# Patient Record
Sex: Male | Born: 1990
Health system: Southern US, Community
[De-identification: ages and names within clinical notes are randomized; demographics above are authoritative.]

## PROBLEM LIST (undated history)

## (undated) DIAGNOSIS — F1410 Cocaine abuse, uncomplicated: Secondary | ICD-10-CM

## (undated) DIAGNOSIS — F32A Depression, unspecified: Secondary | ICD-10-CM

## (undated) DIAGNOSIS — F329 Major depressive disorder, single episode, unspecified: Secondary | ICD-10-CM

## (undated) DIAGNOSIS — F419 Anxiety disorder, unspecified: Secondary | ICD-10-CM

## (undated) DIAGNOSIS — Z973 Presence of spectacles and contact lenses: Secondary | ICD-10-CM

## (undated) DIAGNOSIS — I639 Cerebral infarction, unspecified: Secondary | ICD-10-CM

## (undated) DIAGNOSIS — R519 Headache, unspecified: Secondary | ICD-10-CM

## (undated) DIAGNOSIS — K219 Gastro-esophageal reflux disease without esophagitis: Secondary | ICD-10-CM

## (undated) DIAGNOSIS — I729 Aneurysm of unspecified site: Secondary | ICD-10-CM

## (undated) HISTORY — PX: TOOTH EXTRACTION: SUR596

## (undated) HISTORY — PX: RECTAL SURGERY: SHX760

---
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
## 2012-10-25 ENCOUNTER — Emergency Department (HOSPITAL_COMMUNITY)
Admission: EM | Admit: 2012-10-25 | Discharge: 2012-10-25 | Disposition: A | Discharge status: Home / Self Care | Payer: Managed Care, Other (non HMO) | Attending: Emergency Medicine | Admitting: Emergency Medicine

## 2012-10-25 ENCOUNTER — Encounter (HOSPITAL_COMMUNITY): Payer: Self-pay | Admitting: Emergency Medicine

## 2012-10-25 DIAGNOSIS — F17200 Nicotine dependence, unspecified, uncomplicated: Secondary | ICD-10-CM | POA: Insufficient documentation

## 2012-10-25 DIAGNOSIS — H5789 Other specified disorders of eye and adnexa: Secondary | ICD-10-CM | POA: Insufficient documentation

## 2012-10-25 DIAGNOSIS — R0981 Nasal congestion: Secondary | ICD-10-CM

## 2012-10-25 DIAGNOSIS — J3489 Other specified disorders of nose and nasal sinuses: Secondary | ICD-10-CM | POA: Insufficient documentation

## 2012-10-25 DIAGNOSIS — H579 Unspecified disorder of eye and adnexa: Secondary | ICD-10-CM | POA: Insufficient documentation

## 2012-10-25 MED ORDER — CROMOLYN SODIUM 5.2 MG/ACT NA AERS: 1.0000 | INHALATION_SPRAY | Freq: Four times a day (QID) | NASAL | Status: DC

## 2012-10-25 MED ORDER — CETIRIZINE HCL 10 MG PO TABS: 10.0000 mg | ORAL_TABLET | Freq: Every day | ORAL | Status: DC

## 2012-10-25 NOTE — Progress Notes (Signed)
[facility] ED CM noted patient with insurance coverage but no PCP listed.  Spoke with patient who confirms no PCP. CM spoke with patient on how to obtain an in network PCP with insurance coverage via the customer service number on back of insurance card or web site.  Reinforced importance of following up with PCP as outpatient.

## 2012-10-25 NOTE — ED Provider Notes (Signed)
History     CSN: [id number]  Arrival date & time 10/25/12  1249   First MD Initiated Contact with Patient 10/25/12 1350      Chief Complaint  Patient presents with  . Nasal Congestion    (Consider location/radiation/quality/duration/timing/severity/associated sxs/prior treatment) HPI  Emergency chief complaint of nasal congestion and for the past 2 days.  This is contradictory to nursing to see [place] notes.  Patient states that he has had nasal congestion and blockage with intermittent rhinorrhea.  He denies any sinus pressure or pain.  He denies any fevers, chills, nausea, vomiting arthralgias or myalgias.  Patient denies cough, sore throat, ear pain or discharge.  He has had some injury itchy watery eyes.  Denies any difficulty breathing or swallowing, wheezing or asthma.  She mentions that he would like screening for STDs, however he denies any symptoms at this time.  I discussed with the patient may followup at the health Department for STD check.  History reviewed. No pertinent past medical history.  Past Surgical History  Procedure Laterality Date  . Rectal surgery    . Tooth extraction      No family history on file.  History  Substance Use Topics  . Smoking status: Current Every Day Smoker -- 1.00 packs/day    Types: Cigarettes  . Smokeless tobacco: Not on file  . Alcohol Use: Yes      Review of Systems  Constitutional: Negative for fever and chills.  HENT: Positive for congestion and rhinorrhea. Negative for nosebleeds, trouble swallowing, neck pain, voice change, postnasal drip and sinus pressure.   Eyes: Positive for redness and itching. Negative for photophobia, pain, discharge and visual disturbance.  Respiratory: Negative for cough, shortness of breath and wheezing.   Cardiovascular: Negative for chest pain.  Gastrointestinal: Negative for nausea, vomiting, abdominal pain and diarrhea.  Musculoskeletal: Negative for arthralgias and gait problem.     Allergies  Review of patient's allergies indicates no known allergies.  Home Medications  No current outpatient prescriptions on file.  BP 127/73  Pulse 92  Temp(Src) 98.8 F (37.1 C) (Oral)  Resp 24  SpO2 99%  Physical Exam  Nursing note and vitals reviewed. Constitutional: He appears well-developed and well-nourished. No distress.  HENT:  Head: Normocephalic and atraumatic. No trismus in the jaw.  Nose: Rhinorrhea present. No mucosal edema or nasal deformity. No epistaxis.  No foreign bodies.  Mouth/Throat: Uvula is midline. Normal dentition. No edematous or dental caries.  Eyes: Conjunctivae and EOM are normal. Pupils are equal, round, and reactive to light. No scleral icterus.  Neck: Normal range of motion. Neck supple.  Cardiovascular: Normal rate, regular rhythm and normal heart sounds.   Pulmonary/Chest: Effort normal and breath sounds normal. No respiratory distress.  Abdominal: Soft. There is no tenderness.  Musculoskeletal: He exhibits no edema.  Neurological: He is alert.  Skin: Skin is warm and dry. He is not diaphoretic.  Psychiatric: His behavior is normal.    ED Course  Procedures (including critical care time)  Labs Reviewed - No data to display No results found.   1. Nasal congestion       MDM  2:17 PM BP 127/73  Pulse 92  Temp(Src) 98.8 F (37.1 C) (Oral)  Resp 24  SpO2 99% Patient with nasal congestion.  No other complaints at this time.  He does when testing for STD however has no penile discharge, testicular pain,, dysuria.  I've advised the patient he should followup at the health Department for  STD testing.  Patient understands and agrees with plan.  I will treat him for nasal allergies.  Patient may followup with PCP for further evaluation.  Cautions discussed.  The patient appears reasonably screened and/or stabilized for discharge and I doubt any other medical condition or other [facility] requiring further screening, evaluation, or  treatment in the ED at this time prior to discharge.          Arthor Captain, PA-C 10/25/12 1419

## 2012-10-25 NOTE — ED Notes (Signed)
Pt complains of nasal congestion x 1 week. Pt denies coughing anything up at this time. Pt denies fever, denies nausea or vomiting at this time.

## 2012-10-25 NOTE — ED Provider Notes (Signed)
Medical screening examination/treatment/procedure(s) were performed by non-physician practitioner and as supervising physician I was immediately available for consultation/collaboration.   Kc Sedlak L Stanislaus Kaltenbach, MD 10/25/12 1537 

## 2014-10-15 ENCOUNTER — Emergency Department (HOSPITAL_COMMUNITY)
Admission: EM | Admit: 2014-10-15 | Discharge: 2014-10-15 | Disposition: A | Discharge status: Home / Self Care | Payer: BLUE CROSS/BLUE SHIELD | Attending: Emergency Medicine | Admitting: Emergency Medicine

## 2014-10-15 ENCOUNTER — Encounter (HOSPITAL_COMMUNITY): Payer: Self-pay | Admitting: *Deleted

## 2014-10-15 ENCOUNTER — Emergency Department (HOSPITAL_COMMUNITY): Payer: BLUE CROSS/BLUE SHIELD

## 2014-10-15 DIAGNOSIS — Z720 Tobacco use: Secondary | ICD-10-CM | POA: Diagnosis not present

## 2014-10-15 DIAGNOSIS — J029 Acute pharyngitis, unspecified: Secondary | ICD-10-CM | POA: Insufficient documentation

## 2014-10-15 LAB — I-STAT CHEM 8, ED
BUN: 6 mg/dL (ref 6–23)
CHLORIDE: 101 mmol/L (ref 96–112)
Calcium, Ion: 1.14 mmol/L (ref 1.12–1.23)
Creatinine, Ser: 0.8 mg/dL (ref 0.50–1.35)
GLUCOSE: 95 mg/dL (ref 70–99)
HEMATOCRIT: 48 % (ref 39.0–52.0)
Hemoglobin: 16.3 g/dL (ref 13.0–17.0)
POTASSIUM: 3.8 mmol/L (ref 3.5–5.1)
Sodium: 141 mmol/L (ref 135–145)
TCO2: 26 mmol/L (ref 0–100)

## 2014-10-15 LAB — RAPID STREP SCREEN (MED CTR MEBANE ONLY): STREPTOCOCCUS, GROUP A SCREEN (DIRECT): NEGATIVE

## 2014-10-15 MED ORDER — CETIRIZINE HCL 10 MG PO TABS: 10.0000 mg | ORAL_TABLET | Freq: Every day | ORAL | Status: DC

## 2014-10-15 MED ORDER — IOHEXOL 300 MG/ML  SOLN
100.0000 mL | Freq: Once | INTRAMUSCULAR | Status: AC | PRN
  Administered 2014-10-15: 100 mL via INTRAVENOUS
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
## 2014-10-15 NOTE — Discharge Instructions (Signed)

## 2014-10-17 LAB — CULTURE, GROUP A STREP: Strep A Culture: NEGATIVE

## 2015-05-20 ENCOUNTER — Emergency Department (HOSPITAL_COMMUNITY)
Admission: EM | Admit: 2015-05-20 | Discharge: 2015-05-20 | Disposition: A | Discharge status: Home / Self Care | Payer: Managed Care, Other (non HMO) | Attending: Emergency Medicine | Admitting: Emergency Medicine

## 2015-05-20 ENCOUNTER — Encounter (HOSPITAL_COMMUNITY): Payer: Self-pay | Admitting: Emergency Medicine

## 2015-05-20 DIAGNOSIS — B029 Zoster without complications: Secondary | ICD-10-CM

## 2015-05-20 DIAGNOSIS — Z79899 Other long term (current) drug therapy: Secondary | ICD-10-CM | POA: Insufficient documentation

## 2015-05-20 DIAGNOSIS — F17210 Nicotine dependence, cigarettes, uncomplicated: Secondary | ICD-10-CM | POA: Insufficient documentation

## 2015-05-20 MED ORDER — VALACYCLOVIR HCL 1 G PO TABS: 1000.0000 mg | ORAL_TABLET | Freq: Three times a day (TID) | ORAL | Status: AC

## 2015-05-20 MED ORDER — TETRACAINE HCL 0.5 % OP SOLN
2.0000 [drp] | Freq: Once | OPHTHALMIC | Status: AC
  Administered 2015-05-20: 2 [drp] via OPHTHALMIC
  Filled 2015-05-20: qty 2

## 2015-05-20 MED ORDER — FLUORESCEIN SODIUM 1 MG OP STRP
1.0000 | ORAL_STRIP | Freq: Once | OPHTHALMIC | Status: AC
  Administered 2015-05-20: 1 via OPHTHALMIC
  Filled 2015-05-20: qty 1

## 2015-05-20 NOTE — ED Notes (Signed)
Right eye swelling since last night, no other complaints, A/O X4, ambulatory and in NAD

## 2015-05-20 NOTE — Discharge Instructions (Signed)

## 2015-05-20 NOTE — ED Provider Notes (Signed)
CSN: [id number]     Arrival date & time 05/20/15  1131 History  By signing my name below, I, Jacob Holder, attest that this documentation has been prepared under the direction and in the presence of Eyvonne Mechanic, PA-C Electronically Signed: Charline Bills, ED Scribe 05/20/2015 at 4:27 PM.   Chief Complaint  Patient presents with  . Eye Pain   The history is provided by the patient. No language interpreter was used.   HPI Comments: Jacob Holder is a 24 y.o. male who presents to the Emergency Department complaining of sudden onset of worsening right upper eyelid swelling onset yesterday morning. Pt initially noticed swelling to his right eyelid and an area to his right forehead upon waking 2 mornings ago. No treatments tried PTA. Pt denies associated itching or pain to the areas, fever, chills, nausea, vomiting. He reports h/o eczema and a h/o shingles on his ribs as a child.  History reviewed. No pertinent past medical history.  Past Surgical History  Procedure Laterality Date  . Rectal surgery    . Tooth extraction     No family history on file. Social History  Substance Use Topics  . Smoking status: Current Every Day Smoker -- 0.50 packs/day    Types: Cigarettes  . Smokeless tobacco: None  . Alcohol Use: Yes    Review of Systems  All other systems reviewed and are negative.  Allergies  Review of patient's allergies indicates no known allergies.  Home Medications   Prior to Admission medications   Medication Sig Start Date End Date Taking? Authorizing Provider  cetirizine (ZYRTEC ALLERGY) 10 MG tablet Take 1 tablet (10 mg total) by mouth daily. 10/15/14   Roxy Horseman, PA-C  cromolyn (NASALCROM) 5.2 MG/ACT nasal spray Place 1 spray into the nose 4 (four) times daily. 10/25/12   Arthor Captain, PA-C  valACYclovir (VALTREX) 1000 MG tablet Take 1 tablet (1,000 mg total) by mouth 3 (three) times daily. 05/20/15 06/03/15  Carlean Crowl, PA-C   BP 130/72 mmHg  Pulse 92   Temp(Src) 98.1 F (36.7 C) (Oral)  Resp 18  Ht  (1.803 m)  Wt 183 lb (83.008 kg)  BMI 25.53 kg/m2  SpO2 99% Physical Exam  Constitutional: He is oriented to person, place, and time. He appears well-developed and well-nourished. No distress.  HENT:  Head: Normocephalic and atraumatic.  Right Ear: Tympanic membrane normal.  Left Ear: Tympanic membrane normal.  No vesicle, facial droop, loss of sensation. No rash noted to the nose. Vision equal bilateral, no painful ocular movements, no obvious redness, swelling, lacrimal discharge   Eyes: Conjunctivae and EOM are normal.  Erythematous rash to the R eyelid and forehead. No other lesions noted. Specifically no lesions noted on the nose. Vision normal, bilateral.   Neck: Neck supple. No tracheal deviation present.  Cardiovascular: Normal rate.   Pulmonary/Chest: Effort normal. No respiratory distress.  Musculoskeletal: Normal range of motion.  Neurological: He is alert and oriented to person, place, and time.  Skin: Skin is warm and dry.  Psychiatric: He has a normal mood and affect. His behavior is normal.  Nursing note and vitals reviewed.  ED Course  Procedures (including critical care time)   DIAGNOSTIC STUDIES: Oxygen Saturation is 99% on RA, normal by my interpretation.    COORDINATION OF CARE:   Labs Review Labs Reviewed - No data to display  Imaging Review No results found. I have personally reviewed and evaluated these images and lab results as part of my medical  decision-making.   EKG Interpretation None      MDM   Final diagnoses:  Shingles    Labs:  Imaging:  Consults:  Therapeutics:   Discharge Meds:   Assessment/Plan: Patient presentation most consistent with shingles. Affected areas include eyelid and for head, the eyeball does not seem to be involved, for fluorescein stain with with Slim showed no dendritic pattern, or any abnormalities of the eye. Patient will be placed on Valtrex 3  times daily for 7 days, with primary care follow-up. If symptoms worsen including changes in vision, involvement of the nose, or spreading of rash patient is instructed to follow-up with optometrist immediately or the emergency room. Patient verbalized understanding and agreement for today's plan and had no further questions or concerns at time of discharge  I personally performed the services described in this documentation, which was scribed in my presence. The recorded information has been reviewed and is accurate.     Eyvonne MechanicJeffrey Aitana Burry, PA-C 05/20/15 1627  Gwyneth SproutWhitney Plunkett, MD 05/21/15 [phone]

## 2015-05-20 NOTE — ED Notes (Signed)
Declined W/C at D/C and was escorted to lobby by RN. 

## 2016-03-15 ENCOUNTER — Emergency Department (HOSPITAL_COMMUNITY)
Admission: EM | Admit: 2016-03-15 | Discharge: 2016-03-15 | Disposition: A | Discharge status: Home / Self Care | Payer: Managed Care, Other (non HMO) | Attending: Emergency Medicine | Admitting: Emergency Medicine

## 2016-03-15 ENCOUNTER — Encounter (HOSPITAL_COMMUNITY): Payer: Self-pay | Admitting: Emergency Medicine

## 2016-03-15 DIAGNOSIS — Z79899 Other long term (current) drug therapy: Secondary | ICD-10-CM | POA: Insufficient documentation

## 2016-03-15 DIAGNOSIS — B9789 Other viral agents as the cause of diseases classified elsewhere: Secondary | ICD-10-CM

## 2016-03-15 DIAGNOSIS — F17210 Nicotine dependence, cigarettes, uncomplicated: Secondary | ICD-10-CM | POA: Insufficient documentation

## 2016-03-15 DIAGNOSIS — J069 Acute upper respiratory infection, unspecified: Secondary | ICD-10-CM | POA: Insufficient documentation

## 2016-03-15 DIAGNOSIS — J029 Acute pharyngitis, unspecified: Secondary | ICD-10-CM

## 2016-03-15 NOTE — ED Provider Notes (Signed)
[facility] DEPT Provider Note   CSN: [id number] Arrival date & time: 03/15/16  1211  By signing my name below, I, Placido SouLogan Joldersma, attest that this documentation has been prepared under the direction and in the presence of Nivaan Dicenzo Camprubi-Soms, PA-C. Electronically Signed: Placido SouLogan Joldersma, ED Scribe. 03/15/16. 1:11 PM.   History   Chief Complaint Chief Complaint  Patient presents with  . Sore Throat    HPI HPI Comments: Jacob Holder is a 25 y.o. male who presents to the Emergency Department complaining of gradually worsening, 6/10 constant soreness in the left side of his throat radiating to L ear x 2 days. He states his pain worsens with swallowing. Pt reports an associated mild dry cough which began this morning. He has taken advil and cough drops with some mild relief. He denies drooling, inability of swallowing, trismus, ear drainage, rhinorrhea, fevers, chills, abd pain, CP, SOB, n/v/d/c, urinary symptoms, rashes, myalgias, arthralgias, numbness, tingling, or weakness. Denies sick contacts.   The history is provided by the patient and medical records. No language interpreter was used.  Sore Throat  This is a new problem. The current episode started more than 2 days ago. The problem occurs constantly. The problem has been gradually worsening. Pertinent negatives include no chest pain, no abdominal pain and no shortness of breath. The symptoms are aggravated by swallowing. Nothing relieves the symptoms. Treatments tried: Advil and cough medicine. The treatment provided mild relief.    History reviewed. No pertinent past medical history.  There are no active problems to display for this patient.   Past Surgical History:  Procedure Laterality Date  . RECTAL SURGERY    . TOOTH EXTRACTION       Home Medications    Prior to Admission medications   Medication Sig Start Date End Date Taking? Authorizing Provider  cetirizine (ZYRTEC ALLERGY) 10 MG tablet Take 1 tablet (10  mg total) by mouth daily. 10/15/14   Roxy Horsemanobert Browning, PA-C  cromolyn (NASALCROM) 5.2 MG/ACT nasal spray Place 1 spray into the nose 4 (four) times daily. 10/25/12   Arthor CaptainAbigail Harris, PA-C    Family History History reviewed. No pertinent family history.  Social History Social History  Substance Use Topics  . Smoking status: Current Every Day Smoker    Packs/day: 0.50    Types: Cigarettes  . Smokeless tobacco: Never Used  . Alcohol use Yes     Comment: from time to time     Allergies   Review of patient's allergies indicates no known allergies.   Review of Systems Review of Systems  Constitutional: Negative for chills and fever.  HENT: Positive for ear pain (radiating from throat) and sore throat. Negative for congestion, drooling, ear discharge, rhinorrhea and trouble swallowing.   Respiratory: Positive for cough. Negative for shortness of breath.   Cardiovascular: Negative for chest pain.  Gastrointestinal: Negative for abdominal pain, constipation, diarrhea, nausea and vomiting.  Genitourinary: Negative for dysuria and hematuria.  Musculoskeletal: Negative for arthralgias and myalgias.  Skin: Negative for color change and rash.  Allergic/Immunologic: Negative for immunocompromised state.  Neurological: Negative for weakness and numbness.  Psychiatric/Behavioral: Negative for confusion.   A complete 10 system review of systems was obtained and all systems are negative except as noted in the HPI and PMH.   Physical Exam Updated Vital Signs BP 120/76   Pulse 89   Temp 98.6 F (37 C) (Oral)   Resp 18   SpO2 98%   Physical Exam  Constitutional: He is oriented to  person, place, and time. Vital signs are normal. He appears well-developed and well-nourished.  Non-toxic appearance. No distress.  Afebrile, nontoxic, NAD  HENT:  Head: Normocephalic and atraumatic.  Right Ear: Hearing, tympanic membrane, external ear and ear canal normal.  Left Ear: Hearing, tympanic membrane,  external ear and ear canal normal.  Nose: Nose normal.  Mouth/Throat: Uvula is midline and mucous membranes are normal. No trismus in the jaw. No uvula swelling. Posterior oropharyngeal erythema present. No oropharyngeal exudate, posterior oropharyngeal edema or tonsillar abscesses. Tonsils are 1+ on the right. Tonsils are 1+ on the left. No tonsillar exudate.  Ears are clear bilaterally. Nose clear. Oropharynx without uvular swelling or deviation, no trismus or drooling, with 1+ bilateral tonsillar swelling and erythema, no exudates. No PTA or evidence of ludwig's.   Eyes: Conjunctivae and EOM are normal. Right eye exhibits no discharge. Left eye exhibits no discharge.  Neck: Normal range of motion. Neck supple.  Cardiovascular: Normal rate and intact distal pulses.   Pulmonary/Chest: Effort normal. No respiratory distress.  Abdominal: Normal appearance. He exhibits no distension.  Musculoskeletal: Normal range of motion.  Lymphadenopathy:    He has cervical adenopathy.  Shotty cervical LAD bilaterally with mild TTP.   Neurological: He is alert and oriented to person, place, and time. He has normal strength. No sensory deficit.  Skin: Skin is warm, dry and intact. No rash noted.  Psychiatric: He has a normal mood and affect.  Nursing note and vitals reviewed.    ED Treatments / Results  Labs (all labs ordered are listed, but only abnormal results are displayed) Labs Reviewed - No data to display  EKG  EKG Interpretation None       Radiology No results found.  Procedures Procedures  DIAGNOSTIC STUDIES: Oxygen Saturation is 98% on RA, normal by my interpretation.    COORDINATION OF CARE: 1:02 PM Discussed next steps with pt. Pt verbalized understanding and is agreeable with the plan.    Medications Ordered in ED Medications - No data to display   Initial Impression / Assessment and Plan / ED Course  I have reviewed the triage vital signs and the nursing  notes.  Pertinent labs & imaging results that were available during my care of the patient were reviewed by me and considered in my medical decision making (see chart for details).  Clinical Course    25 y.o. male here with sore throat, dry cough x 3 days. Pt is afebrile with a clear lung exam. Mild rhinorrhea. Throat with mild tonsillar swelling, no exudate, no evidence of PTA or ludwig's, handling secretions well. CENTOR score low, doubt need for strep testing. Likely viral URI. Pt is agreeable to symptomatic treatment with close follow up with campus clinic or health dept as needed but spoke at length about emergent changing or worsening of symptoms that should prompt return to ER. Pt voices understanding and is agreeable to plan. Stable at time of discharge.   I personally performed the services described in this documentation, which was scribed in my presence. The recorded information has been reviewed and is accurate.   Final Clinical Impressions(s) / ED Diagnoses   Final diagnoses:  Sore throat  Viral pharyngitis  Viral URI with cough    New Prescriptions New Prescriptions   No medications on file     Allen Derry, PA-C 03/15/16 1316    Donnetta Hutching, MD 03/16/16 1703

## 2016-03-15 NOTE — Discharge Instructions (Addendum)
Continue to stay well-hydrated. Gargle warm salt water and spit it out. Use chloraseptic spray as needed for sore throat. Continue to alternate between Tylenol and Ibuprofen for pain or fever. Use Mucinex for cough suppression/expectoration of mucus. Use netipot and flonase to help with nasal congestion. May consider over-the-counter Benadryl or other antihistamine to decrease secretions and for watery itchy eyes. Followup with your campus clinic or the health department in 5-7 days for recheck of ongoing symptoms. Return to emergency department for emergent changing or worsening of symptoms.

## 2016-03-15 NOTE — ED Triage Notes (Signed)
Pt from home with c/o worsening sore throat starting this past Sunday.  Pt denies fevers or chills, or any other symptoms.  NAD, A&O.

## 2016-03-15 NOTE — ED Notes (Signed)
C/O SORE THROAT X 3 DAYS. DENIES OTHER SX. NO EXUDATE NOTED IN THROAT.

## 2016-11-30 ENCOUNTER — Emergency Department (HOSPITAL_COMMUNITY)
Admission: EM | Admit: 2016-11-30 | Discharge: 2016-12-01 | Disposition: A | Discharge status: Home / Self Care | Payer: Managed Care, Other (non HMO) | Attending: Emergency Medicine | Admitting: Emergency Medicine

## 2016-11-30 ENCOUNTER — Encounter (HOSPITAL_COMMUNITY): Payer: Self-pay | Admitting: Emergency Medicine

## 2016-11-30 DIAGNOSIS — Z79899 Other long term (current) drug therapy: Secondary | ICD-10-CM | POA: Diagnosis not present

## 2016-11-30 DIAGNOSIS — F17210 Nicotine dependence, cigarettes, uncomplicated: Secondary | ICD-10-CM | POA: Diagnosis not present

## 2016-11-30 DIAGNOSIS — F329 Major depressive disorder, single episode, unspecified: Secondary | ICD-10-CM | POA: Diagnosis not present

## 2016-11-30 DIAGNOSIS — Z62810 Personal history of physical and sexual abuse in childhood: Secondary | ICD-10-CM | POA: Diagnosis not present

## 2016-11-30 DIAGNOSIS — R45851 Suicidal ideations: Secondary | ICD-10-CM | POA: Diagnosis present

## 2016-11-30 DIAGNOSIS — F32A Depression, unspecified: Secondary | ICD-10-CM

## 2016-11-30 LAB — RAPID URINE DRUG SCREEN, HOSP PERFORMED
Amphetamines: NOT DETECTED
BARBITURATES: NOT DETECTED
Benzodiazepines: NOT DETECTED
Cocaine: NOT DETECTED
Opiates: NOT DETECTED
Tetrahydrocannabinol: NOT DETECTED

## 2016-11-30 LAB — COMPREHENSIVE METABOLIC PANEL
ALBUMIN: 4 g/dL (ref 3.5–5.0)
ALK PHOS: 56 U/L (ref 38–126)
ALT: 17 U/L (ref 17–63)
AST: 20 U/L (ref 15–41)
Anion gap: 8 (ref 5–15)
BILIRUBIN TOTAL: 0.6 mg/dL (ref 0.3–1.2)
BUN: 6 mg/dL (ref 6–20)
CALCIUM: 9.2 mg/dL (ref 8.9–10.3)
CO2: 26 mmol/L (ref 22–32)
Chloride: 102 mmol/L (ref 101–111)
Creatinine, Ser: 0.76 mg/dL (ref 0.61–1.24)
GFR calc Af Amer: >60 mL/min (ref >60)
GLUCOSE: 94 mg/dL (ref 65–99)
POTASSIUM: 4 mmol/L (ref 3.5–5.1)
Sodium: 136 mmol/L (ref 135–145)
TOTAL PROTEIN: 9.3 g/dL — AB (ref 6.5–8.1)

## 2016-11-30 LAB — CBC
HCT: 50.7 % (ref 39.0–52.0)
Hemoglobin: 16.6 g/dL (ref 13.0–17.0)
MCH: 28.4 pg (ref 26.0–34.0)
MCHC: 32.7 g/dL (ref 30.0–36.0)
MCV: 86.8 fL (ref 78.0–100.0)
Platelets: 90 10*3/uL — ABNORMAL LOW (ref 150–400)
RBC: 5.84 MIL/uL — ABNORMAL HIGH (ref 4.22–5.81)
RDW: 13.3 % (ref 11.5–15.5)
WBC: 4.2 10*3/uL (ref 4.0–10.5)

## 2016-11-30 LAB — ETHANOL

## 2016-11-30 LAB — ACETAMINOPHEN LEVEL: Acetaminophen (Tylenol), Serum: <10 ug/mL — ABNORMAL LOW (ref 10–30)

## 2016-11-30 LAB — SALICYLATE LEVEL: Salicylate Lvl: <7 mg/dL (ref 2.8–30.0)

## 2016-11-30 MED ORDER — LORATADINE 10 MG PO TABS: 10.0000 mg | ORAL_TABLET | Freq: Every day | ORAL | Status: DC

## 2016-11-30 MED ORDER — LORATADINE 10 MG PO TABS: 10.0000 mg | ORAL_TABLET | Freq: Every day | ORAL | Status: DC | PRN

## 2016-11-30 MED ORDER — CROMOLYN SODIUM 5.2 MG/ACT NA AERS: 1.0000 | INHALATION_SPRAY | Freq: Four times a day (QID) | NASAL | Status: DC

## 2016-11-30 NOTE — ED Notes (Signed)
[facility] Contacted this Clinical research associatewriter to notify that they recommend outpatient follow up and DC. EDP made aware.

## 2016-11-30 NOTE — ED Notes (Signed)
Meal tray ordered 

## 2016-11-30 NOTE — ED Notes (Signed)
Family visiting pt and advised of visiting hours policy. Pt and family agree.

## 2016-11-30 NOTE — ED Notes (Signed)
Pt speaking with TTS 

## 2016-11-30 NOTE — ED Triage Notes (Signed)
Pt sts SI without a plan for years; pt sts never been seen for same

## 2016-11-30 NOTE — ED Notes (Signed)
Pt asking when discharge papers will be ready. Pt updated that r. nanavati is still in a procedure and will complete them as soon as he is available. Pt requesting to use the phone. Rn allowed. Pt asking how long it will take. RN verbalized that it is unsure. Pt asking to put on cllothes, RN stated not yet.
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
## 2016-12-01 NOTE — ED Notes (Signed)
Belongings given back to the pt upon discharge and pt signed inventory sheet.

## 2016-12-01 NOTE — ED Notes (Signed)
Pt verbalized understanding of discharge instructions and outpatient resources. Pt verbalized that he will contract for safety if he is feeling suicidal again. Pt denies SI at this time

## 2016-12-01 NOTE — ED Notes (Signed)
ED Provider at bedside. 

## 2016-12-01 NOTE — Discharge Instructions (Signed)
We saw you in the ER for your mental health concerns and had our behavioral health team evaluate you. The team feels comfortable sending you home, please follow the recommendations given to you by them and the follow-up and medications they have prescribed to you. Please refrain from substance abuse. Return to the ER if your symptoms worsen.  

## 2018-09-26 ENCOUNTER — Other Ambulatory Visit: Payer: Self-pay

## 2018-09-26 ENCOUNTER — Ambulatory Visit (HOSPITAL_COMMUNITY)
Admission: EM | Admit: 2018-09-26 | Discharge: 2018-09-26 | Disposition: A | Discharge status: Home / Self Care | Payer: Managed Care, Other (non HMO) | Attending: Family Medicine | Admitting: Family Medicine

## 2018-09-26 ENCOUNTER — Encounter (HOSPITAL_COMMUNITY): Payer: Self-pay | Admitting: Emergency Medicine

## 2018-09-26 DIAGNOSIS — H60501 Unspecified acute noninfective otitis externa, right ear: Secondary | ICD-10-CM

## 2018-09-26 MED ORDER — NAPROXEN 500 MG PO TABS: 500.0000 mg | ORAL_TABLET | Freq: Two times a day (BID) | ORAL | 0 refills | Status: DC

## 2018-09-26 MED ORDER — NEOMYCIN-POLYMYXIN-HC 3.5-10000-1 OT SUSP: 4.0000 [drp] | Freq: Three times a day (TID) | OTIC | 0 refills | Status: DC

## 2018-09-26 NOTE — Discharge Instructions (Addendum)
Rest and drink plenty of fluids Cortisporin ear drops prescribed.  Use as directed x 10 days.   Naproxen prescribed.  Use as needed for pain.  Avoid taking with other antiinflammatories.   Follow up with PCP or with [facility] if symptoms persists Return here or go to the ER if you have any new or worsening symptoms fever, chills, changes in hearing, muffled hearing, ear discharge, nausea, vomiting, cough, shortness of breath, chest pain, etc..Marland Kitchen

## 2018-09-26 NOTE — ED Triage Notes (Signed)
Pt sts right sided ear pain and pain into throat; pt sts pain when eating

## 2018-09-26 NOTE — ED Provider Notes (Signed)
[facility]   [id number] 09/26/18 Arrival Time: 1407  CC: EAR PAIN  SUBJECTIVE: History from: patient.  Jacob Holder is a 28 y.o. male who presents with right ear pain x 1 week.  Denies a trauma, or precipitating event, such as swimming or wearing ear plugs.  Denies alleviating factors.  Symptoms are made worse with swallowing.  Denies fever, chills, fatigue, sinus pain, rhinorrhea, ear discharge, SOB, wheezing, chest pain, nausea, changes in bowel or bladder habits.    ROS: As per HPI.  History reviewed. No pertinent past medical history. Past Surgical History:  Procedure Laterality Date  . RECTAL SURGERY    . TOOTH EXTRACTION     No Known Allergies No current facility-administered medications on file prior to encounter.    No current outpatient medications on file prior to encounter.   Social History   Socioeconomic History  . Marital status: Single    Spouse name: Not on file  . Number of children: Not on file  . Years of education: Not on file  . Highest education level: Not on file  Occupational History  . Not on file  Social Needs  . Financial resource strain: Not on file  . Food insecurity:    Worry: Not on file    Inability: Not on file  . Transportation needs:    Medical: Not on file    Non-medical: Not on file  Tobacco Use  . Smoking status: Current Every Day Smoker    Packs/day: 0.50    Types: Cigarettes  . Smokeless tobacco: Never Used  Substance and Sexual Activity  . Alcohol use: Yes    Comment: from time to time  . Drug use: No  . Sexual activity: Yes    Birth control/protection: Condom  Lifestyle  . Physical activity:    Days per week: Not on file    Minutes per session: Not on file  . Stress: Not on file  Relationships  . Social connections:    Talks on phone: Not on file    Gets together: Not on file    Attends religious service: Not on file    Active member of club or organization: Not on file    Attends meetings of  clubs or organizations: Not on file    Relationship status: Not on file  . Intimate partner violence:    Fear of current or ex partner: Not on file    Emotionally abused: Not on file    Physically abused: Not on file    Forced sexual activity: Not on file  Other Topics Concern  . Not on file  Social History Narrative  . Not on file   Family History  Problem Relation Age of Onset  . Healthy Mother     OBJECTIVE:  Vitals:   09/26/18 1429  BP: 103/76  Pulse: 93  Resp: 18  Temp: 98.2 F (36.8 C)  TempSrc: Oral  SpO2: 100%     General appearance: alert; appears mildly fatigued HEENT: Ears: RT EAC erythematous, LT EAC clear, TMs pearly gray with visible cone of light, without erythema, RT sided tragal tenderness, no mastoid tenderness or erythema; Eyes: PERRL, EOMI grossly; Nose: nares patent without rhinorrhea; Throat: oropharynx clear, tonsils not enlarged or erythematous, uvula midline Neck: supple without LAD Lungs: unlabored respirations, symmetrical air entry; cough: absent; no respiratory distress Heart: regular rate and rhythm.   Skin: warm and dry Psychological: alert and cooperative; normal mood and affect  ASSESSMENT & PLAN:  1.  Acute otitis externa of right ear, unspecified type     Meds ordered this encounter  Medications  . neomycin-polymyxin-hydrocortisone (CORTISPORIN) 3.5-10000-1 OTIC suspension    Sig: Place 4 drops into the right ear 3 (three) times daily for 10 days.    Dispense:  10 mL    Refill:  0    Order Specific Question:   Supervising Provider    Answer:   Eustace Moore [[id number]]  . naproxen (NAPROSYN) 500 MG tablet    Sig: Take 1 tablet (500 mg total) by mouth 2 (two) times daily.    Dispense:  20 tablet    Refill:  0    Order Specific Question:   Supervising Provider    Answer:   Eustace Moore [[id number]]    Rest and drink plenty of fluids Cortisporin ear drops prescribed.  Use as directed x 10 days.   Naproxen prescribed.   Use as needed for pain.  Avoid taking with other antiinflammatories.   Follow up with PCP or with [facility] if symptoms persists Return here or go to the ER if you have any new or worsening symptoms fever, chills, changes in hearing, muffled hearing, ear discharge, nausea, vomiting, cough, shortness of breath, chest pain, etc...  Reviewed expectations re: course of current medical issues. Questions answered. Outlined signs and symptoms indicating need for more acute intervention. Patient verbalized understanding. After Visit Summary given.         Rennis Harding, PA-C 09/26/18 1502

## 2018-10-04 ENCOUNTER — Encounter (HOSPITAL_COMMUNITY): Payer: Self-pay | Admitting: Emergency Medicine

## 2018-10-04 ENCOUNTER — Other Ambulatory Visit: Payer: Self-pay

## 2018-10-04 ENCOUNTER — Ambulatory Visit (HOSPITAL_COMMUNITY)
Admission: EM | Admit: 2018-10-04 | Discharge: 2018-10-04 | Disposition: A | Discharge status: Home / Self Care | Payer: Managed Care, Other (non HMO) | Attending: Family Medicine | Admitting: Family Medicine

## 2018-10-04 DIAGNOSIS — M542 Cervicalgia: Secondary | ICD-10-CM

## 2018-10-04 DIAGNOSIS — M79601 Pain in right arm: Secondary | ICD-10-CM

## 2018-10-04 MED ORDER — DICLOFENAC SODIUM 75 MG PO TBEC: 75.0000 mg | DELAYED_RELEASE_TABLET | Freq: Two times a day (BID) | ORAL | 0 refills | Status: DC

## 2018-10-04 NOTE — ED Notes (Signed)
Patient verbalizes understanding of discharge instructions. Opportunity for questioning and answers were provided. Patient discharged from [facility] by MD. 

## 2018-10-04 NOTE — ED Triage Notes (Signed)
Left chest around left arm and around the left back is painful.  Left hand gets numb intermittently.  No known injury.  Patient is left handed.   Works at a call center.   Noticed pain this morning.  Pain increased throughout the day  Denies cough Denies runny nose

## 2018-10-17 NOTE — ED Provider Notes (Signed)
[facility]   [id number] 10/04/18 Arrival Time: 1721  ASSESSMENT & PLAN:  1. Right arm pain   2. Neck pain    No suspicion of cardiac etiology. Likely MSK. Discussed.  Meds ordered this encounter  Medications  . diclofenac (VOLTAREN) 75 MG EC tablet    Sig: Take 1 tablet (75 mg total) by mouth 2 (two) times daily.    Dispense:  14 tablet    Refill:  0   Ensure adequate ROM as tolerated. Injuries all appear to be muscular in nature.  Follow-up Information    [facility].   Specialty:  Urgent Care Why:  As needed. Contact information: [address] [place] [address] [phone]         Will f/u with his doctor or here if not seeing significant improvement within one week.  Reviewed expectations re: course of current medical issues. Questions answered. Outlined signs and symptoms indicating need for more acute intervention. Patient verbalized understanding. After Visit Summary given.  SUBJECTIVE: History from: patient. Jacob Holder is a 28 y.o. male who presents with complaint of intermittent left upper back and left pectoral pain. Noticed this morning. Described as "sore, like an ache". Occasional "tingling" of L arm; transient. More persistent now. Worse with certain movements of LUE. No associated SOB/n/v/diaphoresis. No trauma/injury reported. Alleviating factors: have not been identified. No extremity weakness. No head injury reported. No abdominal pain. Normal bowel and bladder habits. Has not tried OTC tx.  ROS: As per HPI. All other systems negative    OBJECTIVE:  Vitals:   10/04/18 1755  BP: 131/66  Pulse: 92  Resp: 18  Temp: 99 F (37.2 C)  TempSrc: Oral  SpO2: 100%     GCS: 15  General appearance: alert; no distress HEENT: normocephalic; atraumatic; conjunctivae normal; no orbital bruising or tenderness to palpation; TMs normal; no bleeding from ears; oral mucosa normal Neck:  supple with FROM but moves slowly; no midline tenderness; does have tenderness of cervical musculature extending over trapezius distribution only on the left Lungs: clear to auscultation bilaterally; unlabored Heart: regular rate and rhythm Chest wall: without tenderness to palpation; without bruising Abdomen: soft, non-tender; no bruising Back: no midline tenderness; without tenderness to palpation of thoracic/lumbar paraspinal musculature Extremities: moves all extremities normally; no edema; symmetrical with no gross deformities CV: brisk extremity capillary refill of LUE; 2+ radial pulse of LUE. Skin: warm and dry Neurologic: normal gait; normal reflexes of RUE and LUE; normal sensation of RUE and LUE; normal strength of RUE and LUE Psychological: alert and cooperative; normal mood and affect   No Known Allergies   PMH: No cardiac history.  Past Surgical History:  Procedure Laterality Date  . RECTAL SURGERY    . TOOTH EXTRACTION     Family History  Problem Relation Age of Onset  . Healthy Mother    Social History   Socioeconomic History  . Marital status: Single    Spouse name: Not on file  . Number of children: Not on file  . Years of education: Not on file  . Highest education level: Not on file  Occupational History  . Not on file  Social Needs  . Financial resource strain: Not on file  . Food insecurity:    Worry: Not on file    Inability: Not on file  . Transportation needs:    Medical: Not on file    Non-medical: Not on file  Tobacco Use  .  Smoking status: Current Every Day Smoker    Packs/day: 0.50    Types: Cigarettes  . Smokeless tobacco: Never Used  Substance and Sexual Activity  . Alcohol use: Yes    Comment: from time to time  . Drug use: No  . Sexual activity: Yes    Birth control/protection: Condom  Lifestyle  . Physical activity:    Days per week: Not on file    Minutes per session: Not on file  . Stress: Not on file  Relationships  .  Social connections:    Talks on phone: Not on file    Gets together: Not on file    Attends religious service: Not on file    Active member of club or organization: Not on file    Attends meetings of clubs or organizations: Not on file    Relationship status: Not on file  Other Topics Concern  . Not on file  Social History Narrative  . Not on file          Mardella LaymanHagler, Esiah Bazinet, MD 10/17/18 [phone]

## 2018-11-30 ENCOUNTER — Encounter (HOSPITAL_COMMUNITY): Payer: Self-pay | Admitting: Emergency Medicine

## 2018-11-30 ENCOUNTER — Other Ambulatory Visit: Payer: Self-pay

## 2018-11-30 ENCOUNTER — Emergency Department (HOSPITAL_COMMUNITY)
Admission: EM | Admit: 2018-11-30 | Discharge: 2018-11-30 | Disposition: A | Discharge status: Home / Self Care | Payer: Managed Care, Other (non HMO) | Attending: Emergency Medicine | Admitting: Emergency Medicine

## 2018-11-30 ENCOUNTER — Emergency Department (HOSPITAL_COMMUNITY): Payer: Managed Care, Other (non HMO)

## 2018-11-30 DIAGNOSIS — F17210 Nicotine dependence, cigarettes, uncomplicated: Secondary | ICD-10-CM | POA: Insufficient documentation

## 2018-11-30 DIAGNOSIS — R51 Headache: Secondary | ICD-10-CM | POA: Insufficient documentation

## 2018-11-30 DIAGNOSIS — Z791 Long term (current) use of non-steroidal anti-inflammatories (NSAID): Secondary | ICD-10-CM | POA: Insufficient documentation

## 2018-11-30 DIAGNOSIS — R519 Headache, unspecified: Secondary | ICD-10-CM

## 2018-11-30 LAB — SEDIMENTATION RATE: Sed Rate: 10 mm/hr (ref 0–16)

## 2018-11-30 LAB — BASIC METABOLIC PANEL
Anion gap: 9 (ref 5–15)
BUN: 8 mg/dL (ref 6–20)
CO2: 26 mmol/L (ref 22–32)
Calcium: 8.9 mg/dL (ref 8.9–10.3)
Chloride: 100 mmol/L (ref 98–111)
Creatinine, Ser: 0.96 mg/dL (ref 0.61–1.24)
GFR calc Af Amer: >60 mL/min (ref >60)
GFR calc non Af Amer: >60 mL/min (ref >60)
Glucose, Bld: 85 mg/dL (ref 70–99)
Potassium: 3.7 mmol/L (ref 3.5–5.1)
Sodium: 135 mmol/L (ref 135–145)

## 2018-11-30 LAB — CBC WITH DIFFERENTIAL/PLATELET
Abs Immature Granulocytes: 0.02 10*3/uL (ref 0.00–0.07)
Basophils Absolute: 0 10*3/uL (ref 0.0–0.1)
Basophils Relative: 0 %
Eosinophils Absolute: 0.2 10*3/uL (ref 0.0–0.5)
Eosinophils Relative: 2 %
HCT: 46.6 % (ref 39.0–52.0)
Hemoglobin: 15.2 g/dL (ref 13.0–17.0)
Immature Granulocytes: 0 %
Lymphocytes Relative: 30 %
Lymphs Abs: 2 10*3/uL (ref 0.7–4.0)
MCH: 28.6 pg (ref 26.0–34.0)
MCHC: 32.6 g/dL (ref 30.0–36.0)
MCV: 87.8 fL (ref 80.0–100.0)
Monocytes Absolute: 0.6 10*3/uL (ref 0.1–1.0)
Monocytes Relative: 9 %
Neutro Abs: 3.9 10*3/uL (ref 1.7–7.7)
Neutrophils Relative %: 59 %
Platelets: 108 10*3/uL — ABNORMAL LOW (ref 150–400)
RBC: 5.31 MIL/uL (ref 4.22–5.81)
RDW: 13.1 % (ref 11.5–15.5)
WBC: 6.7 10*3/uL (ref 4.0–10.5)
nRBC: 0 % (ref 0.0–0.2)

## 2018-11-30 LAB — C-REACTIVE PROTEIN: CRP: <0.8 mg/dL (ref <1.0)

## 2018-11-30 MED ORDER — PROCHLORPERAZINE EDISYLATE 10 MG/2ML IJ SOLN
10.0000 mg | Freq: Once | INTRAMUSCULAR | Status: AC
  Administered 2018-11-30: 10 mg via INTRAVENOUS
  Filled 2018-11-30: qty 2

## 2018-11-30 MED ORDER — ONDANSETRON 4 MG PO TBDP: 4.0000 mg | ORAL_TABLET | Freq: Three times a day (TID) | ORAL | 0 refills | Status: DC | PRN

## 2018-11-30 MED ORDER — DIPHENHYDRAMINE HCL 50 MG/ML IJ SOLN
25.0000 mg | Freq: Once | INTRAMUSCULAR | Status: AC
  Administered 2018-11-30: 18:00:00 25 mg via INTRAVENOUS
  Filled 2018-11-30: qty 1

## 2018-11-30 MED ORDER — SODIUM CHLORIDE 0.9 % IV BOLUS
1000.0000 mL | Freq: Once | INTRAVENOUS | Status: AC
  Administered 2018-11-30: 1000 mL via INTRAVENOUS

## 2018-11-30 MED ORDER — DEXAMETHASONE SODIUM PHOSPHATE 10 MG/ML IJ SOLN
10.0000 mg | Freq: Once | INTRAMUSCULAR | Status: AC
  Administered 2018-11-30: 10 mg via INTRAVENOUS
  Filled 2018-11-30: qty 1

## 2018-11-30 NOTE — ED Triage Notes (Signed)
Given Toradol and Zofran helped and then came back
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
## 2018-11-30 NOTE — Discharge Instructions (Addendum)
You can alternate 600 mg of ibuprofen and [phone] mg of Tylenol every 3 hours as needed for pain. Do not exceed 4000 mg of Tylenol daily.  Take ibuprofen with food to avoid upset stomach issues.  Take Zofran every 8 hours as needed for nausea and vomiting.  Let this medicine dissolve under your tongue and wait around 10 to 15 minutes before having anything to eat or drink.  Drink plenty of fluids and get plenty of rest.  Follow-up with your primary care physician or neurologist for reevaluation of your headaches.  We discussed the possibility of a brain bleed called a subarachnoid hemorrhage.  We discussed the risks and benefits and you decided that you would like to hold off on a spinal tap at this time.  Please return to the emergency department immediately if any concerning signs or symptoms develop such as worsening headache, altered mental status, persistent vomiting, fevers

## 2018-11-30 NOTE — ED Notes (Addendum)
Offered water, juice, and cheese/crackers... able to tolerate.. Ambulated through hall still feeling headache only minor compared to earlier.

## 2018-11-30 NOTE — ED Triage Notes (Signed)
Pt. Stated, Jacob Holder had the worse headache , I went to UC and it went away and then when I woke up it came back again even worse.

## 2019-01-21 ENCOUNTER — Emergency Department (HOSPITAL_COMMUNITY)
Admission: EM | Admit: 2019-01-21 | Discharge: 2019-01-21 | Disposition: A | Discharge status: Home / Self Care | Payer: Managed Care, Other (non HMO) | Attending: Emergency Medicine | Admitting: Emergency Medicine

## 2019-01-21 ENCOUNTER — Encounter (HOSPITAL_COMMUNITY): Payer: Self-pay | Admitting: *Deleted

## 2019-01-21 ENCOUNTER — Other Ambulatory Visit: Payer: Self-pay

## 2019-01-21 DIAGNOSIS — Z79899 Other long term (current) drug therapy: Secondary | ICD-10-CM | POA: Diagnosis not present

## 2019-01-21 DIAGNOSIS — G43909 Migraine, unspecified, not intractable, without status migrainosus: Secondary | ICD-10-CM | POA: Diagnosis not present

## 2019-01-21 DIAGNOSIS — H53142 Visual discomfort, left eye: Secondary | ICD-10-CM | POA: Insufficient documentation

## 2019-01-21 DIAGNOSIS — F419 Anxiety disorder, unspecified: Secondary | ICD-10-CM | POA: Insufficient documentation

## 2019-01-21 DIAGNOSIS — F17210 Nicotine dependence, cigarettes, uncomplicated: Secondary | ICD-10-CM | POA: Insufficient documentation

## 2019-01-21 DIAGNOSIS — R51 Headache: Secondary | ICD-10-CM | POA: Diagnosis present

## 2019-01-21 MED ORDER — SODIUM CHLORIDE 0.9 % IV BOLUS
1000.0000 mL | Freq: Once | INTRAVENOUS | Status: AC
  Administered 2019-01-21: 1000 mL via INTRAVENOUS

## 2019-01-21 MED ORDER — METOCLOPRAMIDE HCL 5 MG/ML IJ SOLN
10.0000 mg | Freq: Once | INTRAMUSCULAR | Status: AC
  Administered 2019-01-21: 08:00:00 10 mg via INTRAVENOUS
  Filled 2019-01-21: qty 2

## 2019-01-21 MED ORDER — DIPHENHYDRAMINE HCL 50 MG/ML IJ SOLN
25.0000 mg | Freq: Once | INTRAMUSCULAR | Status: AC
  Administered 2019-01-21: 08:00:00 25 mg via INTRAVENOUS
  Filled 2019-01-21: qty 1

## 2019-01-21 MED ORDER — METHYLPREDNISOLONE SODIUM SUCC 125 MG IJ SOLR
125.0000 mg | Freq: Once | INTRAMUSCULAR | Status: AC
  Administered 2019-01-21: 125 mg via INTRAVENOUS
  Filled 2019-01-21: qty 2

## 2019-01-21 MED ORDER — KETOROLAC TROMETHAMINE 30 MG/ML IJ SOLN
30.0000 mg | Freq: Once | INTRAMUSCULAR | Status: AC
  Administered 2019-01-21: 08:00:00 30 mg via INTRAVENOUS
  Filled 2019-01-21: qty 1

## 2019-01-21 NOTE — ED Notes (Signed)
Pt reports that he needs to leave at this time. Reports his head feels better at present time. MD made aware by this RN.

## 2019-01-21 NOTE — ED Triage Notes (Signed)
Pt reports waking up with severe pain to entire left side of face and unable to see from left eye. Had similar episode one month ago but effected right side of face. Was seen here and no diagnosis made. No acute distress noted at triage, no neuro deficits noted.

## 2019-01-21 NOTE — ED Provider Notes (Signed)
[facility] EMERGENCY DEPARTMENT Provider Note   CSN: [id number] Arrival date & time: 01/21/19  7425    History   Chief Complaint Chief Complaint  Patient presents with  . Eye Problem  . Headache    HPI Jacob Holder is a 28 y.o. male.     HPI Patient states he woke up at 2 AM with left-sided headache.  States he has severe photophobia especially in the left eye.  Has associated nausea and one episode of vomiting.  Admits to drinking alcohol last night.  Denies any other drugs.  States had similar headache several weeks ago was evaluated in the emergency department.  Had a negative CT head at that time.  States he has been under increased stress due to the pandemic.  Denies any fever or chills.  No recent head trauma.  No focal weakness or numbness.  Denies loss of vision in the left eye as reported in the nursing note.  States he normally wears contacts and took his contacts out and has blurred vision due to his normal poor vision.  History reviewed. No pertinent past medical history.  There are no active problems to display for this patient.   Past Surgical History:  Procedure Laterality Date  . RECTAL SURGERY    . TOOTH EXTRACTION          Home Medications    Prior to Admission medications   Medication Sig Start Date End Date Taking? Authorizing Provider  diclofenac (VOLTAREN) 75 MG EC tablet Take 1 tablet (75 mg total) by mouth 2 (two) times daily. 10/04/18   Vanessa Kick, MD  ondansetron (ZOFRAN ODT) 4 MG disintegrating tablet Take 1 tablet (4 mg total) by mouth every 8 (eight) hours as needed for nausea or vomiting. 11/30/18   Renita Papa, PA-C    Family History Family History  Problem Relation Age of Onset  . Healthy Mother     Social History Social History   Tobacco Use  . Smoking status: Current Every Day Smoker    Packs/day: 0.50    Types: Cigarettes  . Smokeless tobacco: Never Used  Substance Use Topics  . Alcohol use: Yes   Comment: from time to time  . Drug use: No     Allergies   Patient has no known allergies.   Review of Systems Review of Systems  Constitutional: Negative for chills and fever.  HENT: Negative for sore throat and trouble swallowing.   Eyes: Positive for photophobia. Negative for pain and visual disturbance.  Respiratory: Negative for cough and shortness of breath.   Cardiovascular: Negative for chest pain, palpitations and leg swelling.  Gastrointestinal: Positive for nausea and vomiting. Negative for abdominal pain, constipation and diarrhea.  Musculoskeletal: Negative for back pain, myalgias and neck pain.  Skin: Negative for rash and wound.  Neurological: Positive for headaches. Negative for dizziness, weakness, light-headedness and numbness.  Psychiatric/Behavioral: Positive for sleep disturbance. The patient is nervous/anxious.   All other systems reviewed and are negative.    Physical Exam Updated Vital Signs BP 122/87   Pulse (!) 119   Temp 98.7 F (37.1 C) (Oral)   Resp 20   SpO2 97%   Physical Exam Vitals signs and nursing note reviewed.  Constitutional:      Appearance: Normal appearance. He is well-developed.  HENT:     Head: Normocephalic and atraumatic.     Nose: Nose normal.     Mouth/Throat:     Mouth: Mucous membranes are  moist.  Eyes:     Extraocular Movements: Extraocular movements intact.     Pupils: Pupils are equal, round, and reactive to light.     Comments: Mild conjunctival irritation bilaterally.  Patient with direct photophobia.  Neck:     Musculoskeletal: Normal range of motion and neck supple.     Comments: No meningismus Cardiovascular:     Rate and Rhythm: Normal rate and regular rhythm.  Pulmonary:     Effort: Pulmonary effort is normal.     Breath sounds: Normal breath sounds.  Abdominal:     General: Bowel sounds are normal.     Palpations: Abdomen is soft.     Tenderness: There is no abdominal tenderness. There is no guarding  or rebound.  Musculoskeletal: Normal range of motion.        General: No tenderness.  Skin:    General: Skin is warm and dry.     Findings: No erythema or rash.  Neurological:     General: No focal deficit present.     Mental Status: He is alert and oriented to person, place, and time.     Comments: Patient is alert and oriented x3 with clear, goal oriented speech. Patient has 5/5 motor in all extremities. Sensation is intact to light touch. Bilateral finger-to-nose is normal with no signs of dysmetria. Patient has a normal gait and walks without assistance.  Psychiatric:     Comments: Very anxious appearing      ED Treatments / Results  Labs (all labs ordered are listed, but only abnormal results are displayed) Labs Reviewed - No data to display  EKG None  Radiology No results found.  Procedures Procedures (including critical care time)  Medications Ordered in ED Medications  ketorolac (TORADOL) 30 MG/ML injection 30 mg (30 mg Intravenous Given 01/21/19 0814)  sodium chloride 0.9 % bolus 1,000 mL (1,000 mLs Intravenous New Bag/Given 01/21/19 0815)  metoCLOPramide (REGLAN) injection 10 mg (10 mg Intravenous Given 01/21/19 0815)  diphenhydrAMINE (BENADRYL) injection 25 mg (25 mg Intravenous Given 01/21/19 0815)  methylPREDNISolone sodium succinate (SOLU-MEDROL) 125 mg/2 mL injection 125 mg (125 mg Intravenous Given 01/21/19 0815)     Initial Impression / Assessment and Plan / ED Course  I have reviewed the triage vital signs and the nursing notes.  Pertinent labs & imaging results that were available during my care of the patient were reviewed by me and considered in my medical decision making (see chart for details).       Patient states he is feeling much better at this time.  Asking to be discharged home.  Continues to have a normal neurologic exam.  Headaches are likely due to migraines.  Patient does have history of depression and is very anxious.  This is likely  contributing to his symptoms.  Will give outpatient resources.  Return precautions have been given.   Final Clinical Impressions(s) / ED Diagnoses   Final diagnoses:  Migraine without status migrainosus, not intractable, unspecified migraine type  Anxiety    ED Discharge Orders    None       Loren RacerYelverton, Pax Reasoner, MD 01/21/19 [phone]

## 2019-01-21 NOTE — ED Notes (Signed)
Patient walked outside to wait.

## 2019-01-21 NOTE — ED Notes (Signed)
Patient verbalizes understanding of discharge instructions. Opportunity for questioning and answers were provided. Armband removed by staff, pt discharged from ED.  

## 2019-02-11 ENCOUNTER — Encounter: Payer: Self-pay | Admitting: Neurology

## 2019-02-18 NOTE — Progress Notes (Signed)
Virtual Visit via Video Note The purpose of this virtual visit is to provide medical care while limiting exposure to the novel coronavirus.    Consent was obtained for video visit:  Yes Answered questions that patient had about telehealth interaction:  Yes I discussed the limitations, risks, security and privacy concerns of performing an evaluation and management service by telemedicine. I also discussed with the patient that there may be a patient responsible charge related to this service. The patient expressed understanding and agreed to proceed.  Pt location: Home Physician Location: Home Name of referring provider:  Julianne Rice, MD I connected with Jacob Holder at patients initiation/request on 02/20/2019 at  7:50 AM EDT by video enabled telemedicine application and verified that I am speaking with the correct person using two identifiers. Pt MRN:  [id number] Pt DOB:  09-Sep-1990 Video Participants:  Jacob Holder   History of Present Illness:  Jacob Holder is a 28 year old male who presents for headaches.  History supplemented by ED notes.  He went to the ED on 11/30/18 after waking up at 2:30 AM with severe right-sided pounding headache.  He was unable to rest.  He paced around and took a shower.  On side of headache, eye is blood shot and face looks a little drooping.  He felt nauseous and vomited.  Associated with nausea and vomiting.  He went to [facility] to get pain medication.  He got in his care and passed out.  He woke up 2 hours later in the [place] parking lot.  Headache persisted.  He went to the ED.  CT of head was personally reviewed and negative for acute intracranial abnormality.  Vitals and labs revealed no evidence of infection. ESR and CRP were 10 and <0.8 respectively.  LP was discussed but patient deferred.  He was treated with migraine cocktail which helped and discharged.  It lasted until 8 or 9 PM  A week later, he had another episode except involving the  left side of his head.  He didn't go to the ED.  It resolved after several hours.  He returned to the ED on 01/21/19 after waking up at 2 AM with severe left sided headache again.  He was again treated with a headache cocktail and discharged.  Since then, he has had a constant moderate and throbbing headache, mostly left-sided but does switch to right side.  These headaches are more severe every 2 days or so.  He has missed total of 2 weeks of work.  No history of headaches.  No known triggers.  The first episode, he had some alcohol but the subsequent events not associated with alcohol.  Nothing relieves it.  Current NSAIDS:  Advil Migraine.  Takes daily with minimal efficacy.    Caffeine:  Once in awhile Alcohol:  occasional Smoker:  cigarettes Diet:  Hydrates. Exercise:  walks Depression:  yes; Anxiety:  yes Other pain:  no Sleep hygiene:  Usually okay Family history of headache: no He works for [employer]   Past Medical History: No past medical history on file.  Medications: Outpatient Encounter Medications as of 02/20/2019  Medication Sig  . diclofenac (VOLTAREN) 75 MG EC tablet Take 1 tablet (75 mg total) by mouth 2 (two) times daily.  . ondansetron (ZOFRAN ODT) 4 MG disintegrating tablet Take 1 tablet (4 mg total) by mouth every 8 (eight) hours as needed for nausea or vomiting.   No facility-administered encounter medications on file as of 02/20/2019.  Allergies: No Known Allergies  Family History: Family History  Problem Relation Age of Onset  . Healthy Mother     Social History: Social History   Socioeconomic History  . Marital status: Single    Spouse name: Not on file  . Number of children: Not on file  . Years of education: Not on file  . Highest education level: Not on file  Occupational History  . Not on file  Social Needs  . Financial resource strain: Not on file  . Food insecurity    Worry: Not on file    Inability: Not on file   . Transportation needs    Medical: Not on file    Non-medical: Not on file  Tobacco Use  . Smoking status: Current Every Day Smoker    Packs/day: 0.50    Types: Cigarettes  . Smokeless tobacco: Never Used  Substance and Sexual Activity  . Alcohol use: Yes    Comment: from time to time  . Drug use: No  . Sexual activity: Yes    Birth control/protection: Condom  Lifestyle  . Physical activity    Days per week: Not on file    Minutes per session: Not on file  . Stress: Not on file  Relationships  . Social Herbalist on phone: Not on file    Gets together: Not on file    Attends religious service: Not on file    Active member of club or organization: Not on file    Attends meetings of clubs or organizations: Not on file    Relationship status: Not on file  . Intimate partner violence    Fear of current or ex partner: Not on file    Emotionally abused: Not on file    Physically abused: Not on file    Forced sexual activity: Not on file  Other Topics Concern  . Not on file  Social History Narrative  . Not on file    Observations/Objective:   Temperature 98 F (36.7 C), height 5' 9" (1.753 m), weight 170 lb (77.1 kg). No acute distress.  Alert and oriented.  Speech fluent and not dysarthric.  Language intact.  Eyes orthophoric on primary gaze.  Face symmetric.  Assessment and Plan:   Intractable cluster headaches, new onset.  Now with daily headaches.  1.  MRI and MRA of head to rule out secondary etiology 2.  Start verapamil 31m three times daily 3.  I want to prescribe a triptan for abortive therapy.  However, I want to make sure there are no intracranial vascular abnormalities such as aneurysm, that may be contraindicated.  For that reason, we will expedite MRI/MRA. 4.  Limit use of pain relievers to no more than 2 days out of week to prevent risk of rebound or medication-overuse headache. 5.  Keep headache diary 6.  [place]  work 7.  Follow up in 4 months.  Follow Up Instructions:    -I discussed the assessment and treatment plan with the patient. The patient was provided an opportunity to ask questions and all were answered. The patient agreed with the plan and demonstrated an understanding of the instructions.   The patient was advised to call back or seek an in-person evaluation if the symptoms worsen or if the condition fails to improve as anticipated.   ADudley Major DO

## 2019-02-20 ENCOUNTER — Telehealth: Payer: Self-pay

## 2019-02-20 ENCOUNTER — Encounter: Payer: Self-pay | Admitting: Neurology

## 2019-02-20 ENCOUNTER — Telehealth (INDEPENDENT_AMBULATORY_CARE_PROVIDER_SITE_OTHER): Payer: Managed Care, Other (non HMO) | Admitting: Neurology

## 2019-02-20 ENCOUNTER — Other Ambulatory Visit: Payer: Self-pay

## 2019-02-20 VITALS — Temp 98.0°F | Ht 69.0 in | Wt 170.0 lb

## 2019-02-20 DIAGNOSIS — R519 Headache, unspecified: Secondary | ICD-10-CM

## 2019-02-20 DIAGNOSIS — G44001 Cluster headache syndrome, unspecified, intractable: Secondary | ICD-10-CM | POA: Diagnosis not present

## 2019-02-20 MED ORDER — VERAPAMIL HCL 80 MG PO TABS: 80.0000 mg | ORAL_TABLET | Freq: Three times a day (TID) | ORAL | 3 refills | Status: DC

## 2019-02-20 NOTE — Addendum Note (Signed)
Addended by: Clois Comber on: 02/20/2019 09:10 AM   Modules accepted: Orders

## 2019-02-20 NOTE — Patient Instructions (Signed)
1.  Will get MRI and MRA of brain 2.  Start verapamil 80mg  three times daily 3.  Once I see the MRI results, we will start a medication to break the headaches. 4.  Limit use of pain relievers to no more than 2 days out of week to prevent risk of rebound or medication-overuse headache. 5.  Follow up in 4 months.   Cluster Headache Cluster headaches are deeply painful. They normally occur on one side of your head, but they may switch sides. Often, cluster headaches:  Are severe.  Happen often for a few weeks or months and then go away for a while.  Last from 15 minutes to 3 hours.  Happen at the same time each day.  Happen at night.  Happen many times a day. Follow these instructions at home:        Follow instructions from your doctor to care for yourself at home:  Go to bed at the same time each night. Get the same amount of sleep every night.  Avoid alcohol.  Stop smoking if you smoke. This includes cigarettes and e-cigarettes.  Take over-the-counter and prescription medicines only as told by your doctor.  Do not drive or use heavy machinery while taking prescription pain medicine.  Use oxygen as told by your doctor.  Exercise regularly.  Eat a healthy diet.  Write down when each headache happened, what kind of pain you had, how bad your pain was, and what you tried to help your pain. This is called a headache diary. Use it as told by your doctor. Contact a doctor if:  Your headaches get worse or they happen more often.  Your medicines are not helping. Get help right away if:  You pass out (faint).  You get weak or lose feeling (have numbness) on one side of your body or face.  You see two of everything (double vision).  You feel sick to your stomach (nauseous) or you throw up (vomit), and you do not stop after many hours.  You have trouble with your balance or with walking.  You have trouble talking.  You have neck pain or stiffness.  You have a  fever. This information is not intended to replace advice given to you by your health care provider. Make sure you discuss any questions you have with your health care provider. Document Released: 07/28/2004 Document Revised: 09/15/2017 Document Reviewed: 02/26/2016 Elsevier Patient Education  2020 [employer].

## 2019-02-20 NOTE — Telephone Encounter (Signed)
Called [facility] to advise of STAT orders for Pt, spoke with Pam. She does not have any availability until 8/25, Tuesday at 9:20am

## 2019-02-22 ENCOUNTER — Ambulatory Visit
Admission: RE | Admit: 2019-02-22 | Discharge: 2019-02-22 | Disposition: A | Discharge status: Home / Self Care | Payer: Managed Care, Other (non HMO) | Source: Ambulatory Visit | Attending: Neurology | Admitting: Neurology

## 2019-02-22 DIAGNOSIS — R519 Headache, unspecified: Secondary | ICD-10-CM

## 2019-02-22 DIAGNOSIS — G44001 Cluster headache syndrome, unspecified, intractable: Secondary | ICD-10-CM

## 2019-02-25 ENCOUNTER — Other Ambulatory Visit: Payer: Self-pay | Admitting: *Deleted

## 2019-02-25 ENCOUNTER — Telehealth: Payer: Self-pay | Admitting: Neurology

## 2019-02-25 DIAGNOSIS — R519 Headache, unspecified: Secondary | ICD-10-CM

## 2019-02-25 NOTE — Telephone Encounter (Signed)
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 No evidence of generalized vascular pathy   Marzetta Board called from [facility] at [phone]  Left message with after hour service on 02-22-19 @ 4:47 pm

## 2019-02-25 NOTE — Telephone Encounter (Signed)
-----   Message from Pieter Partridge, DO sent at 02/22/2019  7:44 PM EDT ----- I called and spoke with Mr. Aderman regarding the MRI results.  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  Although it doesn't look like an obvious aneurysm, I would like to refer him to Dr. Luanne Bras of endovascular radiology.

## 2019-02-25 NOTE — Telephone Encounter (Signed)
Pt needs to talk to someone about a referral and wanted to let us know paper work would be coming over to fill out for him

## 2019-02-25 NOTE — Telephone Encounter (Signed)
Placed order for IR (Dr. Estanislado Pandy) referral due to MRI showing bulbous basilar artery tip. Also left message with Anderson Malta in [place] office [phone]) to make sure referral was received.

## 2019-02-26 ENCOUNTER — Other Ambulatory Visit: Payer: Managed Care, Other (non HMO)

## 2019-02-26 ENCOUNTER — Telehealth (HOSPITAL_COMMUNITY): Payer: Self-pay

## 2019-02-26 ENCOUNTER — Other Ambulatory Visit (HOSPITAL_COMMUNITY): Payer: Self-pay | Admitting: Interventional Radiology

## 2019-02-26 DIAGNOSIS — G44001 Cluster headache syndrome, unspecified, intractable: Secondary | ICD-10-CM

## 2019-02-26 DIAGNOSIS — R519 Headache, unspecified: Secondary | ICD-10-CM

## 2019-02-26 NOTE — Telephone Encounter (Signed)
-----   Message from Ronney Lion, [place] sent at 02/26/2019  2:40 PM EDT ----- Lia Foyer,  I had Dr. Estanislado Pandy review. He would like to schedule patient for an image-guided diagnostic cerebral arteriogram first available. No need for consult prior unless requested by patient. Please schedule and please let me know if you have any questions.  Thanks! Ally ----- Message ----- From: Danielle Dess Sent: 02/26/2019  10:25 AM EDT To: Ronney Lion, PA-C  New referral from Dr. Tomi Likens. Please review. I will call pt to schedule.   Thanks,  Lia Foyer

## 2019-03-01 ENCOUNTER — Telehealth: Payer: Self-pay | Admitting: Neurology

## 2019-03-01 NOTE — Telephone Encounter (Signed)
Calling to see if we got the fax yesterday from them please call them back

## 2019-03-04 ENCOUNTER — Other Ambulatory Visit: Payer: Self-pay | Admitting: Physician Assistant

## 2019-03-05 ENCOUNTER — Other Ambulatory Visit: Payer: Self-pay

## 2019-03-05 ENCOUNTER — Other Ambulatory Visit: Payer: Self-pay | Admitting: Radiology

## 2019-03-05 ENCOUNTER — Ambulatory Visit (HOSPITAL_COMMUNITY)
Admission: RE | Admit: 2019-03-05 | Discharge: 2019-03-05 | Disposition: A | Discharge status: Home / Self Care | Payer: Managed Care, Other (non HMO) | Source: Ambulatory Visit | Attending: Interventional Radiology | Admitting: Interventional Radiology

## 2019-03-05 ENCOUNTER — Other Ambulatory Visit (HOSPITAL_COMMUNITY): Payer: Self-pay | Admitting: Interventional Radiology

## 2019-03-05 ENCOUNTER — Encounter (HOSPITAL_COMMUNITY): Payer: Self-pay

## 2019-03-05 DIAGNOSIS — I671 Cerebral aneurysm, nonruptured: Secondary | ICD-10-CM | POA: Diagnosis not present

## 2019-03-05 DIAGNOSIS — F17210 Nicotine dependence, cigarettes, uncomplicated: Secondary | ICD-10-CM | POA: Insufficient documentation

## 2019-03-05 DIAGNOSIS — G44001 Cluster headache syndrome, unspecified, intractable: Secondary | ICD-10-CM | POA: Insufficient documentation

## 2019-03-05 DIAGNOSIS — R519 Headache, unspecified: Secondary | ICD-10-CM

## 2019-03-05 HISTORY — PX: IR ANGIO VERTEBRAL SEL VERTEBRAL BILAT MOD SED: IMG5369

## 2019-03-05 HISTORY — PX: IR ANGIO INTRA EXTRACRAN SEL COM CAROTID INNOMINATE BILAT MOD SED: IMG5360

## 2019-03-05 LAB — BASIC METABOLIC PANEL
Anion gap: 8 (ref 5–15)
BUN: 6 mg/dL (ref 6–20)
CO2: 26 mmol/L (ref 22–32)
Calcium: 8.4 mg/dL — ABNORMAL LOW (ref 8.9–10.3)
Chloride: 105 mmol/L (ref 98–111)
Creatinine, Ser: 1.05 mg/dL (ref 0.61–1.24)
GFR calc Af Amer: >60 mL/min (ref >60)
GFR calc non Af Amer: >60 mL/min (ref >60)
Glucose, Bld: 78 mg/dL (ref 70–99)
Potassium: 3.9 mmol/L (ref 3.5–5.1)
Sodium: 139 mmol/L (ref 135–145)

## 2019-03-05 LAB — CBC
HCT: 45.9 % (ref 39.0–52.0)
Hemoglobin: 14.8 g/dL (ref 13.0–17.0)
MCH: 27.9 pg (ref 26.0–34.0)
MCHC: 32.2 g/dL (ref 30.0–36.0)
MCV: 86.6 fL (ref 80.0–100.0)
Platelets: 99 10*3/uL — ABNORMAL LOW (ref 150–400)
RBC: 5.3 MIL/uL (ref 4.22–5.81)
RDW: 12.9 % (ref 11.5–15.5)
WBC: 3.6 10*3/uL — ABNORMAL LOW (ref 4.0–10.5)
nRBC: 0 % (ref 0.0–0.2)

## 2019-03-05 LAB — PROTIME-INR
INR: 1.2 (ref 0.8–1.2)
Prothrombin Time: 15.5 seconds — ABNORMAL HIGH (ref 11.4–15.2)

## 2019-03-05 MED ORDER — FENTANYL CITRATE (PF) 100 MCG/2ML IJ SOLN
INTRAMUSCULAR | Status: AC | PRN
  Administered 2019-03-05: 25 ug via INTRAVENOUS

## 2019-03-05 MED ORDER — HEPARIN SODIUM (PORCINE) 1000 UNIT/ML IJ SOLN
INTRAMUSCULAR | Status: AC
  Filled 2019-03-05: qty 1

## 2019-03-05 MED ORDER — VERAPAMIL HCL 2.5 MG/ML IV SOLN
INTRAVENOUS | Status: AC
  Filled 2019-03-05: qty 2

## 2019-03-05 MED ORDER — SODIUM CHLORIDE 0.9 % IV SOLN
INTRAVENOUS | Status: AC | PRN
  Administered 2019-03-05: 250 mL via INTRAVENOUS

## 2019-03-05 MED ORDER — IOHEXOL 300 MG/ML  SOLN: 150.0000 mL | Freq: Once | INTRAMUSCULAR | Status: DC | PRN

## 2019-03-05 MED ORDER — IOHEXOL 300 MG/ML  SOLN: 75.0000 mL | Freq: Once | INTRAMUSCULAR | Status: DC | PRN

## 2019-03-05 MED ORDER — FENTANYL CITRATE (PF) 100 MCG/2ML IJ SOLN
INTRAMUSCULAR | Status: AC
  Filled 2019-03-05: qty 2

## 2019-03-05 MED ORDER — LIDOCAINE HCL 1 % IJ SOLN
INTRAMUSCULAR | Status: AC
  Filled 2019-03-05: qty 20

## 2019-03-05 MED ORDER — LIDOCAINE HCL 1 % IJ SOLN
INTRAMUSCULAR | Status: AC | PRN
  Administered 2019-03-05: 10 mL

## 2019-03-05 MED ORDER — MIDAZOLAM HCL 2 MG/2ML IJ SOLN
INTRAMUSCULAR | Status: AC
  Filled 2019-03-05: qty 2

## 2019-03-05 MED ORDER — SODIUM CHLORIDE 0.9 % IV SOLN
INTRAVENOUS | Status: DC
  Administered 2019-03-05: 12:00:00 via INTRAVENOUS

## 2019-03-05 MED ORDER — MIDAZOLAM HCL 2 MG/2ML IJ SOLN
INTRAMUSCULAR | Status: AC | PRN
  Administered 2019-03-05: 1 mg via INTRAVENOUS

## 2019-03-05 MED ORDER — SODIUM CHLORIDE 0.9 % IV SOLN: INTRAVENOUS | Status: AC

## 2019-03-05 MED ORDER — FENTANYL CITRATE (PF) 100 MCG/2ML IJ SOLN
25.0000 ug | Freq: Once | INTRAMUSCULAR | Status: AC
  Administered 2019-03-05: 25 ug via INTRAVENOUS
  Filled 2019-03-05: qty 2

## 2019-03-05 MED ORDER — NITROGLYCERIN 1 MG/10 ML FOR IR/CATH LAB
INTRA_ARTERIAL | Status: AC
  Filled 2019-03-05: qty 10

## 2019-03-05 NOTE — Progress Notes (Signed)
Patient states he feels much relief.  Still feels tender when palpated but does not hurt otherwise.

## 2019-03-05 NOTE — Progress Notes (Signed)
Right groin checked with Amy, RN. Dressing clean, dry, intact. Level 0.

## 2019-03-05 NOTE — Discharge Instructions (Signed)
Femoral Site Care °This sheet gives you information about how to care for yourself after your procedure. Your health care provider may also give you more specific instructions. If you have problems or questions, contact your health care provider. °What can I expect after the procedure? °After the procedure, it is common to have: °· Bruising that usually fades within 1-2 weeks. °· Tenderness at the site. °Follow these instructions at home: °Wound care °· Follow instructions from your health care provider about how to take care of your insertion site. Make sure you: °? Wash your hands with soap and water before you change your bandage (dressing). If soap and water are not available, use hand sanitizer. °? Change your dressing as told by your health care provider. °? Leave stitches (sutures), skin glue, or adhesive strips in place. These skin closures may need to stay in place for 2 weeks or longer. If adhesive strip edges start to loosen and curl up, you may trim the loose edges. Do not remove adhesive strips completely unless your health care provider tells you to do that. °· Do not take baths, swim, or use a hot tub until your health care provider approves. °· You may shower 24-48 hours after the procedure or as told by your health care provider. °? Gently wash the site with plain soap and water. °? Pat the area dry with a clean towel. °? Do not rub the site. This may cause bleeding. °· Do not apply powder or lotion to the site. Keep the site clean and dry. °· Check your femoral site every day for signs of infection. Check for: °? Redness, swelling, or pain. °? Fluid or blood. °? Warmth. °? Pus or a bad smell. °Activity °· For the first 2-3 days after your procedure, or as long as directed: °? Avoid climbing stairs as much as possible. °? Do not squat. °· Do not lift anything that is heavier than 10 lb (4.5 kg), or the limit that you are told, until your health care provider says that it is safe. °· Rest as  directed. °? Avoid sitting for a long time without moving. Get up to take short walks every 1-2 hours. °· Do not drive for 24 hours if you were given a medicine to help you relax (sedative). °General instructions °· Take over-the-counter and prescription medicines only as told by your health care provider. °· Keep all follow-up visits as told by your health care provider. This is important. °Contact a health care provider if you have: °· A fever or chills. °· You have redness, swelling, or pain around your insertion site. °Get help right away if: °· The catheter insertion area swells very fast. °· You pass out. °· You suddenly start to sweat or your skin gets clammy. °· The catheter insertion area is bleeding, and the bleeding does not stop when you hold steady pressure on the area. °· The area near or just beyond the catheter insertion site becomes pale, cool, tingly, or numb. °These symptoms may represent a serious problem that is an emergency. Do not wait to see if the symptoms will go away. Get medical help right away. Call your local emergency services (911 in the [place] Do not drive yourself to the hospital. °Summary °· After the procedure, it is common to have bruising that usually fades within 1-2 weeks. °· Check your femoral site every day for signs of infection. °· Do not lift anything that is heavier than 10 lb (4.5 kg), or the   limit that you are told, until your health care provider says that it is safe. °This information is not intended to replace advice given to you by your health care provider. Make sure you discuss any questions you have with your health care provider. °Document Released: 02/21/2014 Document Revised: 07/03/2017 Document Reviewed: 07/03/2017 °Elsevier Patient Education © 2020 Elsevier Inc. ° °

## 2019-03-05 NOTE — Progress Notes (Signed)
No bleeding or swelling noted after ambulation 

## 2019-03-05 NOTE — Progress Notes (Signed)
Patient c/o 8/10 pain. "feels like someone is sitting on my right groin and punching it."  Site feels soft and comparable to left groin (non procedure side).  Patient winces when RN palpate right groin area.  Pulses present in both lower extremities 2+ pedal and 2+ tibial.  RN spoke with Dr. Estanislado Pandy.  Verbal order for 25 mcg of fentanyl. Continue to Reassess patient and if needed can have 15 of IV toradol prior to discharge.

## 2019-03-05 NOTE — Telephone Encounter (Signed)
Calling back back to see if we received the fax for the paper work for the patient FMLA

## 2019-03-05 NOTE — H&P (Signed)
Chief Complaint: Patient was seen in consultation today for Cerebral arteriogram   Referring Physician(s): Dr Everlena CooperJaffe  Supervising Physician: Julieanne Cottoneveshwar, Sanjeev  [facility]  History of Present Illness: Jacob Holder is a 28 y.o. male   Has had worsening headaches for weeks Noted as early as 1 yr ago-- but no headache again until approx 2 months  ago Now happening more often and lasting longer Starts on left side of head Pounding headache; +N/V Photophobia Weakens pt  Denies numbness tingling; denies speech or vision changes Pt feels weak during and after headache Must lay down in dark room for hrs Has "passed out" before at least once Using all 4s  MR: 8/21: IMPRESSION: 1. 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 2. No evidence of generalized vasculopathy. 3. Normal appearance of the brain.  CT in 5/20: wnl  Referred to Dr Corliss Skainseveshwar for evaluation and possible treatment of aneurysm Scheduled for cerebral arteriogram today   History reviewed. No pertinent past medical history.  Past Surgical History:  Procedure Laterality Date   RECTAL SURGERY     TOOTH EXTRACTION      Allergies: Patient has no known allergies.  Medications: Prior to Admission medications   Medication Sig Start Date End Date Taking? Authorizing Provider  ibuprofen (ADVIL) 200 MG tablet Take 600 mg by mouth every 6 (six) hours as needed for headache.   Yes [provider]  verapamil (CALAN) 80 MG tablet Take 1 tablet (80 mg total) by mouth 3 (three) times daily. 02/20/19  Yes Drema DallasJaffe, Adam R, DO     Family History  Problem Relation Age of Onset   Healthy Mother    Diabetes Father     Social History   Socioeconomic History   Marital status: Single    Spouse name: Not on file   Number of children: 0   Years of education: Not on file   Highest education level: Some college, no degree  Occupational History    Occupation: unemployed  [employer] Needs   Financial resource strain: Not on file   Food insecurity    Worry: Not on file    Inability: Not on file   Transportation needs    Medical: Not on file    Non-medical: Not on file  Tobacco Use   Smoking status: Current Every Day Smoker    Packs/day: 0.50    Types: Cigarettes   Smokeless tobacco: Never Used  Substance and Sexual Activity   Alcohol use: Yes    Comment: from time to time   Drug use: No   Sexual activity: Yes    Birth control/protection: Condom  Lifestyle   Physical activity    Days per week: Not on file    Minutes per session: Not on file   Stress: Not on file  Relationships   Social connections    Talks on phone: Not on file    Gets together: Not on file    Attends religious service: Not on file    Active member of club or organization: Not on file    Attends meetings of clubs or organizations: Not on file    Relationship status: Not on file  Other Topics Concern   Not on file  Social History Narrative   Patient is left-handed. He lives in a 2 level home. He drinks tea 2-3 glasses a day, and coffee occassionally.     Review of Systems: A 12 point ROS discussed and pertinent positives are  indicated in the HPI above.  All other systems are negative.  Review of Systems  Constitutional: Positive for fatigue. Negative for activity change and fever.  HENT: Negative for facial swelling, sinus pressure, tinnitus and trouble swallowing.   Eyes: Negative for visual disturbance.  Respiratory: Negative for choking and chest tightness.   Cardiovascular: Negative for chest pain.  Gastrointestinal: Negative for abdominal pain.  Musculoskeletal: Negative for back pain, gait problem and neck stiffness.  Neurological: Positive for headaches. Negative for dizziness, tremors, seizures, syncope, facial asymmetry, speech difficulty, weakness, light-headedness and numbness.  Psychiatric/Behavioral: Negative for behavioral  problems and confusion.    Vital Signs: BP 115/71    Pulse 88    Temp 98.3 F (36.8 C) (Skin)    Resp 18    Ht 5\' 11"  (1.803 m)    Wt 163 lb (73.9 kg)    SpO2 100%    BMI 22.73 kg/m   Physical Exam Vitals signs reviewed.  HENT:     Head: Atraumatic.     Mouth/Throat:     Mouth: Mucous membranes are moist.  Eyes:     Extraocular Movements: Extraocular movements intact.  Neck:     Musculoskeletal: Normal range of motion.  Cardiovascular:     Rate and Rhythm: Normal rate and regular rhythm.     Heart sounds: Normal heart sounds.  Pulmonary:     Effort: Pulmonary effort is normal.     Breath sounds: Normal breath sounds.  Abdominal:     Tenderness: There is no abdominal tenderness.  Musculoskeletal: Normal range of motion.  Skin:    General: Skin is warm and dry.  Neurological:     Mental Status: He is alert and oriented to person, place, and time.  Psychiatric:        Mood and Affect: Mood normal.        Behavior: Behavior normal.        Thought Content: Thought content normal.        Judgment: Judgment normal.     Imaging: Mr Angio Head Wo Contrast  Result Date: 02/22/2019 CLINICAL DATA:  Worst headache of life EXAM: MRI HEAD WITHOUT CONTRAST MRA HEAD WITHOUT CONTRAST TECHNIQUE: Multiplanar, multiecho pulse sequences of the brain and surrounding structures were obtained without intravenous contrast. Angiographic images of the head were obtained using MRA technique without contrast. COMPARISON:  Head CT 11/30/2018 FINDINGS: MRI HEAD FINDINGS Brain: No acute infarction, hemorrhage, hydrocephalus, extra-axial collection or mass lesion. Vascular: Bulbous appearance of the distal basilar. Skull and upper cervical spine: Normal marrow signal. Sinuses/Orbits: Negative. MRA HEAD FINDINGS Fall this distal basilar which measures 6 mm anterior to posterior. There is more fullness in the left superior cerebellar region but no discrete saccular aneurysm. No generalized vessel beading. No  branch occlusion or flow limiting stenosis. These results will be called to the ordering clinician or representative by the Radiologist Assistant, and communication documented in the PACS or zVision Dashboard. IMPRESSION: 1. 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 2. No evidence of generalized vasculopathy. 3. Normal appearance of the brain. Electronically Signed   By: Marnee SpringJonathon  Watts M.D.   On: 02/22/2019 16:30   Mr Brain Wo Contrast  Result Date: 02/22/2019 CLINICAL DATA:  Worst headache of life EXAM: MRI HEAD WITHOUT CONTRAST MRA HEAD WITHOUT CONTRAST TECHNIQUE: Multiplanar, multiecho pulse sequences of the brain and surrounding structures were obtained without intravenous contrast. Angiographic images of the head were obtained using MRA technique without contrast. COMPARISON:  Head CT  11/30/2018 FINDINGS: MRI HEAD FINDINGS Brain: No acute infarction, hemorrhage, hydrocephalus, extra-axial collection or mass lesion. Vascular: Bulbous appearance of the distal basilar. Skull and upper cervical spine: Normal marrow signal. Sinuses/Orbits: Negative. MRA HEAD FINDINGS Fall this distal basilar which measures 6 mm anterior to posterior. There is more fullness in the left superior cerebellar region but no discrete saccular aneurysm. No generalized vessel beading. No branch occlusion or flow limiting stenosis. These results will be called to the ordering clinician or representative by the Radiologist Assistant, and communication documented in the PACS or zVision Dashboard. IMPRESSION: 1. 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 2. No evidence of generalized vasculopathy. 3. Normal appearance of the brain. Electronically Signed   By: Monte Fantasia M.D.   On: 02/22/2019 16:30    Labs:  CBC: Recent Labs    11/30/18 1648  WBC 6.7  HGB 15.2  HCT 46.6  PLT 108*    COAGS: No results for input(s): INR, APTT in the last 8760 hours.  BMP: Recent Labs      11/30/18 1648  NA 135  K 3.7  CL 100  CO2 26  GLUCOSE 85  BUN 8  CALCIUM 8.9  CREATININE 0.96  GFRNONAA >60  GFRAA >60    LIVER FUNCTION TESTS: No results for input(s): BILITOT, AST, ALT, ALKPHOS, PROT, ALBUMIN in the last 8760 hours.  TUMOR MARKERS: No results for input(s): AFPTM, CEA, CA199, CHROMGRNA in the last 8760 hours.  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  This interventional procedure involves the use of X-rays and because of the nature of the planned procedure, it is possible that we will have prolonged use of X-ray fluoroscopy.  Potential radiation risks to you include (but are not limited to) the following: - A slightly elevated risk for cancer  several years later in life. This risk is typically less than 0.5% percent. This risk is low in comparison to the normal incidence of human cancer, which is 33% for women and 50% for men according to the [employer]. - Radiation induced injury can include skin redness, resembling a rash, tissue breakdown / ulcers and hair loss (which can be temporary or permanent).   The likelihood of either of these occurring depends on the difficulty of the procedure and whether you are sensitive to radiation due to previous procedures, disease, or genetic conditions.   IF your procedure requires a prolonged use of radiation, you will be notified and given written instructions for further action.  It is your responsibility to monitor the irradiated area for the 2 weeks following the procedure and to notify your physician if you are concerned that you have suffered a radiation induced injury.     All of the patient's questions were answered, patient is agreeable to proceed.  Consent signed and in chart.  Thank you for this interesting consult.  I greatly enjoyed meeting Jacob Holder and look forward to participating in their care.  A copy of this report was sent to the requesting provider on this date.  Electronically Signed: Lavonia Drafts, PA-C 03/05/2019, 11:45 AM   I spent a total of  30 Minutes   in face to face in clinical consultation, greater  than 50% of which was counseling/coordinating care for cerebral arteriogram

## 2019-03-05 NOTE — Procedures (Signed)
S/P 4 vessel cerebral arteriogram . RT CFA approach  Findings. 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 S.Manie Bealer MD

## 2019-03-05 NOTE — Sedation Documentation (Signed)
5 fr exoseal placed into right groin. Bedrest to start at 1545

## 2019-03-06 ENCOUNTER — Other Ambulatory Visit (HOSPITAL_COMMUNITY): Payer: Self-pay | Admitting: Interventional Radiology

## 2019-03-06 ENCOUNTER — Telehealth: Payer: Self-pay | Admitting: Student

## 2019-03-06 DIAGNOSIS — R519 Headache, unspecified: Secondary | ICD-10-CM

## 2019-03-06 DIAGNOSIS — G44001 Cluster headache syndrome, unspecified, intractable: Secondary | ICD-10-CM

## 2019-03-06 NOTE — Progress Notes (Signed)
NIR.  Patient with history of severe headaches and basilar apex aneurysm seen on MR/MRA s/p image-guided diagnostic cerebral arteriogram via right femoral approach 03/05/2019 by Dr. Estanislado Pandy which confirmed a basilar apex aneurysm.  Received call from patient today stating that he was having pain at right groin incision. Attempted to have patient come in for evaluation today, however states that he could not find a ride. Patient has a follow-up appointment with [place] tomorrow 03/07/2019 at 1400.  Called patient alongside Dr. Estanislado Pandy at [phone]. Patient states that he has had pain at his right groin incision since procedure. States that pain was so severe that he couldn't sleep last night (states he woke up all night with pain at incision site). States that there is swelling and bruising around site but denies active bleeding from site. States that he has pain when putting weight on right leg/taking a step with right leg. Dr. Estanislado Pandy explained to patient that this is probable scar tissue/normal inflammation from yesterday's procedure. He recommends that patient take Tylenol or Motrin and use warm compresses/heating pad to site PRN pain. Instructed patient to avoid stooping/bending/lifting or walking up stairs until follow-up visit tomorrow- if patient does these things, recommend that patient hold pressure at groin site during these activities. Instructed patient to head to ED if incision site pain/swelling worsens. Kindly reminded patient to follow-up with [place] in clinic (IR at [facility]) tomorrow 03/07/2019 at 1400. All questions answered and concerns addressed. Patient conveys understanding and agrees with plan.   Bea Graff Louk, PA-C 03/06/2019, 2:23 PM

## 2019-03-06 NOTE — Telephone Encounter (Signed)
~  Jacob Holder  Was this patient FMLA one that was sent over for completion yesterday by carolyn?

## 2019-03-07 ENCOUNTER — Other Ambulatory Visit: Payer: Self-pay

## 2019-03-07 ENCOUNTER — Ambulatory Visit (HOSPITAL_COMMUNITY)
Admission: RE | Admit: 2019-03-07 | Discharge: 2019-03-07 | Disposition: A | Discharge status: Home / Self Care | Payer: Managed Care, Other (non HMO) | Source: Ambulatory Visit | Attending: Interventional Radiology | Admitting: Interventional Radiology

## 2019-03-07 DIAGNOSIS — R519 Headache, unspecified: Secondary | ICD-10-CM

## 2019-03-07 DIAGNOSIS — G44001 Cluster headache syndrome, unspecified, intractable: Secondary | ICD-10-CM

## 2019-03-07 NOTE — Progress Notes (Signed)
Chief Complaint: Patient was seen in follow up today for right groin site check.  Supervising Physician: Julieanne Cottoneveshwar, Sanjeev  Patient Status: [facility]  History of Present Illness: Jacob Holder is a 28 y.o. 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 Corliss Skainseveshwar who presents today for right groin site check. Patient called [facility] yesterday due to pain at his right groin incision site - he was unable to present for evaluation and as such was evaluated over the phone by Dr. Corliss Skainseveshwar and Velva HarmanAlly Louk, PA-C. It was recommended that he take PRN Tylenol or Motrin and use warm compress/heating pad to site PRN for pain. It was also recommended that he present today for a groin check as he could not find transportation yesterday. He reports that his groin feels much better and is only really tender with palpation in one area, the bruising appears to have improved and he is able to easily bear weight/ambulate. He lives on a third story walk up apartment and has had no difficulty navigating the stairs today. He denies any bleeding, erythema, edema or leakage from the incision site today. He reports his headaches have been the same since he was seen on 9/1 and he has not noted any additional symptoms. He is hopeful that Dr. Corliss Skainseveshwar can treat his aneurysm in the future.  No past medical history on file.  Past Surgical History:  Procedure Laterality Date  . RECTAL SURGERY    . TOOTH EXTRACTION      Allergies: Patient has no known allergies.  Medications: Prior to Admission medications   Medication Sig Start Date End Date Taking? Authorizing Provider  ibuprofen (ADVIL) 200 MG tablet Take 600 mg by mouth every 6 (six) hours as needed for headache.    [provider]  verapamil (CALAN) 80 MG tablet Take 1 tablet (80 mg total) by mouth 3  (three) times daily. 02/20/19   Drema DallasJaffe, Adam R, DO     Family History  Problem Relation Age of Onset  . Healthy Mother   . Diabetes Father     Social History   Socioeconomic History  . Marital status: Single    Spouse name: Not on file  . Number of children: 0  . Years of education: Not on file  . Highest education level: Some college, no degree  Occupational History  . Occupation: unemployed  Social Needs  . Financial resource strain: Not on file  . Food insecurity    Worry: Not on file    Inability: Not on file  . Transportation needs    Medical: Not on file    Non-medical: Not on file  Tobacco Use  . Smoking status: Current Every Day Smoker    Packs/day: 0.50    Types: Cigarettes  . Smokeless tobacco: Never Used  Substance and Sexual Activity  . Alcohol use: Yes    Comment: from time to time  . Drug use: No  . Sexual activity: Yes    Birth control/protection: Condom  Lifestyle  . Physical activity    Days per week: Not on file    Minutes per session: Not on file  . Stress: Not on file  Relationships  . Social Musicianconnections    Talks on phone: Not on file    Gets together: Not on file    Attends religious service: Not  on file    Active member of club or organization: Not on file    Attends meetings of clubs or organizations: Not on file    Relationship status: Not on file  Other Topics Concern  . Not on file  Social History Narrative   Patient is left-handed. He lives in a 2 level home. He drinks tea 2-3 glasses a day, and coffee occassionally.     Review of Systems: A 12 point ROS discussed and pertinent positives are indicated in the HPI above.  All other systems are negative.  Review of Systems  Constitutional: Negative for chills and fever.  HENT: Negative for nosebleeds, tinnitus and trouble swallowing.   Eyes: Negative for photophobia, pain and redness.  Respiratory: Negative for cough and shortness of breath.   Cardiovascular: Negative for chest  pain.  Gastrointestinal: Negative for abdominal pain, diarrhea, nausea and vomiting.  Skin: Positive for wound (Right groin site with some bruising).  Neurological: Positive for headaches.    Vital Signs: There were no vitals taken for this visit.  Physical Exam Constitutional:      General: He is not in acute distress.    Comments: Very pleasant, good historian.  HENT:     Head: Normocephalic.  Cardiovascular:     Pulses: Normal pulses.     Comments: Right femoral and right DP palpable Pulmonary:     Effort: Pulmonary effort is normal.  Musculoskeletal:     Comments: Ambulates throughout hallways and transfers from chair to bed without difficulty or reports of pain.   Skin:    General: Skin is warm and dry.     Findings: Bruising (right groin) present.     Comments: (+) right groin incision site clean, dry, dressed appropriately on presentation. Dressing removed and approximately 1-2 cm area of mild ecchymosis (mostly yellow with some light purple areas) located only on the lateral portion of the incision noted. No bleeding, erythema, edema or leakage on dressing noted. On palpation of the incision site at approximately the 1 o'clock position there is an approximately 2-3 mm area of firmness which is painful to deep palpation, the area is mobile and there is no palpable thrill. No other areas of pain to palpation noted.   Neurological:     Mental Status: He is alert. Mental status is at baseline.  Psychiatric:        Mood and Affect: Mood normal.        Behavior: Behavior normal.        Thought Content: Thought content normal.        Judgment: Judgment normal.      Imaging: Mr Angio Head Wo Contrast  Result Date: 02/22/2019 CLINICAL DATA:  Worst headache of life EXAM: MRI HEAD WITHOUT CONTRAST MRA HEAD WITHOUT CONTRAST TECHNIQUE: Multiplanar, multiecho pulse sequences of the brain and surrounding structures were obtained without intravenous contrast. Angiographic images of  the head were obtained using MRA technique without contrast. COMPARISON:  Head CT 11/30/2018 FINDINGS: MRI HEAD FINDINGS Brain: No acute infarction, hemorrhage, hydrocephalus, extra-axial collection or mass lesion. Vascular: Bulbous appearance of the distal basilar. Skull and upper cervical spine: Normal marrow signal. Sinuses/Orbits: Negative. MRA HEAD FINDINGS Fall this distal basilar which measures 6 mm anterior to posterior. There is more fullness in the left superior cerebellar region but no discrete saccular aneurysm. No generalized vessel beading. No branch occlusion or flow limiting stenosis. These results will be called to the ordering clinician or representative by the Radiologist Assistant, and communication  documented in the PACS or zVision Dashboard. IMPRESSION: 1. 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 2. No evidence of generalized vasculopathy. 3. Normal appearance of the brain. Electronically Signed   By: Marnee Spring M.D.   On: 02/22/2019 16:30   Mr Brain Wo Contrast  Result Date: 02/22/2019 CLINICAL DATA:  Worst headache of life EXAM: MRI HEAD WITHOUT CONTRAST MRA HEAD WITHOUT CONTRAST TECHNIQUE: Multiplanar, multiecho pulse sequences of the brain and surrounding structures were obtained without intravenous contrast. Angiographic images of the head were obtained using MRA technique without contrast. COMPARISON:  Head CT 11/30/2018 FINDINGS: MRI HEAD FINDINGS Brain: No acute infarction, hemorrhage, hydrocephalus, extra-axial collection or mass lesion. Vascular: Bulbous appearance of the distal basilar. Skull and upper cervical spine: Normal marrow signal. Sinuses/Orbits: Negative. MRA HEAD FINDINGS Fall this distal basilar which measures 6 mm anterior to posterior. There is more fullness in the left superior cerebellar region but no discrete saccular aneurysm. No generalized vessel beading. No branch occlusion or flow limiting stenosis. These results will be  called to the ordering clinician or representative by the Radiologist Assistant, and communication documented in the PACS or zVision Dashboard. IMPRESSION: 1. 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 2. No evidence of generalized vasculopathy. 3. Normal appearance of the brain. Electronically Signed   By: Marnee Spring M.D.   On: 02/22/2019 16:30    Labs:  CBC: Recent Labs    11/30/18 1648 03/05/19 1110  WBC 6.7 3.6*  HGB 15.2 14.8  HCT 46.6 45.9  PLT 108* 99*    COAGS: Recent Labs    03/05/19 1110  INR 1.2    BMP: Recent Labs    11/30/18 1648 03/05/19 1110  NA 135 139  K 3.7 3.9  CL 100 105  CO2 26 26  GLUCOSE 85 78  BUN 8 6  CALCIUM 8.9 8.4*  CREATININE 0.96 1.05  GFRNONAA >60 >60  GFRAA >60 >60    LIVER FUNCTION TESTS: No results for input(s): BILITOT, AST, ALT, ALKPHOS, PROT, ALBUMIN in the last 8760 hours.  TUMOR MARKERS: No results for input(s): AFPTM, CEA, CA199, CHROMGRNA in the last 8760 hours.  Assessment and Plan:  28 y/o M with history of severe headaches for approximately 1 year with acute worsening over the last 2 months noted to have a bulbous/dysplastic basilar tip measuring 6 mm anterior to posterior on MR dated 02/22/19. He underwent diagnostic cerebral angiogram on 03/05/19 with Dr. 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   Patient reports improvement in right groin pain since recommendations yesterday to try OTC pain relievers + PRN warm compress/heating pad. He also reports that the groin incision site is no longer swollen appearing as it was yesterday. He was able to wash the area in the shower today without issue. There has never been any bleeding from the site according to the patient. He reports only pain to palpation of one area of the groin. His gait was observed today and appears to be normal.   Right groin assessed by this writer today  - there is mild ecchymosis on the lateral portion of the incision site which is to be expected 2 days post procedure, there is no appreciable erythema or edema, no bleeding noted on exam. He is moderately tender to deep palpation at approximately the 1 o'clock position where a 2-3 mm area of firmness is located, this area  is mobile without appreciable thrill.   Low concern for pseudoaneurysm at groin incision site today - will not order US at this time. Patient encouraged to continue PRN OTC pain relievers as well as warm compresses/heating pad PRN. He was encouraged to call our office or present to the ED if he has acute worsening of his pain, bleeding, significant edema, numbness, tingling or difficulty walking. We discussed emergency symptoms related to both his aneurysm and right groin incision site which would require an immediate visit to the ED for evaluation.   Unfortunately Dr. Corliss Skainseveshwar was unable to discuss the diagnostic angiogram results and further treatment options with Jacob Holder today as he was in an emergent procedure. He states that he will call Jacob Holder to discuss further plans.   Patient acknowledges and agrees to all the above.    Plan for follow-up: 1. Continue PRN OTC pain relievers + warm compress/heating pad PRN for right groin incision pain 2. Continue to monitor incision site for any worsening of symptoms and call our office or present to the ED if he has further concerns. 3. Dr. Corliss Skainseveshwar to call patient to discuss diagnostic angiogram results and future treatment options.  All questions answered and concerns addressed. Patient conveys understanding and agrees with plan.  Thank you for this interesting consult.  I greatly enjoyed meeting Jacob Holder.  A copy of this report was sent to the requesting provider on this date.  Electronically Signed: Villa HerbShannon A Carizma Dunsworth, PA-C 03/07/2019, 2:42 PM   I spent a total of  25  Minutes in face to face in clinical consultation, greater than 50% of which was counseling/coordinating Holder for right groin incision follow up.

## 2019-03-08 ENCOUNTER — Other Ambulatory Visit (HOSPITAL_COMMUNITY): Payer: Self-pay | Admitting: Interventional Radiology

## 2019-03-08 DIAGNOSIS — I671 Cerebral aneurysm, nonruptured: Secondary | ICD-10-CM

## 2019-03-11 ENCOUNTER — Encounter (HOSPITAL_COMMUNITY): Payer: Self-pay | Admitting: Interventional Radiology

## 2019-03-12 ENCOUNTER — Other Ambulatory Visit (HOSPITAL_COMMUNITY): Payer: Self-pay | Admitting: Interventional Radiology

## 2019-03-12 ENCOUNTER — Ambulatory Visit (HOSPITAL_COMMUNITY)
Admission: RE | Admit: 2019-03-12 | Discharge: 2019-03-12 | Disposition: A | Discharge status: Home / Self Care | Payer: Managed Care, Other (non HMO) | Source: Ambulatory Visit | Attending: Interventional Radiology | Admitting: Interventional Radiology

## 2019-03-12 ENCOUNTER — Other Ambulatory Visit: Payer: Self-pay

## 2019-03-12 DIAGNOSIS — I671 Cerebral aneurysm, nonruptured: Secondary | ICD-10-CM

## 2019-03-12 NOTE — Progress Notes (Signed)
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  Referring Physician(s): Pieter Partridge  Supervising Physician: Luanne Bras  Patient Status: [facility] - Out-pt  History of Present Illness: Jacob Holder is a 28 y.o. male with a past medical history as below, with pertinent past medical history including headaches and current tobacco use. He is known to [facility] and has been followed by Dr. Estanislado Pandy since 02/2019. He first presented to our department at the request of Dr. Tomi Likens for management of a basilar artery aneurysm. He underwent an image-guided diagnostic cerebral arteriogram 03/05/2019 by Dr. Estanislado Pandy which confirmed patient's basilar apex aneurysm.  Patient presents today for follow-up to discuss findings of his recent diagnostic cerebral arteriogram 03/05/2019. Patient awake and alert sitting in chair. Complains of constant mild headaches. States that he takes Advil with some relief of headaches. Denies weakness, numbness/tingling, dizziness, vision changes, hearing changes, tinnitus, or speech difficulty.   No past medical history on file.  Past Surgical History:  Procedure Laterality Date   IR ANGIO INTRA EXTRACRAN SEL COM CAROTID INNOMINATE BILAT MOD SED  03/05/2019   IR ANGIO VERTEBRAL SEL VERTEBRAL BILAT MOD SED  03/05/2019   RECTAL SURGERY     TOOTH EXTRACTION      Allergies: Patient has no known allergies.  Medications: Prior to Admission medications   Medication Sig Start Date End Date Taking? Authorizing Provider  ibuprofen (ADVIL) 200 MG tablet Take 600 mg by mouth every 6 (six) hours as needed for headache.    [provider]  verapamil (CALAN) 80 MG tablet Take 1 tablet (80 mg total) by mouth 3 (three) times daily. 02/20/19   Pieter Partridge, DO     Family History  Problem Relation Age of Onset   Healthy Mother    Diabetes Father     Social History   Socioeconomic History   Marital status: Single    Spouse  name: Not on file   Number of children: 0   Years of education: Not on file   Highest education level: Some college, no degree  Occupational History   Occupation: unemployed  Scientist, product/process development strain: Not on file   Food insecurity    Worry: Not on file    Inability: Not on file   Transportation needs    Medical: Not on file    Non-medical: Not on file  Tobacco Use   Smoking status: Current Every Day Smoker    Packs/day: 0.50    Types: Cigarettes   Smokeless tobacco: Never Used  Substance and Sexual Activity   Alcohol use: Yes    Comment: from time to time   Drug use: No   Sexual activity: Yes    Birth control/protection: Condom  Lifestyle   Physical activity    Days per week: Not on file    Minutes per session: Not on file   Stress: Not on file  Relationships   Social connections    Talks on phone: Not on file    Gets together: Not on file    Attends religious service: Not on file    Active member of club or organization: Not on file    Attends meetings of clubs or organizations: Not on file    Relationship status: Not on file  Other Topics Concern   Not on file  Social History Narrative   Patient is left-handed. He lives in a 2 level home. He drinks tea 2-3 glasses a day,  and coffee occassionally.     Review of Systems: A 12 point ROS discussed and pertinent positives are indicated in the HPI above.  All other systems are negative.  Review of Systems  Constitutional: Negative for chills and fever.  HENT: Negative for hearing loss and tinnitus.   Eyes: Negative for visual disturbance.  Respiratory: Negative for shortness of breath and wheezing.   Cardiovascular: Negative for chest pain and palpitations.  Neurological: Positive for headaches. Negative for dizziness, speech difficulty, weakness and numbness.  Psychiatric/Behavioral: Negative for behavioral problems and confusion.     Physical Exam Constitutional:      General:  He is not in acute distress.    Appearance: Normal appearance.  Pulmonary:     Effort: Pulmonary effort is normal. No respiratory distress.  Skin:    General: Skin is warm and dry.  Neurological:     Mental Status: He is alert and oriented to person, place, and time.  Psychiatric:        Mood and Affect: Mood normal.        Behavior: Behavior normal.        Thought Content: Thought content normal.        Judgment: Judgment normal.      Imaging: Mr Angio Head Wo Contrast  Result Date: 02/22/2019 CLINICAL DATA:  Worst headache of life EXAM: MRI HEAD WITHOUT CONTRAST MRA HEAD WITHOUT CONTRAST TECHNIQUE: Multiplanar, multiecho pulse sequences of the brain and surrounding structures were obtained without intravenous contrast. Angiographic images of the head were obtained using MRA technique without contrast. COMPARISON:  Head CT 11/30/2018 FINDINGS: MRI HEAD FINDINGS Brain: No acute infarction, hemorrhage, hydrocephalus, extra-axial collection or mass lesion. Vascular: Bulbous appearance of the distal basilar. Skull and upper cervical spine: Normal marrow signal. Sinuses/Orbits: Negative. MRA HEAD FINDINGS Fall this distal basilar which measures 6 mm anterior to posterior. There is more fullness in the left superior cerebellar region but no discrete saccular aneurysm. No generalized vessel beading. No branch occlusion or flow limiting stenosis. These results will be called to the ordering clinician or representative by the Radiologist Assistant, and communication documented in the PACS or zVision Dashboard. IMPRESSION: 1. 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 2. No evidence of generalized vasculopathy. 3. Normal appearance of the brain. Electronically Signed   By: Marnee SpringJonathon  Watts M.D.   On: 02/22/2019 16:30   Mr Brain Wo Contrast  Result Date: 02/22/2019 CLINICAL DATA:  Worst headache of life EXAM: MRI HEAD WITHOUT CONTRAST MRA HEAD WITHOUT CONTRAST TECHNIQUE:  Multiplanar, multiecho pulse sequences of the brain and surrounding structures were obtained without intravenous contrast. Angiographic images of the head were obtained using MRA technique without contrast. COMPARISON:  Head CT 11/30/2018 FINDINGS: MRI HEAD FINDINGS Brain: No acute infarction, hemorrhage, hydrocephalus, extra-axial collection or mass lesion. Vascular: Bulbous appearance of the distal basilar. Skull and upper cervical spine: Normal marrow signal. Sinuses/Orbits: Negative. MRA HEAD FINDINGS Fall this distal basilar which measures 6 mm anterior to posterior. There is more fullness in the left superior cerebellar region but no discrete saccular aneurysm. No generalized vessel beading. No branch occlusion or flow limiting stenosis. These results will be called to the ordering clinician or representative by the Radiologist Assistant, and communication documented in the PACS or zVision Dashboard. IMPRESSION: 1. 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 2. No evidence of generalized vasculopathy. 3. Normal appearance of the brain. Electronically Signed   By: Marnee SpringJonathon  Watts M.D.   On: 02/22/2019 16:30  Ir Angio Intra Extracran Sel Com Carotid Innominate Bilat Mod Sed  Result Date: 03/11/2019 CLINICAL DATA:  Recent history of worsening headaches. Worsening early morning headaches. One episode of syncope. Abnormal MRI MRA of the brain. EXAM: BILATERAL COMMON CAROTID AND INNOMINATE ANGIOGRAPHY; IR ANGIO VERTEBRAL SEL VERTEBRAL BILAT MOD SED COMPARISON:  MRI MRA of the brain of 02/22/2019. MEDICATIONS: Heparin 1000 units IV; no antibiotic was administered within 1 hour of the procedure. ANESTHESIA/SEDATION: Versed 1 mg IV; Fentanyl 25 mcg IV Moderate Sedation Time:  28 minutes The patient was continuously monitored during the procedure by the interventional radiology nurse under my direct supervision. CONTRAST:  Isovue 300 approximately 60 mL. FLUOROSCOPY TIME:  Fluoroscopy Time:  8 minutes 42 seconds (948 mGy). COMPLICATIONS: None immediate. TECHNIQUE: Informed written consent was obtained from the patient after a thorough discussion of the procedural risks, benefits and alternatives. All questions were addressed. Maximal Sterile Barrier Technique was utilized including caps, mask, sterile gowns, sterile gloves, sterile drape, hand hygiene and skin antiseptic. A timeout was performed prior to the initiation of the procedure. The right groin was prepped and draped in the usual sterile fashion. Thereafter using modified Seldinger technique, transfemoral access into the right common femoral artery was obtained without difficulty. Over a 0.035 inch guidewire, a 5 French Pinnacle sheath was inserted. Through this, and also over 0.035 inch guidewire, a 5 [place] JB 1 catheter was advanced to the aortic arch region and selectively positioned in the right common carotid artery, the right vertebral artery, the left common carotid artery and the left vertebral artery. FINDINGS: The right common carotid arteriogram demonstrates the right external carotid artery and its major branches to be widely patent. The right internal carotid artery at the bulb to the cranial skull base demonstrates wide patency. The petrous, the cavernous and the supraclinoid segments are widely patent. A 2 mm infundibulum is seen in the right posterior communicating/anterior choroidal artery region. The right middle cerebral artery and the right anterior cerebral artery opacify into the capillary and venous phases. There is mild eccentric prominence of the mid right M1 segment. Prompt flow via the anterior communicating artery of the left anterior cerebral artery A2 segment and distally is seen. The right vertebral artery origin is widely patent. The vessel is seen to opacify to the cranial skull base. Wide patency is seen of the right vertebrobasilar junction and the right posterior-inferior cerebellar artery. The basilar  artery, the posterior cerebral arteries, the superior cerebellar arteries and the anterior-inferior cerebellar arteries opacify into the capillary and venous phases. A lobulated aneurysm is seen of the distal basilar apex associated with patency of the posterior cerebral arteries which appear to originate from the fundus of this dysplastic aneurysm which measures approximately 11 mm x 6.1 mm x 6.4 mm. Inflow of unopacified blood is seen in the basilar artery from the contralateral vertebral artery. The left common carotid arteriogram demonstrates the left external carotid artery and its major branches to be widely patent. The left internal carotid artery at the bulb to the cranial skull base demonstrates wide patency. The petrous, cavernous and the supraclinoid segments are widely patent. The left middle cerebral artery opacifies into the capillary and venous phases. A hypoplastic left anterior cerebral A1 segment is seen contributing to the left anterior cerebral A2 segment and distally. The origin of the left vertebral artery is widely patent. The vessel is seen to opacify to the cranial skull base. Wide patency is seen of the left vertebrobasilar junction and the  left posterior-inferior cerebellar artery. The distal vertebrobasilar junction joins the contralateral vertebrobasilar junction to form the basilar artery. The opacified portion of the basilar artery, the posterior cerebral arteries, the superior cerebellar arteries and the anterior-inferior cerebellar arteries is noted into the capillary and venous phases. Again demonstrated is a dysplastic prominent aneurysm of the basilar apex measuring approximately 11 mm x 6.1 mm x 6.4 mm. 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 Both posterior cerebral arteries arising from fundus of the lobulated aneurysm. PLAN: Consult to discuss management and treatment options of the lobulated basilar apex  aneurysm in the next few days. Electronically Signed   By: Julieanne CottonSanjeev  Deveshwar M.D.   On: 03/06/2019 12:05   Ir Angio Vertebral Sel Vertebral Bilat Mod Sed  Result Date: 03/11/2019 CLINICAL DATA:  Recent history of worsening headaches. Worsening early morning headaches. One episode of syncope. Abnormal MRI MRA of the brain. EXAM: BILATERAL COMMON CAROTID AND INNOMINATE ANGIOGRAPHY; IR ANGIO VERTEBRAL SEL VERTEBRAL BILAT MOD SED COMPARISON:  MRI MRA of the brain of 02/22/2019. MEDICATIONS: Heparin 1000 units IV; no antibiotic was administered within 1 hour of the procedure. ANESTHESIA/SEDATION: Versed 1 mg IV; Fentanyl 25 mcg IV Moderate Sedation Time:  28 minutes The patient was continuously monitored during the procedure by the interventional radiology nurse under my direct supervision. CONTRAST:  Isovue 300 approximately 60 mL. FLUOROSCOPY TIME:  Fluoroscopy Time: 8 minutes 42 seconds (948 mGy). COMPLICATIONS: None immediate. TECHNIQUE: Informed written consent was obtained from the patient after a thorough discussion of the procedural risks, benefits and alternatives. All questions were addressed. Maximal Sterile Barrier Technique was utilized including caps, mask, sterile gowns, sterile gloves, sterile drape, hand hygiene and skin antiseptic. A timeout was performed prior to the initiation of the procedure. The right groin was prepped and draped in the usual sterile fashion. Thereafter using modified Seldinger technique, transfemoral access into the right common femoral artery was obtained without difficulty. Over a 0.035 inch guidewire, a 5 French Pinnacle sheath was inserted. Through this, and also over 0.035 inch guidewire, a 5 [place] JB 1 catheter was advanced to the aortic arch region and selectively positioned in the right common carotid artery, the right vertebral artery, the left common carotid artery and the left vertebral artery. FINDINGS: The right common carotid arteriogram demonstrates the right  external carotid artery and its major branches to be widely patent. The right internal carotid artery at the bulb to the cranial skull base demonstrates wide patency. The petrous, the cavernous and the supraclinoid segments are widely patent. A 2 mm infundibulum is seen in the right posterior communicating/anterior choroidal artery region. The right middle cerebral artery and the right anterior cerebral artery opacify into the capillary and venous phases. There is mild eccentric prominence of the mid right M1 segment. Prompt flow via the anterior communicating artery of the left anterior cerebral artery A2 segment and distally is seen. The right vertebral artery origin is widely patent. The vessel is seen to opacify to the cranial skull base. Wide patency is seen of the right vertebrobasilar junction and the right posterior-inferior cerebellar artery. The basilar artery, the posterior cerebral arteries, the superior cerebellar arteries and the anterior-inferior cerebellar arteries opacify into the capillary and venous phases. A lobulated aneurysm is seen of the distal basilar apex associated with patency of the posterior cerebral arteries which appear to originate from the fundus of this dysplastic aneurysm which measures approximately 11 mm x  6.1 mm x 6.4 mm. Inflow of unopacified blood is seen in the basilar artery from the contralateral vertebral artery. The left common carotid arteriogram demonstrates the left external carotid artery and its major branches to be widely patent. The left internal carotid artery at the bulb to the cranial skull base demonstrates wide patency. The petrous, cavernous and the supraclinoid segments are widely patent. The left middle cerebral artery opacifies into the capillary and venous phases. A hypoplastic left anterior cerebral A1 segment is seen contributing to the left anterior cerebral A2 segment and distally. The origin of the left vertebral artery is widely patent. The vessel  is seen to opacify to the cranial skull base. Wide patency is seen of the left vertebrobasilar junction and the left posterior-inferior cerebellar artery. The distal vertebrobasilar junction joins the contralateral vertebrobasilar junction to form the basilar artery. The opacified portion of the basilar artery, the posterior cerebral arteries, the superior cerebellar arteries and the anterior-inferior cerebellar arteries is noted into the capillary and venous phases. Again demonstrated is a dysplastic prominent aneurysm of the basilar apex measuring approximately 11 mm x 6.1 mm x 6.4 mm. 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 Both posterior cerebral arteries arising from fundus of the lobulated aneurysm. PLAN: Consult to discuss management and treatment options of the lobulated basilar apex aneurysm in the next few days. Electronically Signed   By: Julieanne Cotton M.D.   On: 03/06/2019 12:05    Labs:  CBC: Recent Labs    11/30/18 1648 03/05/19 1110  WBC 6.7 3.6*  HGB 15.2 14.8  HCT 46.6 45.9  PLT 108* 99*    COAGS: Recent Labs    03/05/19 1110  INR 1.2    BMP: Recent Labs    11/30/18 1648 03/05/19 1110  NA 135 139  K 3.7 3.9  CL 100 105  CO2 26 26  GLUCOSE 85 78  BUN 8 6  CALCIUM 8.9 8.4*  CREATININE 0.96 1.05  GFRNONAA >60 >60  GFRAA >60 >60     Assessment and Plan:  Basilar apex aneurysm. Dr. Corliss Skains was present for consultation. Discussed findings of recent diagnostic cerebral arteriogram. Discussed nature of aneurysms, including risk of rupture. Explained that there are two management options moving forward- either routine imaging scans to monitor for changes or with an endovascular embolization procedure. Discussed procedure, including risks and benefits. Explained that if patient decides to move forward with procedure, than he will need to be started on DAPT (Plavix 75 mg once daily and  Aspirin 325 mg once daily) at least 7 days prior to procedure. Patient expresses desire to move forward with procedure.  Discussed patient's tobacco use. Patient states that he is still smoking cigarettes. Strongly encouraged patient to discontinue smoking at this time.  Plan for follow-up with an image-guided cerebral arteriogram with intent to treat (embolize) basilar apex aneurysm ASAP. Informed patient that our schedulers will call him to set up this procedure. Informed patient that once the procedure is scheduled, our schedulers will also let him know when to begin DAPT (Plavix 75 mg once daily and Aspirin 325 mg once daily 7 days prior to procedure).  [facility] in [place], [place] [phone]) at 1239 and left VM to fill prescription- Plavix 75 mg tablets, take one tablet by mouth once daily, dispense 30 tablets with 3 refills.  All questions answered and concerns addressed. Patient conveys understanding and agrees with plan.  Thank  you for this interesting consult.  I greatly enjoyed meeting Jacob Holder and look forward to participating in their care.  A copy of this report was sent to the requesting provider on this date.  Electronically Signed: Elwin Mocha, PA-C 03/12/2019, 10:39 AM   I spent a total of 40 Minutes in face to face in clinical consultation, greater than 50% of which was counseling/coordinating care for basilar apex aneurysm.

## 2019-03-13 ENCOUNTER — Ambulatory Visit: Payer: Managed Care, Other (non HMO) | Admitting: Neurology

## 2019-03-13 NOTE — Telephone Encounter (Addendum)
Call placed to patient in regards to FMLA forms. They have not been received by this office. In voice mail asked patient to refax them to [place] at [phone]

## 2019-03-14 NOTE — Telephone Encounter (Signed)
Following up on phone call yesterday made to patient to request he send/resend FMLA forms to our office as they have not been received. Left message on patient's voice mail yesterday stating above.  Called patient again today and voice mail is full so unable to leave repeat message.

## 2019-04-06 ENCOUNTER — Other Ambulatory Visit (HOSPITAL_COMMUNITY)
Admission: RE | Admit: 2019-04-06 | Discharge: 2019-04-06 | Disposition: A | Discharge status: Home / Self Care | Payer: Managed Care, Other (non HMO) | Source: Ambulatory Visit | Attending: Interventional Radiology | Admitting: Interventional Radiology

## 2019-04-06 DIAGNOSIS — Z01812 Encounter for preprocedural laboratory examination: Secondary | ICD-10-CM | POA: Insufficient documentation

## 2019-04-06 DIAGNOSIS — Z20828 Contact with and (suspected) exposure to other viral communicable diseases: Secondary | ICD-10-CM | POA: Diagnosis not present

## 2019-04-07 LAB — NOVEL CORONAVIRUS, NAA (HOSP ORDER, SEND-OUT TO REF LAB; TAT 18-24 HRS): SARS-CoV-2, NAA: NOT DETECTED

## 2019-04-09 ENCOUNTER — Encounter (HOSPITAL_COMMUNITY): Payer: Self-pay | Admitting: *Deleted

## 2019-04-09 ENCOUNTER — Other Ambulatory Visit: Payer: Self-pay | Admitting: Student

## 2019-04-09 ENCOUNTER — Other Ambulatory Visit: Payer: Self-pay | Admitting: Radiology

## 2019-04-09 ENCOUNTER — Encounter (HOSPITAL_COMMUNITY): Payer: Self-pay | Admitting: Physician Assistant

## 2019-04-09 ENCOUNTER — Other Ambulatory Visit: Payer: Self-pay

## 2019-04-09 NOTE — Progress Notes (Signed)
PA, Anesthesiology, made aware of order for consult.

## 2019-04-09 NOTE — Progress Notes (Signed)
Pt denies SOB, chest pain, and being under the care of a cardiologist. Pt denies having a stress test, echo and cardiac cath. Pt denies having an EKG and chest x ray. Pt denies recent labs. Pt denies having HTN. Pt made aware to stop taking vitamins, fish oil and herbal medications. Do not take any NSAIDs ie: Ibuprofen, Advil, Naproxen (Aleve), Motrin, BC and Goody Powder. Pt stated " I am not feeling well, it feels like I am coming down with a cold." Nurse made pt aware to contact surgeon to make aware of symptoms; pt agreed. Pt verbalized understanding of all pre-op instructions.

## 2019-04-10 ENCOUNTER — Telehealth: Payer: Self-pay | Admitting: Student

## 2019-04-10 ENCOUNTER — Ambulatory Visit (HOSPITAL_COMMUNITY): Admission: RE | Admit: 2019-04-10 | Payer: Managed Care, Other (non HMO) | Source: Ambulatory Visit

## 2019-04-10 ENCOUNTER — Encounter (HOSPITAL_COMMUNITY): Payer: Self-pay

## 2019-04-10 ENCOUNTER — Telehealth (HOSPITAL_COMMUNITY): Payer: Self-pay | Admitting: Radiology

## 2019-04-10 ENCOUNTER — Ambulatory Visit (HOSPITAL_COMMUNITY)
Admission: RE | Admit: 2019-04-10 | Payer: Managed Care, Other (non HMO) | Source: Home / Self Care | Admitting: Interventional Radiology

## 2019-04-10 HISTORY — DX: Aneurysm of unspecified site: I72.9

## 2019-04-10 HISTORY — DX: Headache, unspecified: R51.9

## 2019-04-10 HISTORY — DX: Presence of spectacles and contact lenses: Z97.3

## 2019-04-10 HISTORY — DX: Anxiety disorder, unspecified: F41.9

## 2019-04-10 HISTORY — DX: Gastro-esophageal reflux disease without esophagitis: K21.9

## 2019-04-10 HISTORY — DX: Depression, unspecified: F32.A

## 2019-04-10 SURGERY — RADIOLOGY WITH ANESTHESIA
Anesthesia: General

## 2019-04-10 NOTE — Anesthesia Preprocedure Evaluation (Deleted)
Anesthesia Evaluation    Reviewed: Allergy & Precautions, Patient's Chart, lab work & pertinent test results  Airway        Dental   Pulmonary neg pulmonary ROS, Current Smoker,           Cardiovascular negative cardio ROS       Neuro/Psych  Headaches, PSYCHIATRIC DISORDERS Anxiety Depression    GI/Hepatic Neg liver ROS, GERD  ,  Endo/Other  negative endocrine ROS  Renal/GU negative Renal ROS  negative genitourinary   Musculoskeletal negative musculoskeletal ROS (+)   Abdominal   Peds  Hematology negative hematology ROS (+)   Anesthesia Other Findings cerebral aneurysm  Reproductive/Obstetrics                             Anesthesia Physical Anesthesia Plan  ASA: II  Anesthesia Plan: General   Post-op Pain Management:    Induction: Intravenous  PONV Risk Score and Plan: 3 and Midazolam, Dexamethasone and Ondansetron  Airway Management Planned: Oral ETT  Additional Equipment: Arterial line  Intra-op Plan:   Post-operative Plan: Extubation in OR  Informed Consent: I have reviewed the patients History and Physical, chart, labs and discussed the procedure including the risks, benefits and alternatives for the proposed anesthesia with the patient or authorized representative who has indicated his/her understanding and acceptance.     Dental advisory given  Plan Discussed with: CRNA  Anesthesia Plan Comments: (Procedure cancelled 2/2 pt rescheduled)       Anesthesia Quick Evaluation

## 2019-04-10 NOTE — Telephone Encounter (Signed)
Called pt to reschedule procedure as he was a NO SHOW this morning. No answer and VM was full. Unable to leave message. Will try again later today. JM

## 2019-04-10 NOTE — Progress Notes (Signed)
Patient was scheduled to arrive at 0630 but had not shown.  Reached out to patient and patient's mother.  Patient mother stated that patient had received a phone call that procedure had been moved to 10/14.  Nurse spoke with Precious Bard and Clarisa Fling in radiology and informed them that patient will not be coming for the procedure today.

## 2019-04-10 NOTE — Telephone Encounter (Signed)
NIR.  Patient was scheduled for an image-guided cerebral arteriogram with possible embolization of basilar apex aneurysm this AM with Dr. Estanislado Pandy.   Patient did not show up for his appointment today. Per chart, patient's mother states she received a phone call that procedure has been moved to 04/17/2019.  Spoke with our schedulers who deny moving procedure date.   Dr. Estanislado Pandy attempted to call patient [phone] at [phone], however he did not answer and VM box was full so no message was left.   Dr. Estanislado Pandy called patient's grandmother, Melvyn Neth [phone]) at [phone] and spoke with her. She states that she was aware that her grandson was scheduled for procedure today, however her daughter (patient's mother) told her that procedure was rescheduled. Harmon Pier offered to reach out to patient to get him to contact us regarding missed appointment/rescheduling procedure. Ashley's (scheduler) number [phone]) given to patient's grandmother for contact.   Bea Graff Darryon Bastin, PA-C 04/10/2019, 9:54 AM

## 2019-04-20 ENCOUNTER — Other Ambulatory Visit (HOSPITAL_COMMUNITY)
Admission: RE | Admit: 2019-04-20 | Discharge: 2019-04-20 | Disposition: A | Discharge status: Home / Self Care | Payer: Managed Care, Other (non HMO) | Source: Ambulatory Visit | Attending: Interventional Radiology | Admitting: Interventional Radiology

## 2019-04-20 NOTE — Progress Notes (Signed)
Contacted patient about missed COVID test appointment

## 2019-04-22 ENCOUNTER — Other Ambulatory Visit (HOSPITAL_COMMUNITY): Admission: RE | Admit: 2019-04-22 | Payer: Managed Care, Other (non HMO) | Source: Ambulatory Visit

## 2019-04-23 ENCOUNTER — Other Ambulatory Visit: Payer: Self-pay

## 2019-04-23 ENCOUNTER — Encounter (HOSPITAL_COMMUNITY): Payer: Self-pay | Admitting: Certified Registered Nurse Anesthetist

## 2019-04-23 ENCOUNTER — Other Ambulatory Visit (HOSPITAL_COMMUNITY)
Admission: RE | Admit: 2019-04-23 | Discharge: 2019-04-23 | Disposition: A | Discharge status: Home / Self Care | Payer: Managed Care, Other (non HMO) | Source: Ambulatory Visit | Attending: Interventional Radiology | Admitting: Interventional Radiology

## 2019-04-23 ENCOUNTER — Telehealth (HOSPITAL_COMMUNITY): Payer: Self-pay | Admitting: Radiology

## 2019-04-23 ENCOUNTER — Telehealth (HOSPITAL_COMMUNITY): Payer: Self-pay

## 2019-04-23 ENCOUNTER — Other Ambulatory Visit: Payer: Self-pay | Admitting: Physician Assistant

## 2019-04-23 DIAGNOSIS — Z01812 Encounter for preprocedural laboratory examination: Secondary | ICD-10-CM | POA: Diagnosis not present

## 2019-04-23 DIAGNOSIS — Z20828 Contact with and (suspected) exposure to other viral communicable diseases: Secondary | ICD-10-CM | POA: Diagnosis not present

## 2019-04-23 LAB — SARS CORONAVIRUS 2 (TAT 6-24 HRS): SARS Coronavirus 2: NEGATIVE

## 2019-04-23 NOTE — Progress Notes (Signed)
Pt denies SOB, chest pain, and being under the care of a cardiologist. Pt denies having a stress test, echo and cardiac cath. Pt denies having an EKG and chest x ray. Pt denies recent labs. Pt denies having HTN. Pt stated that he was taking Verapamil for headaches. Pt stated that he was instructed to take Verapamil starting the week before surgery but he is out of the medication; pt stated that he was going to contact surgeon to make aware. Pt made aware to stop taking vitamins, fish oil and herbal medications. Do not take any NSAIDs ie: Ibuprofen, Advil, Naproxen (Aleve), Motrin, BC and Goody Powder. Pt verbalized understanding of all pre-op instructions.

## 2019-04-23 NOTE — Telephone Encounter (Signed)
Called pt because he did not show up for COVID screen or pre-op testing. Called pt to have him go to [facility] for [facility] testing by 330pm today. No answer, left vm for pt to return call or at least let us know he would like to cancel. AW

## 2019-04-23 NOTE — Telephone Encounter (Signed)
Called pt, left VM to find out why he did not show up for his scheduled COVID test on 10/17 and pre-op visit. He is scheduled for brain aneurysm treatment tomorrow with Dr. Estanislado Pandy. Told him on message that if he did not show up to get test for COVID by 3:30 pm today his surgery would be cancelled. JM

## 2019-04-24 ENCOUNTER — Ambulatory Visit (HOSPITAL_COMMUNITY): Admission: RE | Admit: 2019-04-24 | Payer: Managed Care, Other (non HMO) | Source: Ambulatory Visit

## 2019-04-27 ENCOUNTER — Other Ambulatory Visit (HOSPITAL_COMMUNITY): Payer: Managed Care, Other (non HMO) | Attending: Interventional Radiology

## 2019-04-30 ENCOUNTER — Other Ambulatory Visit: Payer: Self-pay | Admitting: Radiology

## 2019-04-30 NOTE — Progress Notes (Signed)
Have attempted to call pt and no answer for pre-op call. Left a message for him to call, but it also looks like he can't be reached to have his Covid test done today. I called and spoke with Caryl Pina at Dr. Arlean Hopping office to let her know that there is difficulty getting in touch with pt. Which has occurred in the past. Not sure if pt has been on the Plavix and Aspirin as instructed by Robyne Peers, PA on 03/12/19.

## 2019-04-30 NOTE — Anesthesia Preprocedure Evaluation (Deleted)
Anesthesia Evaluation    Reviewed: Allergy & Precautions, Patient's Chart, lab work & pertinent test results  History of Anesthesia Complications Negative for: history of anesthetic complications  Airway        Dental   Pulmonary Current Smoker,           Cardiovascular negative cardio ROS       Neuro/Psych  Headaches, Anxiety Depression Intracranial aneurysm negative psych ROS   GI/Hepatic Neg liver ROS, GERD  ,  Endo/Other  negative endocrine ROS  Renal/GU negative Renal ROS  negative genitourinary   Musculoskeletal negative musculoskeletal ROS (+)   Abdominal   Peds  Hematology negative hematology ROS (+)   Anesthesia Other Findings Day of surgery medications reviewed with patient.  Reproductive/Obstetrics negative OB ROS                             Anesthesia Physical Anesthesia Plan  ASA: II  Anesthesia Plan: General   Post-op Pain Management:    Induction: Intravenous  PONV Risk Score and Plan: 1 and Treatment may vary due to age or medical condition, Ondansetron and Dexamethasone  Airway Management Planned: Oral ETT  Additional Equipment: Arterial line  Intra-op Plan:   Post-operative Plan: Extubation in OR  Informed Consent:   Plan Discussed with:   Anesthesia Plan Comments:         Anesthesia Quick Evaluation

## 2019-04-30 NOTE — Progress Notes (Signed)
Was able to reach pt for pre-op call, but he states he needs to reschedule because he couldn't get his Covid test done because his car died and he didn't have any other way to get there. I did ask if he was taking his Aspirin and Plavix and he states he is and has been.  I notified Anderson Malta in IR of this and she states she will notify Dr. Bronson Curb.

## 2019-05-01 ENCOUNTER — Ambulatory Visit (HOSPITAL_COMMUNITY)
Admission: RE | Admit: 2019-05-01 | Payer: Managed Care, Other (non HMO) | Source: Home / Self Care | Admitting: Interventional Radiology

## 2019-05-01 ENCOUNTER — Ambulatory Visit (HOSPITAL_COMMUNITY): Admission: RE | Admit: 2019-05-01 | Payer: Managed Care, Other (non HMO) | Source: Ambulatory Visit

## 2019-05-01 SURGERY — RADIOLOGY WITH ANESTHESIA
Anesthesia: General

## 2019-05-02 ENCOUNTER — Telehealth: Payer: Self-pay | Admitting: Neurology

## 2019-05-02 NOTE — Telephone Encounter (Signed)
Mary patients Nurse Case Manager called needing to know if patient had surgery yesterday? Can you please call her at [phone] and ask for [facility]. Thank you

## 2019-05-02 NOTE — Telephone Encounter (Signed)
Called spoke with Olin Hauser at [facility] she was given information to Dentist office. We do not have information regarding patient surgery.

## 2019-05-06 ENCOUNTER — Other Ambulatory Visit (HOSPITAL_COMMUNITY): Admission: RE | Admit: 2019-05-06 | Payer: Managed Care, Other (non HMO) | Source: Ambulatory Visit

## 2019-05-07 ENCOUNTER — Telehealth (HOSPITAL_COMMUNITY): Payer: Self-pay | Admitting: Radiology

## 2019-05-07 NOTE — Telephone Encounter (Signed)
Returned call to Karalee Height from the [employer] concerning Mr. Jaquay disability/FMLA request. Stanton Kidney needed surgery dates for Mr. Glaus. Informed her that Mr. Eakle has not yet had his surgery. He has been scheduled on three separate occasions with an attempt to schedule a fourth time. The patient has not shown up for his procedures are scheduled. JM

## 2019-06-04 ENCOUNTER — Other Ambulatory Visit (HOSPITAL_COMMUNITY): Payer: Self-pay | Admitting: Interventional Radiology

## 2019-06-04 DIAGNOSIS — R519 Headache, unspecified: Secondary | ICD-10-CM

## 2019-06-04 DIAGNOSIS — G44001 Cluster headache syndrome, unspecified, intractable: Secondary | ICD-10-CM

## 2019-06-06 ENCOUNTER — Telehealth (HOSPITAL_COMMUNITY): Payer: Self-pay

## 2019-06-06 NOTE — Telephone Encounter (Signed)
-----   Message from Ronney Lion, [place] sent at 06/04/2019 11:50 AM EST ----- Regarding: RE: Reschedule Ash,  I spoke with Dr. Estanislado Pandy about this. He would like to see patient in consult prior to scheduling intervention to re-visit management options. Please schedule and please let me know if you have any questions.  Thanks! Ally ----- Message ----- From: Danielle Dess Sent: 06/04/2019  11:10 AM EST To: Ronney Lion, PA-C Subject: Reschedule                                     Radonna Ricker,  Mr. Huesman called and wants to reschedule. He says he has a car now and he will make sure he comes to his surgery. Do yall still want me to schedule?  Thanks, Lia Foyer

## 2019-06-13 ENCOUNTER — Other Ambulatory Visit (HOSPITAL_COMMUNITY): Payer: Self-pay | Admitting: Radiology

## 2019-06-13 ENCOUNTER — Other Ambulatory Visit: Payer: Self-pay

## 2019-06-13 ENCOUNTER — Ambulatory Visit (HOSPITAL_COMMUNITY)
Admission: RE | Admit: 2019-06-13 | Discharge: 2019-06-13 | Disposition: A | Discharge status: Home / Self Care | Payer: Managed Care, Other (non HMO) | Source: Ambulatory Visit | Attending: Interventional Radiology | Admitting: Interventional Radiology

## 2019-06-13 DIAGNOSIS — I671 Cerebral aneurysm, nonruptured: Secondary | ICD-10-CM | POA: Insufficient documentation

## 2019-06-13 DIAGNOSIS — Z7982 Long term (current) use of aspirin: Secondary | ICD-10-CM | POA: Diagnosis not present

## 2019-06-13 DIAGNOSIS — G44001 Cluster headache syndrome, unspecified, intractable: Secondary | ICD-10-CM

## 2019-06-13 DIAGNOSIS — F329 Major depressive disorder, single episode, unspecified: Secondary | ICD-10-CM | POA: Insufficient documentation

## 2019-06-13 DIAGNOSIS — F17210 Nicotine dependence, cigarettes, uncomplicated: Secondary | ICD-10-CM | POA: Diagnosis not present

## 2019-06-13 DIAGNOSIS — F419 Anxiety disorder, unspecified: Secondary | ICD-10-CM | POA: Insufficient documentation

## 2019-06-13 DIAGNOSIS — R519 Headache, unspecified: Secondary | ICD-10-CM

## 2019-06-13 DIAGNOSIS — K219 Gastro-esophageal reflux disease without esophagitis: Secondary | ICD-10-CM | POA: Insufficient documentation

## 2019-06-13 DIAGNOSIS — Z7902 Long term (current) use of antithrombotics/antiplatelets: Secondary | ICD-10-CM | POA: Diagnosis not present

## 2019-06-13 LAB — PLATELET INHIBITION P2Y12: Platelet Function  P2Y12: 228 [PRU] (ref 182–335)

## 2019-06-13 NOTE — Progress Notes (Signed)
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 We have been trying to schedule him for intervention but there has been multiple issues with either ride availability or medication compliance. He is back today to discuss getting rescheduled for procedure. He is still having headaches, sometimes associated with nausea and vomiting. At their worst, he rates them an 8/10. No visual changes or other neurologic symptoms. States he has been taking ASA and Plavix daily for the past few weeks since his original procedure date.  He still smokes cigarettes, but no other illicit substances. He states he now has reliable ride/transportation and is living with his significant other and has help. He would like to have the procedure rescheduled.  Past Medical History:  Diagnosis Date  . Aneurysm ([place])   . Anxiety   . Depression   . GERD (gastroesophageal reflux disease)   . Headache   . Wears contact lenses     Past Surgical History:  Procedure Laterality Date  . IR ANGIO INTRA EXTRACRAN SEL COM CAROTID INNOMINATE BILAT MOD SED  03/05/2019  . IR ANGIO VERTEBRAL SEL VERTEBRAL BILAT MOD SED  03/05/2019  . RECTAL SURGERY    . TOOTH EXTRACTION      Allergies: Patient has no known allergies.  Medications: Prior to Admission medications   Medication Sig Start Date End Date Taking? Authorizing Provider  ibuprofen (ADVIL) 200 MG tablet Take 600 mg by mouth every 6 (six) hours as needed for headache.    [provider]  verapamil (CALAN) 80 MG tablet Take 1 tablet (80 mg total) by mouth 3 (three) times daily. 02/20/19   Pieter Partridge, DO     Family History  Problem Relation Age of Onset  . Healthy Mother   . Thyroid disease Mother   .  Diabetes Father     Social History   Socioeconomic History  . Marital status: Single    Spouse name: Not on file  . Number of children: 0  . Years of education: Not on file  . Highest education level: Some college, no degree  Occupational History  . Occupation: unemployed  Tobacco Use  . Smoking status: Current Every Day Smoker    Packs/day: 0.50    Types: Cigarettes  . Smokeless tobacco: Never Used  Substance and Sexual Activity  . Alcohol use: Yes    Comment: from time to time  . Drug use: No  . Sexual activity: Yes    Birth control/protection: Condom  Other Topics Concern  . Not on file  Social History Narrative   Patient is left-handed. He lives in a 2 level home. He drinks tea 2-3 glasses a day, and coffee occassionally.   Social Determinants of Health   Financial Resource Strain:   . Difficulty of Paying Living Expenses: Not on file  Food Insecurity:   . Worried About Charity fundraiser in the Last Year: Not on file  . Ran Out of Food in the Last Year: Not on file  Transportation Needs:   . Lack of Transportation (Medical): Not on file  . Lack of Transportation (Non-Medical): Not on file  Physical Activity:   . Days of Exercise per Week: Not on file  . Minutes of Exercise per Session: Not on file  Stress:   .  Feeling of Stress : Not on file  Social Connections:   . Frequency of Communication with Friends and Family: Not on file  . Frequency of Social Gatherings with Friends and Family: Not on file  . Attends Religious Services: Not on file  . Active Member of Clubs or Organizations: Not on file  . Attends Banker Meetings: Not on file  . Marital Status: Not on file     Review of Systems: A 12 point ROS discussed and pertinent positives are indicated in the HPI above.  All other systems are negative.  Review of Systems  Vital Signs: There were no vitals taken for this visit.  Physical Exam Constitutional:      Appearance: Normal  appearance.  Cardiovascular:     Rate and Rhythm: Normal rate and regular rhythm.     Heart sounds: Normal heart sounds.  Pulmonary:     Effort: Pulmonary effort is normal. No respiratory distress.     Breath sounds: Normal breath sounds.  Skin:    General: Skin is warm and dry.  Neurological:     General: No focal deficit present.     Mental Status: He is alert and oriented to person, place, and time.  Psychiatric:        Mood and Affect: Mood normal.        Thought Content: Thought content normal.        Judgment: Judgment normal.     Imaging: No results found.  Labs:  CBC: Recent Labs    11/30/18 1648 03/05/19 1110  WBC 6.7 3.6*  HGB 15.2 14.8  HCT 46.6 45.9  PLT 108* 99*    COAGS: Recent Labs    03/05/19 1110  INR 1.2    BMP: Recent Labs    11/30/18 1648 03/05/19 1110  NA 135 139  K 3.7 3.9  CL 100 105  CO2 26 26  GLUCOSE 85 78  BUN 8 6  CALCIUM 8.9 8.4*  CREATININE 0.96 1.05  GFRNONAA >60 >60  GFRAA >60 >60    Assessment and Plan: Basilar aneurysm Cont ASA and Plavix daily. Will check P2Y12, based on this may need to adjust Plavix dosing. Will then plan to schedule intervention/treatment of basilar aneurysm. Pt understands and states will comply with all instructions.   Thank you for this interesting consult.  I greatly enjoyed meeting Jacob Holder and look forward to participating in their care.  A copy of this report was sent to the requesting provider on this date.  Electronically Signed: Brayton El, PA-C 06/13/2019, 2:09 PM   I spent a total of 20 minutes in face to face in clinical consultation, greater than 50% of which was counseling/coordinating care for basilar aneurysm

## 2019-06-13 NOTE — Progress Notes (Signed)
P2Y12 resulted: 228  Per Dr. Estanislado Pandy, have instructed pt to take Plavix 150 mg total daily for the next 2 days, then resume 75 mg daily.  He will get P2Y12 rechecked Mon 12/14  Ascencion Dike PA-C Interventional Radiology 06/13/2019 2:56 PM

## 2019-06-17 ENCOUNTER — Other Ambulatory Visit (HOSPITAL_COMMUNITY): Payer: Self-pay

## 2019-06-17 ENCOUNTER — Other Ambulatory Visit: Payer: Self-pay | Admitting: Student

## 2019-06-17 DIAGNOSIS — I671 Cerebral aneurysm, nonruptured: Secondary | ICD-10-CM

## 2019-06-17 LAB — PLATELET INHIBITION P2Y12: Platelet Function  P2Y12: 166 [PRU] — ABNORMAL LOW (ref 182–335)

## 2019-06-18 ENCOUNTER — Telehealth: Payer: Self-pay | Admitting: Student

## 2019-06-18 NOTE — Telephone Encounter (Signed)
NIR.  P2Y12 166 PRU 06/17/2019. Discussed with Dr. Estanislado Pandy who recommends patient to take Plavix 150 mg once daily until procedure. Called patient at 1041, no answer. Called patient again at 1116 and spoke with patient. Informed patient of above. Patient asks when his procedure will be scheduled- instructed patient to call our schedulers to schedule his procedure. All questions answered and concerns addressed. Patient conveys understanding and agrees with plan.   Bea Graff Dixie Jafri, PA-C 06/18/2019, 11:20 AM

## 2019-06-19 ENCOUNTER — Telehealth (HOSPITAL_COMMUNITY): Payer: Self-pay | Admitting: Radiology

## 2019-06-19 ENCOUNTER — Telehealth (HOSPITAL_COMMUNITY): Payer: Self-pay

## 2019-06-19 NOTE — Telephone Encounter (Signed)
Pt called to schedule his treatment. Sent Anderson Malta a message to return call to schedule. Ok to schedule per note in Epic. AW

## 2019-06-21 ENCOUNTER — Inpatient Hospital Stay (HOSPITAL_COMMUNITY)
Admission: RE | Admit: 2019-06-21 | Discharge: 2019-06-21 | Disposition: A | Discharge status: Home / Self Care | Payer: Managed Care, Other (non HMO) | Source: Ambulatory Visit

## 2019-06-21 NOTE — Progress Notes (Signed)
LVM as pt is too early for covid testing.

## 2019-06-22 ENCOUNTER — Other Ambulatory Visit (HOSPITAL_COMMUNITY)
Admission: RE | Admit: 2019-06-22 | Discharge: 2019-06-22 | Disposition: A | Discharge status: Home / Self Care | Payer: Managed Care, Other (non HMO) | Source: Ambulatory Visit | Attending: Interventional Radiology | Admitting: Interventional Radiology

## 2019-06-22 DIAGNOSIS — Z01812 Encounter for preprocedural laboratory examination: Secondary | ICD-10-CM | POA: Diagnosis present

## 2019-06-22 DIAGNOSIS — Z20828 Contact with and (suspected) exposure to other viral communicable diseases: Secondary | ICD-10-CM | POA: Insufficient documentation

## 2019-06-23 LAB — NOVEL CORONAVIRUS, NAA (HOSP ORDER, SEND-OUT TO REF LAB; TAT 18-24 HRS): SARS-CoV-2, NAA: NOT DETECTED

## 2019-06-24 ENCOUNTER — Ambulatory Visit: Payer: Managed Care, Other (non HMO) | Admitting: Neurology

## 2019-06-24 ENCOUNTER — Other Ambulatory Visit: Payer: Self-pay

## 2019-06-24 ENCOUNTER — Encounter (HOSPITAL_COMMUNITY): Payer: Self-pay | Admitting: Interventional Radiology

## 2019-06-24 NOTE — Progress Notes (Signed)
Spoke with pt for pre-op call. Pt denies cardiac history, HTN or Diabetes. Pt states he has been taking his 81 mg Aspirin and 150 mg Plavix as instructed.   Covid test done on 06/22/19 and it's negative. Pt states he has been in quarantine since the test was done and understands that he needs to stay in quarantine on he comes to hospital on Wednesday.   Visitation policy reviewed with pt and he voiced understanding.

## 2019-06-25 ENCOUNTER — Telehealth (HOSPITAL_COMMUNITY): Payer: Self-pay

## 2019-06-25 ENCOUNTER — Other Ambulatory Visit: Payer: Self-pay | Admitting: Radiology

## 2019-06-25 NOTE — Telephone Encounter (Signed)
Called to cancel procedure for tomorrow, no answer, vm full. AW

## 2019-06-25 NOTE — Anesthesia Preprocedure Evaluation (Addendum)
Anesthesia Evaluation  Patient identified by MRN, date of birth, ID band Patient awake    Reviewed: Allergy & Precautions, H&P , NPO status , Patient's Chart, lab work & pertinent test results  Airway Mallampati: II  TM Distance: >3 FB Neck ROM: Full    Dental no notable dental hx. (+) Teeth Intact, Dental Advisory Given   Pulmonary Current Smoker and Patient abstained from smoking.,    Pulmonary exam normal breath sounds clear to auscultation       Cardiovascular Exercise Tolerance: Good negative cardio ROS   Rhythm:Regular Rate:Normal     Neuro/Psych  Headaches, Anxiety Depression    GI/Hepatic Neg liver ROS, GERD  ,  Endo/Other  negative endocrine ROS  Renal/GU negative Renal ROS  negative genitourinary   Musculoskeletal   Abdominal   Peds  Hematology negative hematology ROS (+)   Anesthesia Other Findings   Reproductive/Obstetrics negative OB ROS                           Anesthesia Physical Anesthesia Plan  ASA: II  Anesthesia Plan: General   Post-op Pain Management:    Induction: Intravenous  PONV Risk Score and Plan: 2 and Ondansetron, Dexamethasone and Midazolam  Airway Management Planned: Oral ETT  Additional Equipment: Arterial line  Intra-op Plan:   Post-operative Plan: Extubation in OR  Informed Consent: I have reviewed the patients History and Physical, chart, labs and discussed the procedure including the risks, benefits and alternatives for the proposed anesthesia with the patient or authorized representative who has indicated his/her understanding and acceptance.     Dental advisory given  Plan Discussed with: CRNA  Anesthesia Plan Comments:         Anesthesia Quick Evaluation

## 2019-06-25 NOTE — Telephone Encounter (Signed)
Called pt, no answer, vm full. AW  

## 2019-06-26 ENCOUNTER — Encounter (HOSPITAL_COMMUNITY)
Admission: RE | Discharge status: Against Medical Advice | Payer: Self-pay | Source: Home / Self Care | Attending: Interventional Radiology

## 2019-06-26 ENCOUNTER — Encounter (HOSPITAL_COMMUNITY): Payer: Self-pay | Admitting: Interventional Radiology

## 2019-06-26 ENCOUNTER — Other Ambulatory Visit: Payer: Self-pay

## 2019-06-26 ENCOUNTER — Ambulatory Visit (HOSPITAL_COMMUNITY): Payer: Managed Care, Other (non HMO) | Admitting: Certified Registered"

## 2019-06-26 ENCOUNTER — Inpatient Hospital Stay (HOSPITAL_COMMUNITY): Payer: Managed Care, Other (non HMO)

## 2019-06-26 ENCOUNTER — Encounter (HOSPITAL_COMMUNITY): Payer: Self-pay

## 2019-06-26 ENCOUNTER — Inpatient Hospital Stay (HOSPITAL_COMMUNITY)
Admission: RE | Admit: 2019-06-26 | Discharge: 2019-06-30 | DRG: 025 | Discharge status: Against Medical Advice | Payer: Managed Care, Other (non HMO) | Attending: Interventional Radiology | Admitting: Interventional Radiology

## 2019-06-26 ENCOUNTER — Ambulatory Visit (HOSPITAL_COMMUNITY)
Admission: RE | Admit: 2019-06-26 | Discharge: 2019-06-26 | Disposition: A | Discharge status: Home / Self Care | Payer: Managed Care, Other (non HMO) | Source: Ambulatory Visit | Attending: Interventional Radiology | Admitting: Interventional Radiology

## 2019-06-26 DIAGNOSIS — R1310 Dysphagia, unspecified: Secondary | ICD-10-CM | POA: Diagnosis not present

## 2019-06-26 DIAGNOSIS — F17200 Nicotine dependence, unspecified, uncomplicated: Secondary | ICD-10-CM | POA: Diagnosis not present

## 2019-06-26 DIAGNOSIS — Z781 Physical restraint status: Secondary | ICD-10-CM

## 2019-06-26 DIAGNOSIS — Z9289 Personal history of other medical treatment: Secondary | ICD-10-CM | POA: Diagnosis not present

## 2019-06-26 DIAGNOSIS — H47619 Cortical blindness, unspecified side of brain: Secondary | ICD-10-CM | POA: Diagnosis not present

## 2019-06-26 DIAGNOSIS — R569 Unspecified convulsions: Secondary | ICD-10-CM | POA: Diagnosis not present

## 2019-06-26 DIAGNOSIS — Z8349 Family history of other endocrine, nutritional and metabolic diseases: Secondary | ICD-10-CM | POA: Diagnosis not present

## 2019-06-26 DIAGNOSIS — R4701 Aphasia: Secondary | ICD-10-CM | POA: Diagnosis not present

## 2019-06-26 DIAGNOSIS — I63433 Cerebral infarction due to embolism of bilateral posterior cerebral arteries: Secondary | ICD-10-CM | POA: Diagnosis not present

## 2019-06-26 DIAGNOSIS — G4700 Insomnia, unspecified: Secondary | ICD-10-CM | POA: Diagnosis not present

## 2019-06-26 DIAGNOSIS — F1410 Cocaine abuse, uncomplicated: Secondary | ICD-10-CM | POA: Diagnosis present

## 2019-06-26 DIAGNOSIS — H53123 Transient visual loss, bilateral: Secondary | ICD-10-CM | POA: Diagnosis not present

## 2019-06-26 DIAGNOSIS — I63439 Cerebral infarction due to embolism of unspecified posterior cerebral artery: Secondary | ICD-10-CM | POA: Diagnosis not present

## 2019-06-26 DIAGNOSIS — I609 Nontraumatic subarachnoid hemorrhage, unspecified: Secondary | ICD-10-CM | POA: Diagnosis not present

## 2019-06-26 DIAGNOSIS — F17210 Nicotine dependence, cigarettes, uncomplicated: Secondary | ICD-10-CM | POA: Diagnosis present

## 2019-06-26 DIAGNOSIS — F99 Mental disorder, not otherwise specified: Secondary | ICD-10-CM | POA: Diagnosis not present

## 2019-06-26 DIAGNOSIS — D696 Thrombocytopenia, unspecified: Secondary | ICD-10-CM | POA: Diagnosis not present

## 2019-06-26 DIAGNOSIS — R339 Retention of urine, unspecified: Secondary | ICD-10-CM | POA: Diagnosis not present

## 2019-06-26 DIAGNOSIS — I10 Essential (primary) hypertension: Secondary | ICD-10-CM

## 2019-06-26 DIAGNOSIS — R4182 Altered mental status, unspecified: Secondary | ICD-10-CM | POA: Diagnosis not present

## 2019-06-26 DIAGNOSIS — I671 Cerebral aneurysm, nonruptured: Secondary | ICD-10-CM | POA: Diagnosis present

## 2019-06-26 DIAGNOSIS — G9341 Metabolic encephalopathy: Secondary | ICD-10-CM | POA: Diagnosis not present

## 2019-06-26 DIAGNOSIS — Z833 Family history of diabetes mellitus: Secondary | ICD-10-CM

## 2019-06-26 DIAGNOSIS — R451 Restlessness and agitation: Secondary | ICD-10-CM | POA: Diagnosis not present

## 2019-06-26 DIAGNOSIS — J9601 Acute respiratory failure with hypoxia: Secondary | ICD-10-CM

## 2019-06-26 DIAGNOSIS — Z7902 Long term (current) use of antithrombotics/antiplatelets: Secondary | ICD-10-CM | POA: Diagnosis not present

## 2019-06-26 DIAGNOSIS — I340 Nonrheumatic mitral (valve) insufficiency: Secondary | ICD-10-CM | POA: Diagnosis not present

## 2019-06-26 DIAGNOSIS — K219 Gastro-esophageal reflux disease without esophagitis: Secondary | ICD-10-CM | POA: Diagnosis present

## 2019-06-26 DIAGNOSIS — E876 Hypokalemia: Secondary | ICD-10-CM | POA: Diagnosis not present

## 2019-06-26 DIAGNOSIS — F1010 Alcohol abuse, uncomplicated: Secondary | ICD-10-CM | POA: Diagnosis present

## 2019-06-26 DIAGNOSIS — F5105 Insomnia due to other mental disorder: Secondary | ICD-10-CM | POA: Diagnosis not present

## 2019-06-26 DIAGNOSIS — I615 Nontraumatic intracerebral hemorrhage, intraventricular: Secondary | ICD-10-CM | POA: Diagnosis present

## 2019-06-26 DIAGNOSIS — Z7982 Long term (current) use of aspirin: Secondary | ICD-10-CM | POA: Diagnosis not present

## 2019-06-26 DIAGNOSIS — R29705 NIHSS score 5: Secondary | ICD-10-CM | POA: Diagnosis not present

## 2019-06-26 HISTORY — PX: IR NEURO EACH ADD'L AFTER BASIC UNI RIGHT (MS): IMG5374

## 2019-06-26 HISTORY — PX: IR TRANSCATH/EMBOLIZ: IMG695

## 2019-06-26 HISTORY — PX: IR ANGIOGRAM FOLLOW UP STUDY: IMG697

## 2019-06-26 HISTORY — PX: RADIOLOGY WITH ANESTHESIA: SHX6223

## 2019-06-26 HISTORY — PX: IR ANGIO VERTEBRAL SEL VERTEBRAL UNI R MOD SED: IMG5368

## 2019-06-26 LAB — POCT I-STAT 7, (LYTES, BLD GAS, ICA,H+H)
Acid-Base Excess: 5 mmol/L — ABNORMAL HIGH (ref 0.0–2.0)
Bicarbonate: 28.7 mmol/L — ABNORMAL HIGH (ref 20.0–28.0)
Calcium, Ion: 1.11 mmol/L — ABNORMAL LOW (ref 1.15–1.40)
HCT: 41 % (ref 39.0–52.0)
Hemoglobin: 13.9 g/dL (ref 13.0–17.0)
O2 Saturation: 100 %
Patient temperature: 98.2
Potassium: 3.8 mmol/L (ref 3.5–5.1)
Sodium: 140 mmol/L (ref 135–145)
TCO2: 30 mmol/L (ref 22–32)
pCO2 arterial: 37.8 mmHg (ref 32.0–48.0)
pH, Arterial: 7.488 — ABNORMAL HIGH (ref 7.350–7.450)
pO2, Arterial: 641 mmHg — ABNORMAL HIGH (ref 83.0–108.0)

## 2019-06-26 LAB — BASIC METABOLIC PANEL
Anion gap: 8 (ref 5–15)
BUN: 7 mg/dL (ref 6–20)
CO2: 26 mmol/L (ref 22–32)
Calcium: 8.8 mg/dL — ABNORMAL LOW (ref 8.9–10.3)
Chloride: 103 mmol/L (ref 98–111)
Creatinine, Ser: 0.99 mg/dL (ref 0.61–1.24)
GFR calc Af Amer: >60 mL/min (ref >60)
GFR calc non Af Amer: >60 mL/min (ref >60)
Glucose, Bld: 88 mg/dL (ref 70–99)
Potassium: 3.7 mmol/L (ref 3.5–5.1)
Sodium: 137 mmol/L (ref 135–145)

## 2019-06-26 LAB — CBC WITH DIFFERENTIAL/PLATELET
Abs Immature Granulocytes: 0.02 10*3/uL (ref 0.00–0.07)
Basophils Absolute: 0 10*3/uL (ref 0.0–0.1)
Basophils Relative: 1 %
Eosinophils Absolute: 0.3 10*3/uL (ref 0.0–0.5)
Eosinophils Relative: 7 %
HCT: 44.7 % (ref 39.0–52.0)
Hemoglobin: 14.8 g/dL (ref 13.0–17.0)
Immature Granulocytes: 1 %
Lymphocytes Relative: 43 %
Lymphs Abs: 1.7 10*3/uL (ref 0.7–4.0)
MCH: 28.6 pg (ref 26.0–34.0)
MCHC: 33.1 g/dL (ref 30.0–36.0)
MCV: 86.5 fL (ref 80.0–100.0)
Monocytes Absolute: 0.3 10*3/uL (ref 0.1–1.0)
Monocytes Relative: 7 %
Neutro Abs: 1.7 10*3/uL (ref 1.7–7.7)
Neutrophils Relative %: 41 %
Platelets: 96 10*3/uL — ABNORMAL LOW (ref 150–400)
RBC: 5.17 MIL/uL (ref 4.22–5.81)
RDW: 12.2 % (ref 11.5–15.5)
WBC: 4.1 10*3/uL (ref 4.0–10.5)
nRBC: 0 % (ref 0.0–0.2)

## 2019-06-26 LAB — URINALYSIS, COMPLETE (UACMP) WITH MICROSCOPIC
Bacteria, UA: NONE SEEN
Bilirubin Urine: NEGATIVE
Glucose, UA: NEGATIVE mg/dL
Hgb urine dipstick: NEGATIVE
Ketones, ur: NEGATIVE mg/dL
Leukocytes,Ua: NEGATIVE
Nitrite: NEGATIVE
Protein, ur: NEGATIVE mg/dL
Specific Gravity, Urine: 1.019 (ref 1.005–1.030)
pH: 6 (ref 5.0–8.0)

## 2019-06-26 LAB — RAPID URINE DRUG SCREEN, HOSP PERFORMED
Amphetamines: NOT DETECTED
Barbiturates: NOT DETECTED
Benzodiazepines: POSITIVE — AB
Cocaine: POSITIVE — AB
Opiates: NOT DETECTED
Tetrahydrocannabinol: NOT DETECTED

## 2019-06-26 LAB — HEMOGLOBIN A1C
Hgb A1c MFr Bld: 5.5 % (ref 4.8–5.6)
Mean Plasma Glucose: 111.15 mg/dL

## 2019-06-26 LAB — GLUCOSE, CAPILLARY: Glucose-Capillary: 108 mg/dL — ABNORMAL HIGH (ref 70–99)

## 2019-06-26 LAB — AMMONIA: Ammonia: 34 umol/L (ref 9–35)

## 2019-06-26 LAB — PLATELET INHIBITION P2Y12: Platelet Function  P2Y12: 90 [PRU] — ABNORMAL LOW (ref 182–335)

## 2019-06-26 LAB — POCT ACTIVATED CLOTTING TIME
Activated Clotting Time: 175 seconds
Activated Clotting Time: 213 seconds

## 2019-06-26 LAB — MRSA PCR SCREENING: MRSA by PCR: NEGATIVE

## 2019-06-26 LAB — PROTIME-INR
INR: 1.1 (ref 0.8–1.2)
Prothrombin Time: 13.8 seconds (ref 11.4–15.2)

## 2019-06-26 LAB — HEPARIN LEVEL (UNFRACTIONATED): Heparin Unfractionated: <0.1 IU/mL — ABNORMAL LOW (ref 0.30–0.70)

## 2019-06-26 LAB — PROCALCITONIN: Procalcitonin: <0.1 ng/mL

## 2019-06-26 SURGERY — IR WITH ANESTHESIA
Anesthesia: General

## 2019-06-26 MED ORDER — FENTANYL CITRATE (PF) 100 MCG/2ML IJ SOLN: 100.0000 ug | Freq: Once | INTRAMUSCULAR | Status: DC

## 2019-06-26 MED ORDER — CISATRACURIUM BOLUS VIA INFUSION: 0.1000 mg/kg | Freq: Once | INTRAVENOUS | Status: DC

## 2019-06-26 MED ORDER — ACETAMINOPHEN 500 MG PO TABS
1000.0000 mg | ORAL_TABLET | Freq: Once | ORAL | Status: AC
  Administered 2019-06-26: 07:00:00 1000 mg via ORAL
  Filled 2019-06-26: qty 2

## 2019-06-26 MED ORDER — CLOPIDOGREL BISULFATE 75 MG PO TABS
75.0000 mg | ORAL_TABLET | ORAL | Status: AC
  Administered 2019-06-26: 08:00:00 75 mg via ORAL
  Filled 2019-06-26: qty 1

## 2019-06-26 MED ORDER — SUGAMMADEX SODIUM 200 MG/2ML IV SOLN
INTRAVENOUS | Status: DC | PRN
  Administered 2019-06-26: 150 mg via INTRAVENOUS

## 2019-06-26 MED ORDER — CISATRACURIUM BESYLATE (PF) 10 MG/5ML IV SOLN
0.1000 mg/kg | Freq: Once | INTRAVENOUS | Status: AC
  Administered 2019-06-27: 04:00:00 7.4 mg via INTRAVENOUS
  Filled 2019-06-26: qty 3.7

## 2019-06-26 MED ORDER — FENTANYL CITRATE (PF) 100 MCG/2ML IJ SOLN: 50.0000 ug | INTRAMUSCULAR | Status: DC | PRN

## 2019-06-26 MED ORDER — ONDANSETRON HCL 4 MG/2ML IJ SOLN
INTRAMUSCULAR | Status: DC | PRN
  Administered 2019-06-26: 4 mg via INTRAVENOUS

## 2019-06-26 MED ORDER — DEXAMETHASONE SODIUM PHOSPHATE 10 MG/ML IJ SOLN
INTRAMUSCULAR | Status: DC | PRN
  Administered 2019-06-26: 4 mg via INTRAVENOUS

## 2019-06-26 MED ORDER — PROPOFOL 10 MG/ML IV BOLUS
INTRAVENOUS | Status: DC | PRN
  Administered 2019-06-26: 50 mg via INTRAVENOUS
  Administered 2019-06-26: 150 mg via INTRAVENOUS

## 2019-06-26 MED ORDER — CEFAZOLIN SODIUM-DEXTROSE 2-4 GM/100ML-% IV SOLN
INTRAVENOUS | Status: AC
  Filled 2019-06-26: qty 100

## 2019-06-26 MED ORDER — ROCURONIUM BROMIDE 50 MG/5ML IV SOLN
75.0000 mg | Freq: Once | INTRAVENOUS | Status: AC
  Administered 2019-06-26: 17:00:00 75 mg via INTRAVENOUS

## 2019-06-26 MED ORDER — CEFAZOLIN SODIUM-DEXTROSE 2-4 GM/100ML-% IV SOLN
2.0000 g | INTRAVENOUS | Status: AC
  Administered 2019-06-26: 2 g via INTRAVENOUS

## 2019-06-26 MED ORDER — CHLORHEXIDINE GLUCONATE CLOTH 2 % EX PADS
6.0000 | MEDICATED_PAD | Freq: Every day | CUTANEOUS | Status: DC
  Administered 2019-06-26 – 2019-06-29 (×3): 6 via TOPICAL

## 2019-06-26 MED ORDER — NITROGLYCERIN 1 MG/10 ML FOR IR/CATH LAB
INTRA_ARTERIAL | Status: AC
  Filled 2019-06-26: qty 10

## 2019-06-26 MED ORDER — IOHEXOL 300 MG/ML  SOLN
150.0000 mL | Freq: Once | INTRAMUSCULAR | Status: AC | PRN
  Administered 2019-06-26: 75 mL via INTRA_ARTERIAL

## 2019-06-26 MED ORDER — ASPIRIN EC 325 MG PO TBEC
325.0000 mg | DELAYED_RELEASE_TABLET | ORAL | Status: AC
  Administered 2019-06-26: 325 mg via ORAL
  Filled 2019-06-26: qty 1

## 2019-06-26 MED ORDER — CLEVIDIPINE BUTYRATE 0.5 MG/ML IV EMUL
INTRAVENOUS | Status: AC
  Filled 2019-06-26: qty 50

## 2019-06-26 MED ORDER — LACTATED RINGERS IV SOLN: INTRAVENOUS | Status: DC | PRN

## 2019-06-26 MED ORDER — ORAL CARE MOUTH RINSE
15.0000 mL | OROMUCOSAL | Status: DC
  Administered 2019-06-26 – 2019-06-27 (×11): 15 mL via OROMUCOSAL

## 2019-06-26 MED ORDER — FENTANYL 2500MCG IN NS 250ML (10MCG/ML) PREMIX INFUSION
50.0000 ug/h | INTRAVENOUS | Status: DC
  Administered 2019-06-26: 18:00:00 50 ug/h via INTRAVENOUS
  Administered 2019-06-26: 20:00:00 200 ug/h via INTRAVENOUS
  Filled 2019-06-26 (×2): qty 250

## 2019-06-26 MED ORDER — PROPOFOL 1000 MG/100ML IV EMUL
0.0000 ug/kg/min | INTRAVENOUS | Status: DC
  Administered 2019-06-26: 18:00:00 50 ug/kg/min via INTRAVENOUS

## 2019-06-26 MED ORDER — FENTANYL CITRATE (PF) 100 MCG/2ML IJ SOLN
25.0000 ug | INTRAMUSCULAR | Status: DC | PRN
  Administered 2019-06-26: 17:00:00 100 ug via INTRAVENOUS

## 2019-06-26 MED ORDER — PHENYLEPHRINE HCL-NACL 10-0.9 MG/250ML-% IV SOLN
INTRAVENOUS | Status: DC | PRN
  Administered 2019-06-26: 40 ug/min via INTRAVENOUS

## 2019-06-26 MED ORDER — SENNOSIDES 8.8 MG/5ML PO SYRP: 5.0000 mL | ORAL_SOLUTION | Freq: Two times a day (BID) | ORAL | Status: DC | PRN

## 2019-06-26 MED ORDER — ROCURONIUM BROMIDE 10 MG/ML (PF) SYRINGE
PREFILLED_SYRINGE | INTRAVENOUS | Status: DC | PRN
  Administered 2019-06-26: 50 mg via INTRAVENOUS
  Administered 2019-06-26: 10 mg via INTRAVENOUS

## 2019-06-26 MED ORDER — LORAZEPAM 2 MG/ML IJ SOLN
INTRAMUSCULAR | Status: AC
  Filled 2019-06-26: qty 1

## 2019-06-26 MED ORDER — PHENYLEPHRINE 40 MCG/ML (10ML) SYRINGE FOR IV PUSH (FOR BLOOD PRESSURE SUPPORT)
PREFILLED_SYRINGE | INTRAVENOUS | Status: DC | PRN
  Administered 2019-06-26 (×2): 80 ug via INTRAVENOUS

## 2019-06-26 MED ORDER — FENTANYL CITRATE (PF) 100 MCG/2ML IJ SOLN
INTRAMUSCULAR | Status: AC
  Filled 2019-06-26: qty 4

## 2019-06-26 MED ORDER — MIDAZOLAM HCL 2 MG/2ML IJ SOLN
2.0000 mg | INTRAMUSCULAR | Status: DC | PRN
  Administered 2019-06-27: 07:00:00 2 mg via INTRAVENOUS
  Filled 2019-06-26 (×2): qty 2

## 2019-06-26 MED ORDER — SUCCINYLCHOLINE CHLORIDE 20 MG/ML IJ SOLN
INTRAMUSCULAR | Status: DC | PRN
  Administered 2019-06-26: 120 mg via INTRAVENOUS

## 2019-06-26 MED ORDER — HEPARIN SODIUM (PORCINE) 1000 UNIT/ML IJ SOLN
INTRAMUSCULAR | Status: DC | PRN
  Administered 2019-06-26: 1000 [IU] via INTRAVENOUS
  Administered 2019-06-26: 3000 [IU] via INTRAVENOUS
  Administered 2019-06-26: 1000 [IU] via INTRAVENOUS

## 2019-06-26 MED ORDER — MIDAZOLAM HCL 2 MG/2ML IJ SOLN
2.0000 mg | Freq: Once | INTRAMUSCULAR | Status: AC
  Administered 2019-06-26: 17:00:00 2 mg via INTRAVENOUS

## 2019-06-26 MED ORDER — PANTOPRAZOLE SODIUM 40 MG IV SOLR
40.0000 mg | INTRAVENOUS | Status: DC
  Administered 2019-06-27 – 2019-06-28 (×2): 40 mg via INTRAVENOUS
  Filled 2019-06-26 (×2): qty 40

## 2019-06-26 MED ORDER — LIDOCAINE 2% (20 MG/ML) 5 ML SYRINGE
INTRAMUSCULAR | Status: DC | PRN
  Administered 2019-06-26: 100 mg via INTRAVENOUS

## 2019-06-26 MED ORDER — MIDAZOLAM HCL 2 MG/2ML IJ SOLN
2.0000 mg | INTRAMUSCULAR | Status: AC | PRN
  Administered 2019-06-27 (×3): 2 mg via INTRAVENOUS
  Filled 2019-06-26 (×3): qty 2

## 2019-06-26 MED ORDER — FENTANYL CITRATE (PF) 100 MCG/2ML IJ SOLN: 50.0000 ug | Freq: Once | INTRAMUSCULAR | Status: DC

## 2019-06-26 MED ORDER — EPTIFIBATIDE 20 MG/10ML IV SOLN
INTRAVENOUS | Status: AC
  Filled 2019-06-26: qty 10

## 2019-06-26 MED ORDER — MIDAZOLAM HCL 2 MG/2ML IJ SOLN
INTRAMUSCULAR | Status: DC | PRN
  Administered 2019-06-26: 2 mg via INTRAVENOUS

## 2019-06-26 MED ORDER — CLEVIDIPINE BUTYRATE 0.5 MG/ML IV EMUL
0.0000 mg/h | INTRAVENOUS | Status: DC
  Administered 2019-06-26: 20:00:00 7 mg/h via INTRAVENOUS
  Administered 2019-06-26: 23:00:00 8 mg/h via INTRAVENOUS
  Administered 2019-06-26 (×2): 4 mg/h via INTRAVENOUS
  Administered 2019-06-27: 20 mg/h via INTRAVENOUS
  Administered 2019-06-27: 21 mg/h via INTRAVENOUS
  Filled 2019-06-26 (×2): qty 50
  Filled 2019-06-26: qty 100
  Filled 2019-06-26 (×3): qty 50

## 2019-06-26 MED ORDER — IOHEXOL 350 MG/ML SOLN
75.0000 mL | Freq: Once | INTRAVENOUS | Status: AC | PRN
  Administered 2019-06-26: 75 mL via INTRAVENOUS

## 2019-06-26 MED ORDER — PROPOFOL 1000 MG/100ML IV EMUL
0.0000 ug/kg/min | INTRAVENOUS | Status: DC
  Administered 2019-06-26 (×2): 45 ug/kg/min via INTRAVENOUS
  Administered 2019-06-27: 06:00:00 35 ug/kg/min via INTRAVENOUS
  Filled 2019-06-26 (×3): qty 100

## 2019-06-26 MED ORDER — LIDOCAINE HCL 1 % IJ SOLN
INTRAMUSCULAR | Status: AC
  Filled 2019-06-26: qty 20

## 2019-06-26 MED ORDER — FENTANYL CITRATE (PF) 250 MCG/5ML IJ SOLN
INTRAMUSCULAR | Status: DC | PRN
  Administered 2019-06-26 (×2): 100 ug via INTRAVENOUS

## 2019-06-26 MED ORDER — HEPARIN (PORCINE) 25000 UT/250ML-% IV SOLN
INTRAVENOUS | Status: AC
  Filled 2019-06-26: qty 250

## 2019-06-26 MED ORDER — CHLORHEXIDINE GLUCONATE 0.12% ORAL RINSE (MEDLINE KIT)
15.0000 mL | Freq: Two times a day (BID) | OROMUCOSAL | Status: DC
  Administered 2019-06-26 – 2019-06-28 (×4): 15 mL via OROMUCOSAL

## 2019-06-26 MED ORDER — ETOMIDATE 2 MG/ML IV SOLN
20.0000 mg | Freq: Once | INTRAVENOUS | Status: AC
  Administered 2019-06-26: 20 mg via INTRAVENOUS

## 2019-06-26 MED ORDER — MIDAZOLAM HCL 2 MG/2ML IJ SOLN
INTRAMUSCULAR | Status: AC
  Filled 2019-06-26: qty 2

## 2019-06-26 MED ORDER — MIDAZOLAM HCL 2 MG/2ML IJ SOLN
INTRAMUSCULAR | Status: AC
  Administered 2019-06-26: 19:00:00 2 mg
  Filled 2019-06-26: qty 2

## 2019-06-26 MED ORDER — FENTANYL BOLUS VIA INFUSION
50.0000 ug | INTRAVENOUS | Status: DC | PRN
  Filled 2019-06-26: qty 50

## 2019-06-26 MED ORDER — LORAZEPAM 2 MG/ML IJ SOLN
1.0000 mg | Freq: Once | INTRAMUSCULAR | Status: AC
  Administered 2019-06-26 (×2): 1 mg via INTRAVENOUS

## 2019-06-26 MED ORDER — LIDOCAINE HCL 1 % IJ SOLN
INTRAMUSCULAR | Status: AC | PRN
  Administered 2019-06-26: 10 mL

## 2019-06-26 MED ORDER — HEPARIN (PORCINE) 25000 UT/250ML-% IV SOLN
650.0000 [IU]/h | INTRAVENOUS | Status: DC
  Administered 2019-06-26: 500 [IU]/h via INTRAVENOUS
  Filled 2019-06-26: qty 250

## 2019-06-26 MED ORDER — SODIUM CHLORIDE 0.9 % IV SOLN: INTRAVENOUS | Status: DC

## 2019-06-26 MED ORDER — IOHEXOL 300 MG/ML  SOLN
100.0000 mL | Freq: Once | INTRAMUSCULAR | Status: AC | PRN
  Administered 2019-06-26: 15 mL via INTRA_ARTERIAL

## 2019-06-26 MED ORDER — NIMODIPINE 30 MG PO CAPS
0.0000 mg | ORAL_CAPSULE | ORAL | Status: AC
  Administered 2019-06-26: 30 mg via ORAL
  Filled 2019-06-26: qty 1

## 2019-06-26 NOTE — Progress Notes (Signed)
Patient ID: Jacob Holder, male   DOB: 1991-02-17, 28 y.o.   MRN: [id number] INR.  Post procedure. Extubated . Denies any H/As,N/V or visual symptoms. Obeys simple commands appropriately. Moves all 4s spontaneously and to command. Distal pulses palpable DPs and PTs RT groin soft. Hemostasis with 60F Koren Shiver S.Tiyon Sanor MD

## 2019-06-26 NOTE — Anesthesia Procedure Notes (Signed)
Procedure Name: Intubation Date/Time: 06/26/2019 10:05 AM Performed by: Amadeo Garnet, CRNA Pre-anesthesia Checklist: Patient identified, Emergency Drugs available, Suction available, Patient being monitored and Timeout performed Patient Re-evaluated:Patient Re-evaluated prior to induction Oxygen Delivery Method: Circle system utilized Preoxygenation: Pre-oxygenation with 100% oxygen Induction Type: Rapid sequence Laryngoscope Size: Mac and 4 Grade View: Grade I Tube type: Oral Tube size: 7.5 mm Number of attempts: 1 Airway Equipment and Method: Stylet Placement Confirmation: ETT inserted through vocal cords under direct vision,  positive ETCO2 and breath sounds checked- equal and bilateral Secured at: 23 cm Tube secured with: Tape Dental Injury: Teeth and Oropharynx as per pre-operative assessment

## 2019-06-26 NOTE — Progress Notes (Signed)
ANTICOAGULATION CONSULT NOTE - Initial Consult  Pharmacy Consult for heparin Indication: s/p neuro-interventional radiological procedure  Patient Measurements: Height: 5\' 11"  (180.3 cm) Weight: 163 lb (73.9 kg) IBW/kg (Calculated) : 75.3   Vital Signs: Temp: 97.8 F (36.6 C) (12/23 1217) BP: 125/83 (12/23 1421) Pulse Rate: 100 (12/23 1431)  Labs: Recent Labs    06/26/19 0635  HGB 14.8  HCT 44.7  PLT 96*  LABPROT 13.8  INR 1.1  CREATININE 0.99    Medical History: Past Medical History:  Diagnosis Date  . Aneurysm ([place])   . Anxiety   . Depression   . GERD (gastroesophageal reflux disease)   . Headache   . Wears contact lenses      Assessment: 28 yo male admitted with daily headaches. Pt had an angiogram completed in September which revealed an aneurysm in the basilar artery. He was admitted today for angiogram and embolization of aneurysm. Stent in place. Starting low dose heparin. Pt is thrombocytopenic at baseline. He was taking aspirin and plavix prior to admission.    Goal of Therapy:  Heparin level 0.1-0.25 units/ml Monitor platelets by anticoagulation protocol: Yes    Plan:  -Increase heparin to 650 units/hr, turn off heparin at 0800 -Daily HL, CBC -Check level this evening   Harvel Quale 06/26/2019,2:45 PM

## 2019-06-26 NOTE — Progress Notes (Signed)
eLink Physician-Brief Progress Note Patient Name: Jacob Holder DOB: 1991-01-19 MRN: [id number]   Date of Service  06/26/2019  HPI/Events of Note  Urinary retention with 100 ml of urine in bladder on bladder scanning, RN asking if Heparin infusion can be paused for trip to MRI, Pt had aneurysm embolization and stent placement for a cerebral aneurysm earlier today.  eICU Interventions  Order for Foley entered, will defer decision regarding pausing Heparin infusion to Neuro-hospitalist Dr. Leonel Ramsay who is following the patient.        Frederik Pear 06/26/2019, 9:23 PM

## 2019-06-26 NOTE — Consult Note (Addendum)
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 male with history of headache, depression, anxiety, aneurysm.  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  Postprocedure patient went to PACU.  He was stating that he could not see.  At that time neurology was consulted to evaluate patient.  On arrival to PACU for evaluation patient was noted to be extremely agitated and confused.  Patient continued to say "wire to help me", "cannot I get up ",.  It was tried multiple times to explain to him we were trying to help him and to see if he would follow commands however patient was having difficulty understanding and perseverated on the above comments.    LKW: 06/26/2019 at 8:30 AM tpa given?: no, recent procedure Premorbid modified Rankin scale (mRS): 0 NIH stroke score 5    Past Medical History:  Diagnosis Date  . Aneurysm (HCC)   . Anxiety   . Depression   . GERD (gastroesophageal reflux disease)   . Headache   . Wears contact lenses     Family History  Problem Relation Age of Onset  . Healthy Mother   . Thyroid disease Mother   . Diabetes Father    Social History:   reports that he has been smoking cigarettes. He has been smoking about 0.50 packs per day. He has never used smokeless tobacco. He reports current alcohol use. He reports that he does not use drugs.  Medications  Current Facility-Administered Medications:  .  0.9 %  sodium chloride infusion, , Intravenous, Continuous, Ralene Muskrat, PA-C .  clevidipine (CLEVIPREX) infusion 0.5 mg/mL, 0-21 mg/hr, Intravenous, Continuous, Deveshwar, Sanjeev, MD .  fentaNYL (SUBLIMAZE) injection 25-50 mcg, 25-50 mcg, Intravenous, Q5  min PRN, Gaynelle Adu, MD .  heparin 25000-0.45 UT/250ML-% infusion, , , ,  .  heparin ADULT infusion 100 units/mL (25000 units/228mL sodium chloride 0.45%), 650 Units/hr, Intravenous, Continuous, Last Rate: 5 mL/hr at 06/26/19 1224, 500 Units/hr at 06/26/19 1224 **AND** heparin per pharmacy consult, , , Until Discontinued, Deveshwar, Sanjeev, MD .  LORazepam (ATIVAN) 2 MG/ML injection, , , ,  .  LORazepam (ATIVAN) injection 1-2 mg, 1-2 mg, Intravenous, Once, Deveshwar, Sanjeev, MD .  nitroGLYCERIN 100 mcg/mL intra-arterial injection, , , ,   Facility-Administered Medications Ordered in Other Encounters:  .  clevidipine (CLEVIPREX) 0.5 MG/ML infusion, , , ,  .  lidocaine (XYLOCAINE) 1 % (with pres) injection, , , ,  .  lidocaine (XYLOCAINE) 1 % (with pres) injection, , , PRN, Julieanne Cotton, MD, 10 mL at 06/26/19 0843   Exam: Current vital signs: BP 119/85   Pulse 91   Temp 97.8 F (36.6 C)   Resp 17   Ht 5\' 11"  (1.803 m)   Wt 73.9 kg   SpO2 100%   BMI 22.73 kg/m  Vital signs in last 24 hours: Temp:  [97.8 F (36.6 C)-98.6 F (37 C)] 97.8 F (36.6 C) (12/23 1217) Pulse Rate:  [86-109] 91 (12/23 1504) Resp:  [12-26] 17 (12/23 1504) BP: (114-126)/(71-106) 119/85 (12/23 1504) SpO2:  [98 %-100 %] 100 % (12/23 1504) Arterial Line BP: (125-149)/(74-86) 134/86 (12/23 1504) Weight:  [73.9 kg] 73.9 kg (12/23 0725)  ROS: Unable to obtain secondary to patient's difficulty  expressing himself and understanding commands  Physical Exam   Constitutional: Appears well-developed and well-nourished.  Psych: Agitated Eyes: No scleral injection HENT: No OP obstrucion Head: Normocephalic.  Cardiovascular: Normal rate and regular rhythm.  Respiratory: Effort normal, non-labored breathing GI: Soft.  No distension. There is no tenderness.  Skin: WDI  Neuro: Mental Status: Patient is awake, confused perseverating on "why can you help me" Is not following any commands secondary  to the fact that he is very upset and scared Positive signs of aphasia Cranial Nerves: II: No blink to threat III,IV, VI: EOMI without ptosis or diploplia. Pupils equal, round and reactive to light, both are 4 mm and constrict to 2 mm V: Facial sensation is symmetric to temperature VII: Facial movement is symmetric.  VIII: hearing is intact to voice. XII: tongue is midline without atrophy or fasciculations.  Motor: Tone is normal. Bulk is normal. 5/5 strength was present in all four extremities.  Sensory: Sensation is symmetric to light touch and temperature in the arms and legs. Deep Tendon Reflexes: 2+ and symmetric in the biceps and patellae.  Plantars: Toes are downgoing bilaterally.  Cerebellar: Unable to perform however with arm movements I did not notice any dysmetria   Labs I have reviewed labs in epic and the results pertinent to this consultation are:   CBC    Component Value Date/Time   WBC 4.1 06/26/2019 0635   RBC 5.17 06/26/2019 0635   HGB 14.8 06/26/2019 0635   HCT 44.7 06/26/2019 0635   PLT 96 (L) 06/26/2019 0635   MCV 86.5 06/26/2019 0635   MCH 28.6 06/26/2019 0635   MCHC 33.1 06/26/2019 0635   RDW 12.2 06/26/2019 0635   LYMPHSABS 1.7 06/26/2019 0635   MONOABS 0.3 06/26/2019 0635   EOSABS 0.3 06/26/2019 0635   BASOSABS 0.0 06/26/2019 0635    CMP     Component Value Date/Time   NA 137 06/26/2019 0635   K 3.7 06/26/2019 0635   CL 103 06/26/2019 0635   CO2 26 06/26/2019 0635   GLUCOSE 88 06/26/2019 0635   BUN 7 06/26/2019 0635   CREATININE 0.99 06/26/2019 0635   CALCIUM 8.8 (L) 06/26/2019 0635   PROT 9.3 (H) 11/30/2016 1358   ALBUMIN 4.0 11/30/2016 1358   AST 20 11/30/2016 1358   ALT 17 11/30/2016 1358   ALKPHOS 56 11/30/2016 1358   BILITOT 0.6 11/30/2016 1358   GFRNONAA >60 06/26/2019 0635   GFRAA >60 06/26/2019 6433    Lipid Panel  No results found for: CHOL, TRIG, HDL, CHOLHDL, VLDL, LDLCALC, LDLDIRECT   Imaging I have reviewed the  images obtained:  CT angio neck and head with and without contrast-arterial stent from the mid basilar artery to the left P2 segment, decreased caliber of the left P2 segment just beyond the stent is likely exaggerated by artifact.  No distal branch vessel occlusion to explain visual loss.  No occlusive disease involving the right PCA branch vessel.  No acute infarct evident.  Normal CTA of the neck.   Etta Quill PA-C [facility] [phone]  M-F  (9:00 am- 5:00 PM)  06/26/2019, 3:17 PM   I have seen the patient and reviewed the above note.  Assessment:  28 year old male presenting to the hospital for embolization of large wide necked basilar apex aneurysm.  Post surgical event patient was noted to complain of bilateral visual loss and aphasia.  Though the circulation intervened upon would not typically be found to cause aphasia, if the thalamus is  involved then this could cause speech abnormalities.  Seizure would be another consideration and a stat EEG should be obtained.  Also, with manipulation of the circulation, there may be some slight increased risk of posterior reversible encephalopathy syndrome, though this is less likely.  Recommendations: -Stat EEG to evaluate for seizure -MRI brain to evaluate for possible stroke Neurology will continue to follow  Ritta SlotMcNeill Crescencio Jozwiak, MD [facility] [phone]  If 7pm- 7am, please page neurology on call as listed in AMION.

## 2019-06-26 NOTE — Progress Notes (Signed)
eLink Physician-Brief Progress Note Patient Name: [name] DOB: December 31, 1990 MRN: [id number]   Date of Service  06/26/2019  HPI/Events of Note  Pt extremely combative on the ventilator when minimally stimulated despite high dose Propofol infusion, this is precluding needed brain MRI  eICU Interventions  Versed 3 mg iv x 1 followed by Nimbex 0.1 mg / kg on call to MRI.        Kerry Kass Ahmed Inniss 06/26/2019, 10:33 PM

## 2019-06-26 NOTE — Procedures (Addendum)
Patient Name: Jacob Holder  MRN: [id number]  Epilepsy Attending: Lora Havens  Referring Physician/Provider: Etta Quill, PA Date: 06/26/2019 Duration: 22.25 mins  Patient history: 28 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  EEG to evaluate for seizures.  Level of alertness: Awake  AEDs during EEG study: None  Technical aspects: This EEG study was done with scalp electrodes positioned according to the 10-20 International system of electrode placement. Electrical activity was acquired at a sampling rate of 500Hz  and reviewed with a high frequency filter of 70Hz  and a low frequency filter of 1Hz . EEG data were recorded continuously and digitally stored.   Description: EEG was technically difficult due to significant myogenic artifact. No clear posterior dominant rhythm was seen. EEG showed continuous generalized 3-5 hz theta-delta slowing. Hyperventilation and photic stimulation were not performed.  Abnormality - Continuous slow, generalized  IMPRESSION: This technically difficult study is suggestive of moderate diffuse encephalopathy, non specific to etiology. No definite seizures or epileptiform discharges were seen throughout the recording.   Amarii Bordas Barbra Sarks

## 2019-06-26 NOTE — Progress Notes (Signed)
Pt. Had over 1L of urine in bladder - foley order received and placed.   Heparin levels updated to prescribed amount, upon shift was at 500 now at 650. Pharmacy aware  Pt too combative and attempting to self extubate with any stimulation. Extremely strong and requiring 3+ nurses to control patient. Maxed out on sedation.  I do not believe MRI is on option at this time. Will continue to attempt and use additional PRN medications. Will update physician.

## 2019-06-26 NOTE — H&P (Signed)
Chief Complaint: Patient was seen in consultation today for cerebral arteriogram with possible basilar artery aneurysm embolization at the request of Dr Milana KidneyA Jaffe   Supervising Physician: Julieanne Cottoneveshwar, Sanjeev  [facility]  History of Present Illness: Jacob Holder is a 28 y.o. male   Headaches daily Denies N/V Denies tingling or dizziness Denies speech or vision changes  Pt was seen in Aug and Sept 2020 for same Imaging revealed Basilar artery aneurysm Angiogram 03/05/19: IMPRESSION: Approximately 11 mm x 6.1 mm x 6.4 mm dysplastic lobulated aneurysm at the basilar artery apex. Both posterior cerebral arteries arising from fundus of the lobulated aneurysm.  Has been scheduled for embolization in past weeks-- but P2y12 always out of range Rescheduled to today P2y12  90 today Taking Plavix 150 mg daily since 06/18/19   Past Medical History:  Diagnosis Date  . Aneurysm (HCC)   . Anxiety   . Depression   . GERD (gastroesophageal reflux disease)   . Headache   . Wears contact lenses     Past Surgical History:  Procedure Laterality Date  . IR ANGIO INTRA EXTRACRAN SEL COM CAROTID INNOMINATE BILAT MOD SED  03/05/2019  . IR ANGIO VERTEBRAL SEL VERTEBRAL BILAT MOD SED  03/05/2019  . RECTAL SURGERY    . TOOTH EXTRACTION      Allergies: Patient has no known allergies.  Medications: Prior to Admission medications   Medication Sig Start Date End Date Taking? Authorizing Provider  aspirin 81 MG chewable tablet Chew 81 mg by mouth daily.    [provider]  clopidogrel (PLAVIX) 75 MG tablet Take 150 mg by mouth daily.    [provider]  ibuprofen (ADVIL) 200 MG tablet Take 600 mg by mouth every 6 (six) hours as needed for headache.    [provider]  verapamil (CALAN) 80 MG tablet Take 1 tablet (80 mg total) by mouth 3 (three) times daily. 02/20/19   Drema DallasJaffe, Adam R, DO     Family History  Problem Relation Age of Onset  . Healthy  Mother   . Thyroid disease Mother   . Diabetes Father     Social History   Socioeconomic History  . Marital status: Single    Spouse name: Not on file  . Number of children: 0  . Years of education: Not on file  . Highest education level: Some college, no degree  Occupational History  . Occupation: unemployed  Tobacco Use  . Smoking status: Current Every Day Smoker    Packs/day: 0.50    Types: Cigarettes  . Smokeless tobacco: Never Used  Substance and Sexual Activity  . Alcohol use: Yes    Comment: from time to time  . Drug use: No  . Sexual activity: Yes    Birth control/protection: Condom  Other Topics Concern  . Not on file  Social History Narrative   Patient is left-handed. He lives in a 2 level home. He drinks tea 2-3 glasses a day, and coffee occassionally.   Social Determinants of Health   Financial Resource Strain:   . Difficulty of Paying Living Expenses: Not on file  Food Insecurity:   . Worried About Programme researcher, broadcasting/film/videounning Out of Food in the Last Year: Not on file  . Ran Out of Food in the Last Year: Not on file  Transportation Needs:   . Lack of Transportation (Medical): Not on file  . Lack of Transportation (Non-Medical): Not on file  Physical Activity:   . Days of  Exercise per Week: Not on file  . Minutes of Exercise per Session: Not on file  Stress:   . Feeling of Stress : Not on file  Social Connections:   . Frequency of Communication with Friends and Family: Not on file  . Frequency of Social Gatherings with Friends and Family: Not on file  . Attends Religious Services: Not on file  . Active Member of Clubs or Organizations: Not on file  . Attends Archivist Meetings: Not on file  . Marital Status: Not on file     Review of Systems: A 12 point ROS discussed and pertinent positives are indicated in the HPI above.  All other systems are negative.  Review of Systems  Constitutional: Negative for activity change, fatigue and fever.  HENT: Negative  for hearing loss, sore throat, tinnitus and trouble swallowing.   Eyes: Negative for visual disturbance.  Respiratory: Negative for cough and shortness of breath.   Cardiovascular: Negative for chest pain.  Gastrointestinal: Negative for abdominal pain and nausea.  Musculoskeletal: Negative for back pain and gait problem.  Neurological: Positive for headaches. Negative for dizziness, tremors, seizures, syncope, facial asymmetry, speech difficulty, weakness, light-headedness and numbness.  Psychiatric/Behavioral: Negative for behavioral problems and confusion.    Vital Signs: BP 120/71   Pulse 86   Temp 98.6 F (37 C)   Resp 18   Ht 5\' 11"  (1.803 m)   Wt 163 lb (73.9 kg)   SpO2 100%   BMI 22.73 kg/m   Physical Exam Vitals reviewed.  HENT:     Head: Atraumatic.     Mouth/Throat:     Mouth: Mucous membranes are moist.  Eyes:     Extraocular Movements: Extraocular movements intact.  Cardiovascular:     Rate and Rhythm: Normal rate and regular rhythm.     Heart sounds: Normal heart sounds.  Pulmonary:     Effort: Pulmonary effort is normal.     Breath sounds: Normal breath sounds.  Abdominal:     Palpations: Abdomen is soft.     Tenderness: There is no abdominal tenderness.  Musculoskeletal:        General: No swelling. Normal range of motion.     Cervical back: Normal range of motion.     Right lower leg: No edema.     Left lower leg: No edema.  Skin:    General: Skin is warm and dry.  Neurological:     Mental Status: He is alert and oriented to person, place, and time.  Psychiatric:        Mood and Affect: Mood normal.        Behavior: Behavior normal.        Thought Content: Thought content normal.        Judgment: Judgment normal.     Imaging: No results found.  Labs:  CBC: Recent Labs    11/30/18 1648 03/05/19 1110 06/26/19 0635  WBC 6.7 3.6* 4.1  HGB 15.2 14.8 14.8  HCT 46.6 45.9 44.7  PLT 108* 99* 96*    COAGS: Recent Labs    03/05/19 1110  06/26/19 0635  INR 1.2 1.1    BMP: Recent Labs    11/30/18 1648 03/05/19 1110 06/26/19 0635  NA 135 139 137  K 3.7 3.9 3.7  CL 100 105 103  CO2 26 26 26   GLUCOSE 85 78 88  BUN 8 6 7   CALCIUM 8.9 8.4* 8.8*  CREATININE 0.96 1.05 0.99  GFRNONAA >60 >60 >60  GFRAA >60 >60 >60    LIVER FUNCTION TESTS: No results for input(s): BILITOT, AST, ALT, ALKPHOS, PROT, ALBUMIN in the last 8760 hours.  TUMOR MARKERS: No results for input(s): AFPTM, CEA, CA199, CHROMGRNA in the last 8760 hours.  Assessment and Plan:  Basilar apex artery aneurysm Embolization scheduled today P2y12  90 today Risks and benefits of cerebral angiogram with intervention were discussed with the patient including, but not limited to bleeding, infection, vascular injury, contrast induced renal failure, stroke or even death.  This interventional procedure involves the use of X-rays and because of the nature of the planned procedure, it is possible that we will have prolonged use of X-ray fluoroscopy.  Potential radiation risks to you include (but are not limited to) the following: - A slightly elevated risk for cancer  several years later in life. This risk is typically less than 0.5% percent. This risk is low in comparison to the normal incidence of human cancer, which is 33% for women and 50% for men according to the American Cancer Society. - Radiation induced injury can include skin redness, resembling a rash, tissue breakdown / ulcers and hair loss (which can be temporary or permanent).   The likelihood of either of these occurring depends on the difficulty of the procedure and whether you are sensitive to radiation due to previous procedures, disease, or genetic conditions.   IF your procedure requires a prolonged use of radiation, you will be notified and given written instructions for further action.  It is your responsibility to monitor the irradiated area for the 2 weeks following the procedure and to  notify your physician if you are concerned that you have suffered a radiation induced injury.    All of the patient's questions were answered, patient is agreeable to proceed. Consent signed and in chart.  Pt is aware if intervention is performed today-- he will be admitted overnight into Neuro ICU. Plan for DC in am He is agreeable  Thank you for this interesting consult.  I greatly enjoyed meeting Jorell Cass and look forward to participating in their care.  A copy of this report was sent to the requesting provider on this date.  Electronically Signed: Robet Leu, PA-C 06/26/2019, 7:20 AM   I spent a total of    25 Minutes in face to face in clinical consultation, greater than 50% of which was counseling/coordinating care for basilar artery aneurysm embolization

## 2019-06-26 NOTE — Progress Notes (Signed)
EEG complete - results pending 

## 2019-06-26 NOTE — Progress Notes (Signed)
Pt came down to MRI with nursing who were giving meds to the patient.  Pt was confused, combative, would not follow commands.  Two doses of Ativan were given, pt still did not slow down, then started screaming, trying to get off the table, etc.  Dr Estanislado Pandy actually came to the department to observe the patient's behavior and realized that even the fastest scanning we have would not result in anything diagnostic.  He was going to try to call and arrange for the patient to have a stronger agent with anesthesia team in order to get the scan accomplished.

## 2019-06-26 NOTE — Procedures (Signed)
Intubation Procedure Note Philmore Lepore [id number] Sep 03, 1990  Procedure: Intubation Indications: Airway protection and maintenance  Procedure Details Consent: Unable to obtain consent because of emergent medical necessity. Time Out: Verified patient identification, verified procedure, site/side was marked, verified correct patient position, special equipment/implants available, medications/allergies/relevent history reviewed, required imaging and test results available.  Performed  Maximum sterile technique was used including antiseptics, gloves, hand hygiene and mask.  MAC    Evaluation Hemodynamic Status: BP stable throughout; O2 sats: stable throughout Patient's Current Condition: stable Complications: No apparent complications Patient did tolerate procedure well. Chest X-ray ordered to verify placement.  CXR: pending.   Jennet Maduro 06/26/2019

## 2019-06-26 NOTE — Progress Notes (Signed)
PT went for MRI. 2 mg Ativan total given for MRI sedation.  PT grew increasingly agitated after ativan.  Security called to help hold pt down.  Pt hollaring and trying to get up.  CCM called per Dr. Marjory Lies for continuing care.  Anesthesia at bedside and Precedex given by Anesthesia.  PT continuing to be uncontrollable.  Dr. Nelda Marseille in PACU  to see pt and pt intubated by CCM.  7.5 tube 22 at lip.

## 2019-06-26 NOTE — Progress Notes (Signed)
Patient ID: Jacob Holder, male   DOB: [birth year], 28 y.o.   MRN: [id number] INR. Neuro  Consult appreciated.  EEG neg for seizures.CTA of head and neck neg Patient becoming more combative and  uncooperative.Not able to have conversation or reasoning. Moves all 4s equally. Attempts at obtaining MRI of the brain unsuccessful due to patients mental status.  D/W critical care to evaluate for possible  medical reasons for mental status changes,drug or chemical or, metabolic  Induced , and sedation to allow for furtherwork up . S.Eunie Lawn MD

## 2019-06-26 NOTE — Sedation Documentation (Signed)
Right groin sheath removed, 13fr. Angioseal closure device used.

## 2019-06-26 NOTE — Progress Notes (Signed)
ANTICOAGULATION CONSULT NOTE - Initial Consult  Pharmacy Consult for heparin Indication: s/p neuro-interventional radiological procedure  Patient Measurements: Height: 5\' 11"  (180.3 cm) Weight: 163 lb (73.9 kg) IBW/kg (Calculated) : 75.3   Vital Signs: Temp: 98.2 F (36.8 C) (12/23 1800) Temp Source: Axillary (12/23 1800) BP: 103/76 (12/23 2000) Pulse Rate: 67 (12/23 2000)  Labs: Recent Labs    06/26/19 0635 06/26/19 1816 06/26/19 1820  HGB 14.8 13.9  --   HCT 44.7 41.0  --   PLT 96*  --   --   LABPROT 13.8  --   --   INR 1.1  --   --   HEPARINUNFRC  --   --  <0.10*  CREATININE 0.99  --   --     Medical History: Past Medical History:  Diagnosis Date  . Aneurysm ([place])   . Anxiety   . Depression   . GERD (gastroesophageal reflux disease)   . Headache   . Wears contact lenses     Assessment: 28 yr old male admitted with daily headaches. Pt had an angiogram completed in [place] revealed an aneurysm in the basilar artery. He was admitted today for angiogram and embolization of aneurysm. Stent in place. Starting low dose heparin. Pt is thrombocytopenic at baseline. He was taking aspirin and Plavix prior to admission.   Pt was started on heparin infusion at 500 units/hr post procedure; pharmacy was consulted and entered order to increase heparin infusion to 650 units/hr, but RN stated that pt's heparin IV was still infusing at 500 units/hr until ~20:30 PM, when the heparin infusion was increased to the ordered rate of 650 units/hr. Pt had 6-hr heparin level drawn at 18:20 PM, which was <0.10 units/ml (below the goal range for this pt). Since pt had not been receiving the ordered heparin infusion rate of 650 units/hr, advised RN to set heparin IV on pump at 650 units/hr and check heparin level 6 hrs later.  H/H 13.9/41.0, platelets 96; per RN, no bleeding observed  Goal of Therapy:  Heparin level 0.1-0.25 units/ml Monitor platelets by anticoagulation protocol:  Yes   Plan:  -Increase heparin to 650 units/hr as ordered (turn off heparin at 0800) -Check 6-hr heparin level -Monitor daily heparin level, CBC -Monitor for signs/symptoms of bleeding  Gillermina Hu, PharmD, BCPS, [facility] Clinical Pharmacist 06/26/2019,8:39 PM

## 2019-06-26 NOTE — Anesthesia Procedure Notes (Signed)
Arterial Line Insertion Start/End12/23/2020 8:00 AM, 06/26/2019 8:05 AM Performed by: Amadeo Garnet, CRNA, CRNA  Patient location: Pre-op. Preanesthetic checklist: patient identified, IV checked, site marked, risks and benefits discussed, surgical consent, monitors and equipment checked, pre-op evaluation and timeout performed Lidocaine 1% used for infiltration and patient sedated Left, radial was placed Catheter size: 20 G Hand hygiene performed  and maximum sterile barriers used   Attempts: 1 Procedure performed without using ultrasound guided technique. Following insertion, Biopatch and dressing applied. Patient tolerated the procedure well with no immediate complications.

## 2019-06-26 NOTE — Procedures (Signed)
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 S.Momodou Consiglio MD

## 2019-06-26 NOTE — Progress Notes (Signed)
Patient assessed alongside Dr. Estanislado Pandy after intracranial stent placement this morning.  Called to bedside by RN due to patient complaining of "I cannot see. "  Patient found resting comfortably in PACU.  He repeatedly states "I cannot see" and "I am scared."  Patient difficult to redirect.  He is able to follow commands however has a difficult time focusing. He cannot describe what he is seeing.  Pupils equal and reactive. EOMs intact.  Blink to threat is inconsistent.  Sometimes states "oh! I can't see that."  When asked to "raise your arms like so" he mimics the action with his right arm.  When asked to raise the left, he follows without difficulty, but then drops arms and states "I can't see."  Initial exam completed without contacts.  His contacts were located and RN assisted in putting them in for patient.  His prescription is a -4.25 bilaterally with astigmatism.  However, when asked, patient reports he is far-sighted.  Patient reports slight improvement in his ability to see with contacts in, but continues to express vision changes.  Per Dr. Estanislado Pandy, obtained stat CT Angiogram Head to assess posterior circulation.  Neurology consult.   Continue with recovery in PACU.  Closely following.   Brynda Greathouse, MS RD PA-C

## 2019-06-26 NOTE — Progress Notes (Signed)
Patient transported to 4N26 from PACU without any complications.

## 2019-06-26 NOTE — Consult Note (Addendum)
NAME:  Keyon Liller, MRN:  [id number], DOB:  22-Apr-1991, LOS: 0 ADMISSION DATE:  06/26/2019, CONSULTATION DATE:  12/23 REFERRING MD:  Dr. 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  Patient began displaying signs of altered mental status with complaints of vision loss that progressively worsened to severe agitation and combativeness.  PCCM consulted for further management.  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 Per IR team has been having difficulty getting procedure scheduled due to transportation issues and medication compliance.  Patient has been experiencing headaches with intermittent nausea prior to admission.  Denies any vision changes or other neurological symptoms.  Patient successfully underwent angiogram embolization of basilar aneurysm with Dr. Estanislado Pandy 12/23 without any acute complications.  However on repeat evaluation in PACU patient began experiencing vision changes stating "I cannot see" it appears that around the same time patient became confused with mild agitation.  Agitation quickly worsened and patient became significantly combative.  Neurology consulted and recommended obtaining MRI of the brain but due to combativeness unable to obtain.  Therefore decision was made to administer deep sedation to ensure compliance with imaging and  patient was intubated for airway protection prior to sedation.  PCCM consulted for further management  Past Medical History  Depression Prior suicidal ideations Basal artery aneurysm embolization GERD Headache  Significant Hospital Events   12/23 admitted for elective basilar artery aneurysm embolization  Consults:   Neurology PCCM  Procedures:  12/23 basilar artery aneurysm embolization 12/23 intubated   Significant Diagnostic Tests:  CT angio head and neck 12/23 >  1. Arterial stent from the mid basilar artery into the left P2 segment. 2. Decreased caliber of the left P2 segment just beyond the stent is likely exaggerated by artifact. 3. No distal branch vessel occlusion to explain visual loss. 4. No occlusive disease involving the right PCA branch vessels. 5. No acute infarct is evident. 6. Normal CTA of the neck.  Micro Data:  MRSA PCR 12/23 > Negative   Antimicrobials:   Ancef periop x1  Interim history/subjective:  Severely agitated and combative in PACU   Objective   Blood pressure (!) 126/91, pulse 95, temperature (!) 97.3 F (36.3 C), resp. rate (!) 23, height 5\' 11"  (1.803 m), weight 73.9 kg, SpO2 99 %.        Intake/Output Summary (Last 24 hours) at 06/26/2019 1733 Last data filed at 06/26/2019 1224 Gross per 24 hour  Intake 1400 ml  Output 5 ml  Net 1395 ml   Filed Weights   06/26/19 0725  Weight: 73.9 kg    Examination: General: Adult male on mechanical ventilation, in NAD HEENT: ETT, MM pink/moist, PERRL,  Neuro: Severely agitated prior to intubation, unable to follow commands   CV: s1s2 regular rate and rhythm, no murmur, rubs, or gallops,  PULM:  Clear to ascultation bilaterally, no increased work of breathing GI: soft, bowel sounds active in all 4 quadrants, non-tender, non-distended Extremities: warm/dry, no edema  Skin: no rashes or lesions  Resolved Hospital Problem list     Assessment & Plan:  This is a 28 year old male who is being admitted status post basilar artery aneurysm embolization with postprocedure bilateral blindness, expressive aphasia and  severe encephalopathy who required intubation, mechanical ventilation and sedation due to confusion and combativeness precluding ability to obtain MRI for definitive assessment of these acute mental  status changes postprocedure.   Severe encephalopathy Plan Admit to neuro ICU per neuro services MRI per neuro Neurochecks Goal systolic blood pressure less than 140 Neuroprotective measures: Maintain euthermia, euglycemia, eunatremia, normoxia, pCO2 35-40, nutrition and bowel regimen, seizure precautions, head of bed elevated Ventilator support to prevent eminent deterioration and further organ dysfunction from hypoxemia and hypercarbia.   Patient is at risk for sudden hypoxia, barotrauma and hemodynamic compromise.   Maintain SpO2 greater than or equal to 90%. Head of bed elevated 30 degrees. Plateau pressures less than 30 cm H20.  Follow chest x-ray, ABG.   SAT/SBT as tolerated-goal for early vent liberation. Bronchial hygiene. RT/bronchodilator protocol. Analgesia and sedation for CPOT and goal RASS 0 to -1    Best practice:  Diet: NPO Pain/Anxiety/Delirium protocol (if indicated): As needed fentanyl and propofol VAP protocol (if indicated): Yes DVT prophylaxis: SCDs, heparin drip per neuro-pharmacy consult GI prophylaxis: PPI Glucose control: History of diabetes.  Follow with BMP, hypoglycemia protocol Mobility: Bedrest Code Status: Full Family Communication: Updated by primary Disposition: Admit ICU  Labs   CBC: Recent Labs  Lab 06/26/19 0635  WBC 4.1  NEUTROABS 1.7  HGB 14.8  HCT 44.7  MCV 86.5  PLT 96*    Basic Metabolic Panel: Recent Labs  Lab 06/26/19 0635  NA 137  K 3.7  CL 103  CO2 26  GLUCOSE 88  BUN 7  CREATININE 0.99  CALCIUM 8.8*   GFR: Estimated Creatinine Clearance: 114 mL/min (by C-G formula based on SCr of 0.99 mg/dL). Recent Labs  Lab 06/26/19 0635  WBC 4.1    Liver Function Tests: No results for input(s): AST, ALT, ALKPHOS, BILITOT, PROT, ALBUMIN in the last 168 hours. No results for input(s): LIPASE, AMYLASE in the last 168 hours. No results for input(s): AMMONIA in the last 168 hours.  ABG    Component Value  Date/Time   TCO2 26 10/15/2014 1005     Coagulation Profile: Recent Labs  Lab 06/26/19 0635  INR 1.1    Cardiac Enzymes: No results for input(s): CKTOTAL, CKMB, CKMBINDEX, TROPONINI in the last 168 hours.  HbA1C: No results found for: HGBA1C  CBG: Recent Labs  Lab 06/26/19 1609  GLUCAP 108*    Review of Systems:   UTO as pt is intubated   Past Medical History  He,  has a past medical history of Aneurysm (HCC), Anxiety, Depression, GERD (gastroesophageal reflux disease), Headache, and Wears contact lenses.   Surgical History    Past Surgical History:  Procedure Laterality Date  . IR ANGIO INTRA EXTRACRAN SEL COM CAROTID INNOMINATE BILAT MOD SED  03/05/2019  . IR ANGIO VERTEBRAL SEL VERTEBRAL BILAT MOD SED  03/05/2019  . RECTAL SURGERY    . TOOTH EXTRACTION       Social History   reports that he has been smoking cigarettes. He has been smoking about 0.50 packs per day. He has never used smokeless tobacco. He reports current alcohol use. He reports that he does not use drugs.   Family History   His family history includes Diabetes in his father; Healthy in his mother; Thyroid disease in his mother.   Allergies No Known Allergies   Home Medications  Prior to Admission medications   Medication Sig Start Date End Date Taking? Authorizing Provider  aspirin 81 MG chewable tablet Chew 81  mg by mouth daily.   Yes [provider]  clopidogrel (PLAVIX) 75 MG tablet Take 150 mg by mouth daily.   Yes [provider]  ibuprofen (ADVIL) 200 MG tablet Take 600 mg by mouth every 6 (six) hours as needed for headache.   Yes [provider]  verapamil (CALAN) 80 MG tablet Take 1 tablet (80 mg total) by mouth 3 (three) times daily. 02/20/19   Drema DallasJaffe, Adam R, DO     Critical care time:   CRITICAL CARE Performed by: Delfin GantWhitney F Davis   Total critical care time: 39 minutes  Critical care time was exclusive of separately billable procedures and treating other  patients.  Critical care was necessary to treat or prevent imminent or life-threatening deterioration.  Critical care was time spent personally by me on the following activities: development of treatment plan with patient and/or surrogate as well as nursing, discussions with consultants, evaluation of patient's response to treatment, examination of patient, obtaining history from patient or surrogate, ordering and performing treatments and interventions, ordering and review of laboratory studies, ordering and review of radiographic studies, pulse oximetry and re-evaluation of patient's condition.  Delfin GantWhitney F Davis, NP-C [facility] Contact / Pager information can be found on Amion  06/26/2019, 5:56 PM  Attending Note:  28 year old male with embolization of a neck basilar apex aneurysm who presents to PCCM very combative post procedure and not orienting.  Upon evaluating the patient, he is not oriented and has 5 security guards holding him down with clear lungs on exam.  Patient was just extubated post IR procedure.  MRI was ordered and pending and neurology evaluated the patient.  Upon evaluating him, it is clear that the MRI will not be done, he failed precedex and is clearly a risk to himself at this point.  Decision was made to intubate for airway protection and for completion of the needed exam.  Intubated.  Full vent support.  ABG ordered and will adjust vent accordingly when he arrives to the ICU.  Ordered tox screen, ammonia level, f/u ABG.  PCCM will continue to follow.  The patient is critically ill with multiple organ systems failure and requires high complexity decision making for assessment and support, frequent evaluation and titration of therapies, application of advanced monitoring technologies and extensive interpretation of multiple databases.   Critical Care Time devoted to patient care services described in this note is  32  Minutes. This time reflects time of  care of this signee Dr Koren BoundWesam Semaja Lymon. This critical care time does not reflect procedure time, or teaching time or supervisory time of PA/NP/Med student/Med Resident etc but could involve care discussion time.  Alyson ReedyWesam G. Shakena Callari, M.D. [facility]

## 2019-06-26 NOTE — Transfer of Care (Signed)
Immediate Anesthesia Transfer of Care Note  Patient: Jacob Holder  Procedure(s) Performed: EMBOLIZATION (N/A )  Patient Location: PACU  Anesthesia Type:General  Level of Consciousness: awake, alert  and oriented  Airway & Oxygen Therapy: Patient Spontanous Breathing and Patient connected to face mask oxygen  Post-op Assessment: Report given to RN, Post -op Vital signs reviewed and stable and Patient moving all extremities  Post vital signs: Reviewed and stable  Last Vitals:  Vitals Value Taken Time  BP 126/83 06/26/19 1217  Temp    Pulse 90 06/26/19 1224  Resp 16 06/26/19 1224  SpO2 100 % 06/26/19 1224  Vitals shown include unvalidated device data.  Last Pain:  Vitals:   06/26/19 0718  PainSc: 0-No pain         Complications: No apparent anesthesia complications

## 2019-06-27 ENCOUNTER — Encounter: Payer: Self-pay | Admitting: *Deleted

## 2019-06-27 ENCOUNTER — Inpatient Hospital Stay (HOSPITAL_COMMUNITY): Payer: Managed Care, Other (non HMO)

## 2019-06-27 ENCOUNTER — Other Ambulatory Visit (HOSPITAL_COMMUNITY): Payer: Managed Care, Other (non HMO)

## 2019-06-27 DIAGNOSIS — H47619 Cortical blindness, unspecified side of brain: Secondary | ICD-10-CM

## 2019-06-27 DIAGNOSIS — I615 Nontraumatic intracerebral hemorrhage, intraventricular: Secondary | ICD-10-CM

## 2019-06-27 DIAGNOSIS — I609 Nontraumatic subarachnoid hemorrhage, unspecified: Secondary | ICD-10-CM

## 2019-06-27 DIAGNOSIS — I671 Cerebral aneurysm, nonruptured: Principal | ICD-10-CM

## 2019-06-27 DIAGNOSIS — R451 Restlessness and agitation: Secondary | ICD-10-CM

## 2019-06-27 DIAGNOSIS — R4701 Aphasia: Secondary | ICD-10-CM

## 2019-06-27 DIAGNOSIS — I63439 Cerebral infarction due to embolism of unspecified posterior cerebral artery: Secondary | ICD-10-CM

## 2019-06-27 LAB — CBC
HCT: 38.3 % — ABNORMAL LOW (ref 39.0–52.0)
Hemoglobin: 12.8 g/dL — ABNORMAL LOW (ref 13.0–17.0)
MCH: 28.2 pg (ref 26.0–34.0)
MCHC: 33.4 g/dL (ref 30.0–36.0)
MCV: 84.4 fL (ref 80.0–100.0)
Platelets: 103 10*3/uL — ABNORMAL LOW (ref 150–400)
RBC: 4.54 MIL/uL (ref 4.22–5.81)
RDW: 12.3 % (ref 11.5–15.5)
WBC: 9.7 10*3/uL (ref 4.0–10.5)
nRBC: 0 % (ref 0.0–0.2)

## 2019-06-27 LAB — BASIC METABOLIC PANEL
Anion gap: 10 (ref 5–15)
BUN: 5 mg/dL — ABNORMAL LOW (ref 6–20)
CO2: 23 mmol/L (ref 22–32)
Calcium: 8.6 mg/dL — ABNORMAL LOW (ref 8.9–10.3)
Chloride: 104 mmol/L (ref 98–111)
Creatinine, Ser: 0.71 mg/dL (ref 0.61–1.24)
GFR calc Af Amer: >60 mL/min (ref >60)
GFR calc non Af Amer: >60 mL/min (ref >60)
Glucose, Bld: 96 mg/dL (ref 70–99)
Potassium: 3.9 mmol/L (ref 3.5–5.1)
Sodium: 137 mmol/L (ref 135–145)

## 2019-06-27 LAB — PROCALCITONIN: Procalcitonin: 0.12 ng/mL

## 2019-06-27 LAB — PLATELET INHIBITION P2Y12: Platelet Function  P2Y12: 163 [PRU] — ABNORMAL LOW (ref 182–335)

## 2019-06-27 LAB — PHOSPHORUS: Phosphorus: 2.6 mg/dL (ref 2.5–4.6)

## 2019-06-27 LAB — HEPARIN LEVEL (UNFRACTIONATED): Heparin Unfractionated: <0.1 IU/mL — ABNORMAL LOW (ref 0.30–0.70)

## 2019-06-27 LAB — MAGNESIUM: Magnesium: 1.9 mg/dL (ref 1.7–2.4)

## 2019-06-27 LAB — TRIGLYCERIDES: Triglycerides: 223 mg/dL — ABNORMAL HIGH (ref <150)

## 2019-06-27 MED ORDER — ADULT MULTIVITAMIN W/MINERALS CH
1.0000 | ORAL_TABLET | Freq: Every day | ORAL | Status: DC
  Administered 2019-06-27 – 2019-06-30 (×4): 1 via ORAL
  Filled 2019-06-27 (×4): qty 1

## 2019-06-27 MED ORDER — DEXMEDETOMIDINE HCL IN NACL 400 MCG/100ML IV SOLN
0.4000 ug/kg/h | INTRAVENOUS | Status: DC
  Administered 2019-06-27: 1.2 ug/kg/h via INTRAVENOUS
  Administered 2019-06-27: 21:00:00 0.9 ug/kg/h via INTRAVENOUS
  Administered 2019-06-27: 14:00:00 1.2 ug/kg/h via INTRAVENOUS
  Administered 2019-06-28: 03:00:00 0.6 ug/kg/h via INTRAVENOUS
  Administered 2019-06-30: 02:00:00 0.4 ug/kg/h via INTRAVENOUS
  Filled 2019-06-27 (×4): qty 100

## 2019-06-27 MED ORDER — THIAMINE HCL 100 MG PO TABS
100.0000 mg | ORAL_TABLET | Freq: Every day | ORAL | Status: DC
  Administered 2019-06-27 – 2019-06-30 (×4): 100 mg via ORAL
  Filled 2019-06-27 (×4): qty 1

## 2019-06-27 MED ORDER — DEXMEDETOMIDINE BOLUS VIA INFUSION
1.0000 ug/kg | Freq: Once | INTRAVENOUS | Status: DC
  Filled 2019-06-27: qty 74

## 2019-06-27 MED ORDER — HALOPERIDOL LACTATE 5 MG/ML IJ SOLN
INTRAMUSCULAR | Status: AC
  Filled 2019-06-27: qty 1

## 2019-06-27 MED ORDER — FOLIC ACID 1 MG PO TABS
1.0000 mg | ORAL_TABLET | Freq: Every day | ORAL | Status: DC
  Administered 2019-06-27 – 2019-06-30 (×4): 1 mg via ORAL
  Filled 2019-06-27 (×4): qty 1

## 2019-06-27 MED ORDER — PROTAMINE SULFATE 10 MG/ML IV SOLN
INTRAVENOUS | Status: DC | PRN
  Administered 2019-06-26: 5 mg via INTRAVENOUS

## 2019-06-27 MED ORDER — HEPARIN (PORCINE) 25000 UT/250ML-% IV SOLN: 800.0000 [IU]/h | INTRAVENOUS | Status: DC

## 2019-06-27 MED ORDER — SODIUM CHLORIDE 0.9 % IV BOLUS
500.0000 mL | Freq: Once | INTRAVENOUS | Status: AC
  Administered 2019-06-27: 05:00:00 500 mL via INTRAVENOUS

## 2019-06-27 MED ORDER — HALOPERIDOL LACTATE 5 MG/ML IJ SOLN
5.0000 mg | Freq: Once | INTRAMUSCULAR | Status: AC
  Administered 2019-06-27: 5 mg via INTRAVENOUS

## 2019-06-27 MED ORDER — ORAL CARE MOUTH RINSE
15.0000 mL | Freq: Two times a day (BID) | OROMUCOSAL | Status: DC
  Administered 2019-06-27 – 2019-06-28 (×3): 15 mL via OROMUCOSAL

## 2019-06-27 MED ORDER — ASPIRIN 81 MG PO CHEW
81.0000 mg | CHEWABLE_TABLET | Freq: Every day | ORAL | Status: DC
  Administered 2019-06-28 – 2019-06-30 (×3): 81 mg via ORAL
  Filled 2019-06-27 (×3): qty 1

## 2019-06-27 MED ORDER — ASPIRIN 81 MG PO CHEW
81.0000 mg | CHEWABLE_TABLET | Freq: Every day | ORAL | Status: DC
  Administered 2019-06-27: 14:00:00 81 mg via NASOGASTRIC
  Filled 2019-06-27: qty 1

## 2019-06-27 MED ORDER — SODIUM CHLORIDE 0.9 % IV SOLN: INTRAVENOUS | Status: DC

## 2019-06-27 MED ORDER — CLOPIDOGREL BISULFATE 75 MG PO TABS
75.0000 mg | ORAL_TABLET | Freq: Every day | ORAL | Status: DC
  Administered 2019-06-28 – 2019-06-30 (×3): 75 mg via ORAL
  Filled 2019-06-27 (×3): qty 1

## 2019-06-27 MED ORDER — PHENYLEPHRINE HCL-NACL 10-0.9 MG/250ML-% IV SOLN
0.0000 ug/min | INTRAVENOUS | Status: DC
  Filled 2019-06-27: qty 250

## 2019-06-27 MED ORDER — CLOPIDOGREL BISULFATE 75 MG PO TABS
75.0000 mg | ORAL_TABLET | Freq: Every day | ORAL | Status: DC
  Administered 2019-06-27: 75 mg via NASOGASTRIC
  Filled 2019-06-27: qty 1

## 2019-06-27 NOTE — Progress Notes (Signed)
Patient ID: Jacob Holder, male   DOB: [birth year], 28 y.o.   MRN: [id number] INR. Spoke to patients mother  [name] over the phone. Updated her regarding  [name] condition. Informed her of the ischemic changes and of the [facility] seen on the MRI .  Let her know that the patient was on a breathing machine to maintain and protect his airway. Was also informed that patients condition could get worse due to more strokes and SAH resulting  more neurological injury.. Patient would remain in the ICU for close monitoring for the time being.Marland Kitchen  S.Kelseigh Diver MD   Mothers tel  number. [phone]

## 2019-06-27 NOTE — Progress Notes (Signed)
MRI brain completed. 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 Also noted is T1 shortening in the perimedullary CSF space, prepontine cistern on the left, and perimesencephalic cistern on the left, suggestive of subarachnoid blood or contrast extravasation.   Awaiting official Radiology report.   Electronically signed: Dr. Kerney Elbe

## 2019-06-27 NOTE — Progress Notes (Addendum)
Repeat CT head being ordered to further evaluate the subarachnoid hemorrhage seen on MRI. Discussed with Radiology.   Will call CCM for assistance regarding BP management given hypotension, with BP not to goal after initial 500 cc NS bolus. Additional 500 cc bolus is being administered.   Electronically signed: Dr. Kerney Elbe

## 2019-06-27 NOTE — Progress Notes (Signed)
Pt vomited post extubation. RN x 3 attempting to bathe him. He is swatting at [place] and screaming "what the f_ck?". We got him cleaned then he vomited again. Additional sedation requested from MD for our safety. VSS 84, 100%, 148/88, 19. Will re-bathe when sedation is optimized.

## 2019-06-27 NOTE — Progress Notes (Signed)
Heparin gtt turned off per order.

## 2019-06-27 NOTE — Procedures (Signed)
Extubation Procedure Note  Patient Details:   Name: Jacob Holder DOB: 18-Jun-1991 MRN: [id number]   Airway Documentation:    Vent end date: 06/27/19 Vent end time: 1035   Evaluation  O2 sats: stable throughout Complications: No apparent complications Patient did tolerate procedure well. Bilateral Breath Sounds: Clear   Yes.  Pt extubated to room air per physician's order.  Earney Navy 06/27/2019, 10:35 AM

## 2019-06-27 NOTE — Anesthesia Postprocedure Evaluation (Signed)
Anesthesia Post Note  Patient: Jacob Holder  Procedure(s) Performed: EMBOLIZATION (N/A )     Anesthesia Post Evaluation  Last Vitals:  Vitals:   06/27/19 0752 06/27/19 0800  BP:    Pulse:    Resp:    Temp:  (!) 36.1 C  SpO2: 100%     Last Pain:  Vitals:   06/27/19 0800  TempSrc: Axillary  PainSc:                  Belenda Cruise Olga Bourbeau

## 2019-06-27 NOTE — Progress Notes (Addendum)
Patient ID: Juno Bozard, male   DOB: 1990/12/20, 28 y.o.   MRN: [id number] INR. Post procedure da1. Patient remains intubated and sedated. Will move all 4s equally when sedation with drawn. Will open eyes to name. Remains combative without sedation.RT groin soft. Distal pulses dopplerable bilaterally. MRI of the brain reviewed. DWI restrictions noted in the Lt parietal occiptal,bilateral occiptal and RT PICA distributions.related to a combination of drug induced vasospasm and secondary clot formation. Also noted focal SAH in the LT perimesencephalic cistern ? from aneurysm leak .Marland Kitchen  Plan. D/C IV heparin. Continue on aspirin 81 mg and plavix for now. May need to D/C plavix if hemophage worsens. P2 Y 12. Maintain BP MAP in the 80s and increase IV fluids to 100 cc/hr.. Vent as per CCM . Repeat CT in am. S.Zaion Hreha MD.  PS. Drug screen reveals presence of cocaine and benzodiazepines

## 2019-06-27 NOTE — Evaluation (Signed)
Clinical/Bedside Swallow Evaluation Patient Details  Name: Jacob Holder MRN: [id number] Date of Birth: 10-05-1990  Today's Date: 06/27/2019 Time: SLP Start Time (ACUTE ONLY): 1420 SLP Stop Time (ACUTE ONLY): 1428 SLP Time Calculation (min) (ACUTE ONLY): 8 min  Past Medical History:  Past Medical History:  Diagnosis Date  . Aneurysm (HCC)   . Anxiety   . Depression   . GERD (gastroesophageal reflux disease)   . Headache   . Wears contact lenses    Past Surgical History:  Past Surgical History:  Procedure Laterality Date  . IR ANGIO INTRA EXTRACRAN SEL COM CAROTID INNOMINATE BILAT MOD SED  03/05/2019  . IR ANGIO VERTEBRAL SEL VERTEBRAL BILAT MOD SED  03/05/2019  . RADIOLOGY WITH ANESTHESIA N/A 06/26/2019   Procedure: EMBOLIZATION;  Surgeon: Julieanne Cotton, MD;  Location: [facility] OR;  Service: Radiology;  Laterality: N/A;  . RECTAL SURGERY    . 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 Per IR team has been having difficulty getting procedure scheduled due to transportation issues and medication compliance.  Patient has been experiencing headaches with intermittent nausea prior to admission.  Denies any vision changes or other neurological symptoms. Patient successfully underwent angiogram embolization of basilar aneurysm with Dr. Corliss Skains 12/23 without any acute complications.  However on repeat evaluation in PACU patient began experiencing vision changes stating "I cannot see" patient became significantly combative.  Pt sedated and intubated for airway protection for MRI.  MRI shows Basilar cistern subarachnoid hemorrhage and small volume intraventricular hemorrhage. 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   Assessment / Plan / Recommendation Clinical Impression  Pt  demonstrates impaired cognition and awareness, but is able to take consecutive sips of thin liquids without difficulty. Did not accept solids, but RN has observed pt to tolerate puree. Suspect pt will be able to masticate without difficulty as long as he is aware of PO. Will f/u for tolerance of diet and to assess language and cognition.  SLP Visit Diagnosis: Dysphagia, oropharyngeal phase (R13.12)    Aspiration Risk  Mild aspiration risk    Diet Recommendation Regular;Thin liquid   Liquid Administration via: Cup;Straw Medication Administration: Whole meds with liquid Supervision: Full supervision/cueing for compensatory strategies Compensations: Slow rate;Small sips/bites Postural Changes: Seated upright at 90 degrees    Other  Recommendations Oral Care Recommendations: Oral care BID   Follow up Recommendations Inpatient Rehab      Frequency and Duration min 2x/week  2 weeks       Prognosis        Swallow Study   General HPI: Jacob Holder is a 28 year old male with a 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 Per IR team has been having difficulty getting procedure scheduled due to transportation issues and medication compliance.  Patient has been experiencing headaches with intermittent nausea prior to admission.  Denies any vision changes or other neurological symptoms. Patient successfully underwent angiogram embolization of basilar aneurysm with Dr. Corliss Skains 12/23 without any acute complications.  However on repeat evaluation in PACU patient began experiencing vision changes stating "I cannot see" patient became significantly combative.  Pt sedated and intubated for airway protection for MRI.  MRI shows Basilar cistern subarachnoid hemorrhage  and small volume intraventricular hemorrhage. 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 Type  of Study: Bedside Swallow Evaluation Previous Swallow Assessment: none Diet Prior to this Study: NPO Temperature Spikes Noted: No Respiratory Status: Room air History of Recent Intubation: Yes Length of Intubations (days): 1 days Behavior/Cognition: Confused Oral Care Completed by SLP: No Oral Cavity - Dentition: Adequate natural dentition Vision: Impaired for self-feeding Self-Feeding Abilities: Total assist Patient Positioning: Upright in bed Baseline Vocal Quality: Low vocal intensity Volitional Cough: Cognitively unable to elicit Volitional Swallow: Unable to elicit    Oral/Motor/Sensory Function Overall Oral Motor/Sensory Function: (unable to follow commands)   Ice Chips Ice chips: Not tested   Thin Liquid Thin Liquid: Within functional limits Presentation: Straw    Nectar Thick Nectar Thick Liquid: Not tested   Honey Thick Honey Thick Liquid: Not tested   Puree Puree: (refused)   Solid     Solid: Not tested(refused)     Herbie Baltimore, MA CCC-SLP  Acute Rehabilitation Services Pager [phone] Office [phone]  Shandria Clinch, Katherene Ponto 06/27/2019,2:57 PM

## 2019-06-27 NOTE — Progress Notes (Signed)
STROKE TEAM PROGRESS NOTE   INTERVAL HISTORY I personally reviewed history of presenting illness, electronic medical records and imaging films in PACS.  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  Patient was intubated yesterday in PACU for agitation.  MRI scan of the brain was obtained which shows bilateral parietal, left occipital and right cerebellar infarcts.  There is small amount of basilar cistern subarachnoid hemorrhage.  Patient blood pressures well controlled.  He is intubated but sitting up in bed and wanting to be extubated. EEG shows no definite seizure activity but is suggestive of moderate diffuse encephalopathy nonspecific in etiology Vitals:   06/27/19 0700 06/27/19 0730 06/27/19 0752 06/27/19 0800  BP: 110/83 104/74    Pulse: 67 67    Resp: 16 16    Temp:    (!) 97 F (36.1 C)  TempSrc:    Axillary  SpO2: 100% 100% 100%   Weight:      Height:        CBC:  Recent Labs  Lab 06/26/19 0635 06/26/19 1816 06/27/19 0252  WBC 4.1  --  9.7  NEUTROABS 1.7  --   --   HGB 14.8 13.9 12.8*  HCT 44.7 41.0 38.3*  MCV 86.5  --  84.4  PLT 96*  --  103*    Basic Metabolic Panel:  Recent Labs  Lab 06/26/19 0635 06/26/19 1816 06/27/19 0252  NA 137 140 137  K 3.7 3.8 3.9  CL 103  --  104  CO2 26  --  23  GLUCOSE 88  --  96  BUN 7  --  5*  CREATININE 0.99  --  0.71  CALCIUM 8.8*  --  8.6*  MG  --   --  1.9  PHOS  --   --  2.6   Lipid Panel:     Component Value Date/Time   TRIG 223 (H) 06/27/2019 0252   HgbA1c:  Lab Results  Component Value Date   HGBA1C 5.5 06/26/2019   Urine Drug Screen:     Component Value Date/Time   LABOPIA NONE DETECTED 06/26/2019 1828   COCAINSCRNUR POSITIVE (A) 06/26/2019 1828   LABBENZ POSITIVE (A) 06/26/2019 1828   AMPHETMU NONE DETECTED 06/26/2019 1828   THCU NONE DETECTED 06/26/2019 1828   LABBARB NONE DETECTED 06/26/2019 1828    Alcohol Level     Component Value  Date/Time   ETH <5 11/30/2016 1358    IMAGING EEG  Result Date: 06/26/2019 Charlsie Quest, MD     06/26/2019  4:45 PM Patient Name: Jacob Holder MRN: [id number] Epilepsy Attending: Charlsie Quest Referring Physician/Provider: Felicie Morn, PA Date: 06/26/2019 Duration: 22.25 mins Patient history: 28 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  EEG to evaluate for seizures. Level of alertness: Awake AEDs during EEG study: None Technical aspects: This EEG study was done with scalp electrodes positioned according to the 10-20 International system of electrode placement. Electrical activity was acquired at a sampling rate of  and reviewed with a high frequency filter of  and a low frequency filter of . EEG data were recorded continuously and digitally stored. Description: EEG was technically difficult due to significant myogenic artifact. No clear posterior dominant rhythm was seen. EEG showed continuous generalized 3-5 hz theta-delta slowing. Hyperventilation and photic stimulation were not performed. Abnormality - Continuous slow, generalized IMPRESSION: This technically difficult study is suggestive of moderate diffuse encephalopathy, non  specific to etiology. No definite seizures or epileptiform discharges were seen throughout the recording. Priyanka Annabelle Harman   CT ANGIO HEAD W OR WO CONTRAST  Result Date: 06/26/2019 CLINICAL DATA:  Loss of vision after neuro interventional procedure. Placement neuroform atlas stent across a wide necked basilar tip aneurysm. EXAM: CT ANGIOGRAPHY HEAD AND NECK TECHNIQUE: Multidetector CT imaging of the head and neck was performed using the standard protocol during bolus administration of intravenous contrast. Multiplanar CT image reconstructions and MIPs were obtained to evaluate the vascular anatomy. Carotid stenosis measurements (when applicable) are obtained utilizing NASCET criteria, using  the distal internal carotid diameter as the denominator. CONTRAST:  75mL OMNIPAQUE IOHEXOL 350 MG/ML SOLN COMPARISON:  None. FINDINGS: CTA NECK FINDINGS Aortic arch: A 3 vessel arch configuration is present. No significant atherosclerotic changes are present. No aneurysm or stenosis. Right carotid system: The right common carotid artery is within normal limits. Bifurcation is unremarkable. Cervical right ICA is normal. Left carotid system: The left common carotid artery is within normal limits. Bifurcation is unremarkable. The cervical left ICA is normal. Vertebral arteries: The vertebral arteries are codominant. There is no significant stenosis in either vertebral artery in the neck. Vertebral arteries originate the subclavian arteries without significant stenosis. Skeleton: Vertebral body heights alignment are maintained. Focal lytic or blastic lesions are present. Other neck: Soft tissues the neck are otherwise unremarkable. Upper chest: Lung apices are clear.  Thoracic inlet is normal. Review of the MIP images confirms the above findings CTA HEAD FINDINGS Anterior circulation: The internal carotid arteries are within normal limits through the ICA termini bilaterally. 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 Both A2 segments fill from right. M1 segments are normal bilaterally. MCA bifurcations are normal. ACA and MCA branch vessels are within normal limits. No significant proximal stenosis or occlusion is present. Posterior circulation: The vertebral arteries are codominant. PICA origins are visualized and normal. Vertebrobasilar junction is normal. Stent is visualized from the distal basilar artery into the left P2 segment. There is decreased caliber of the vessel just distal to the stent. The appearance is likely exaggerated due to artifact. Distal branch vessels fill in the left PCA branches. Right PCA branches are within normal limits. Venous sinuses: The dural sinuses are patent. The straight sinus and deep cerebral  veins are intact. Cortical veins are within normal limits. No vascular malformation is evident. Anatomic variants: None Review of the MIP images confirms the above findings IMPRESSION: 1. Arterial stent from the mid basilar artery into the left P2 segment. 2. Decreased caliber of the left P2 segment just beyond the stent is likely exaggerated by artifact. 3. No distal branch vessel occlusion to explain visual loss. 4. No occlusive disease involving the right PCA branch vessels. 5. No acute infarct is evident. 6. Normal CTA of the neck. These results were called by telephone at the time of interpretation on 06/26/2019 at 2:26 pm to provider [facility], who verbally acknowledged these results. Electronically Signed   By: Marin Roberts M.D.   On: 06/26/2019 14:40   CT HEAD WO CONTRAST  Result Date: 06/27/2019 CLINICAL DATA:  28 year old male status post basilar tip aneurysm stenting with loss of vision. Follow-up MRI reveals evidence of basilar subarachnoid hemorrhage and IVH. EXAM: CT HEAD WITHOUT CONTRAST TECHNIQUE: Contiguous axial images were obtained from the base of the skull through the vertex without intravenous contrast. COMPARISON:  Brain MRI 0335 hours today and earlier. FINDINGS: Brain: Small volume intraventricular hemorrhage in the occipital horns and 4th  ventricle, also visible by MRI. Basilar cistern subarachnoid hemorrhage primarily on the left: Interpeduncular, ambient cistern, prepontine, left CP angle, and pre medullary. The CT appearance is less convincing for subdural hematoma at the cisterna magna. Stable ventricle size, no ventriculomegaly. Patchy somewhat subtle hypodensity in the posterior parietal lobes, left occipital lobe and right cerebellum corresponding to the acute infarcts by MRI. No parenchymal hemorrhage. No significant intracranial mass effect at this time. Gray-white matter differentiation elsewhere remains within normal limits. Vascular: Basilar artery through  left PCA vascular stent is evident. Skull: No acute osseous abnormality identified. Sinuses/Orbits: Visualized paranasal sinuses and mastoids are stable and well pneumatized. Other: Intubated. Fluid in the pharynx. No acute orbit or scalp soft tissue finding. IMPRESSION: 1. Basilar cistern subarachnoid hemorrhage is primarily on the left and stable from the MRI earlier today. No superimposed subdural hematoma suspected. No intra-axial blood identified. 2. Trace associated IVH. No ventriculomegaly or significant intracranial mass effect at this time. 3. Patchy hypodensity associated with the acute parietal, left occipital, and right cerebellar infarcts. Electronically Signed   By: Odessa FlemingH  Hall M.D.   On: 06/27/2019 07:21   CT ANGIO NECK W OR WO CONTRAST  Result Date: 06/26/2019 CLINICAL DATA:  Loss of vision after neuro interventional procedure. Placement neuroform atlas stent across a wide necked basilar tip aneurysm. EXAM: CT ANGIOGRAPHY HEAD AND NECK TECHNIQUE: Multidetector CT imaging of the head and neck was performed using the standard protocol during bolus administration of intravenous contrast. Multiplanar CT image reconstructions and MIPs were obtained to evaluate the vascular anatomy. Carotid stenosis measurements (when applicable) are obtained utilizing NASCET criteria, using the distal internal carotid diameter as the denominator. CONTRAST:  75mL OMNIPAQUE IOHEXOL 350 MG/ML SOLN COMPARISON:  None. FINDINGS: CTA NECK FINDINGS Aortic arch: A 3 vessel arch configuration is present. No significant atherosclerotic changes are present. No aneurysm or stenosis. Right carotid system: The right common carotid artery is within normal limits. Bifurcation is unremarkable. Cervical right ICA is normal. Left carotid system: The left common carotid artery is within normal limits. Bifurcation is unremarkable. The cervical left ICA is normal. Vertebral arteries: The vertebral arteries are codominant. There is no  significant stenosis in either vertebral artery in the neck. Vertebral arteries originate the subclavian arteries without significant stenosis. Skeleton: Vertebral body heights alignment are maintained. Focal lytic or blastic lesions are present. Other neck: Soft tissues the neck are otherwise unremarkable. Upper chest: Lung apices are clear.  Thoracic inlet is normal. Review of the MIP images confirms the above findings CTA HEAD FINDINGS Anterior circulation: The internal carotid arteries are within normal limits through the ICA termini bilaterally. 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 Both A2 segments fill from right. M1 segments are normal bilaterally. MCA bifurcations are normal. ACA and MCA branch vessels are within normal limits. No significant proximal stenosis or occlusion is present. Posterior circulation: The vertebral arteries are codominant. PICA origins are visualized and normal. Vertebrobasilar junction is normal. Stent is visualized from the distal basilar artery into the left P2 segment. There is decreased caliber of the vessel just distal to the stent. The appearance is likely exaggerated due to artifact. Distal branch vessels fill in the left PCA branches. Right PCA branches are within normal limits. Venous sinuses: The dural sinuses are patent. The straight sinus and deep cerebral veins are intact. Cortical veins are within normal limits. No vascular malformation is evident. Anatomic variants: None Review of the MIP images confirms the above findings IMPRESSION: 1. Arterial stent from the  mid basilar artery into the left P2 segment. 2. Decreased caliber of the left P2 segment just beyond the stent is likely exaggerated by artifact. 3. No distal branch vessel occlusion to explain visual loss. 4. No occlusive disease involving the right PCA branch vessels. 5. No acute infarct is evident. 6. Normal CTA of the neck. These results were called by telephone at the time of interpretation on 06/26/2019 at 2:26  pm to provider [facility], who verbally acknowledged these results. Electronically Signed   By: Marin Roberts M.D.   On: 06/26/2019 14:40   MR BRAIN WO CONTRAST  Addendum Date: 06/27/2019   ADDENDUM REPORT: 06/27/2019 06:27 ADDENDUM: Study discussed by telephone with Dr. Caryl Pina on 06/27/2019 at 0616 hours. Electronically Signed   By: Odessa Fleming M.D.   On: 06/27/2019 06:27   Result Date: 06/27/2019 CLINICAL DATA:  28 year old male with loss of vision after NIR procedure, stent placed across basilar tip aneurysm. EXAM: MRI HEAD WITHOUT CONTRAST TECHNIQUE: Multiplanar, multiecho pulse sequences of the brain and surrounding structures were obtained without intravenous contrast. COMPARISON:  CTA head and neck yesterday.  Brain MRI 02/22/2019. FINDINGS: Brain: There is new high FLAIR signal and intermediate T1 signal in the basilar cisterns and posterior fossa suspicious for subarachnoid hemorrhage, including in the cisterna magna. Component of subdural blood is felt less likely. And there is associated intraventricular hemorrhage layering in the occipital horns, also the 4th ventricle. No superimposed ventriculomegaly. No midline shift or significant intracranial mass effect. Patchy areas of cortical and white matter restricted diffusion in both posterior parietal lobes, superior occipital lobes, the left occipital pole, left lateral occipital lobe, and a small area of the anterior and medial occipital lobe. Superimposed similar patchy restricted diffusion in the right cerebellum including the right tonsil. Cytotoxic edema with no parenchymal blood identified. No definite superimposed brainstem restricted diffusion. No thalamic restricted diffusion. No anterior circulation restricted diffusion identified. Stable gray and white matter signal in the anterior circulation. Vascular: Major intracranial vascular flow voids appear stable compared to August. Basilar through left PCA stent better  demonstrated by CTA. Skull and upper cervical spine: No definite hemorrhage in the visible upper cervical spine. Bone marrow signal remains normal. Sinuses/Orbits: Intubated. Fluid in the pharynx. Paranasal sinuses remain well pneumatized. Negative orbits. Other: Mastoids remain clear. IMPRESSION: 1. Basilar cistern subarachnoid hemorrhage and small volume intraventricular hemorrhage appears new from the CTA yesterday. Difficult to exclude a component of subdural hematoma at the foramen magnum. 2. No ventriculomegaly or significant intracranial mass effect at this time. 3. 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 No brainstem or anterior circulation involvement identified. No intra-axial hemorrhage identified. 4. Major intracranial vascular flow voids appear stable. Dr. Otelia Limes with Neurology was paged via AMION regarding the findings this exam on 06/27/2019 at 0553 hours. Electronically Signed: By: Odessa Fleming M.D. On: 06/27/2019 06:01   DG Chest Port 1 View  Result Date: 06/27/2019 CLINICAL DATA:  Endotracheal tube.  Recent aneurysm embolization. EXAM: PORTABLE CHEST 1 VIEW COMPARISON:  None. FINDINGS: Endotracheal tube has tip 7.8 cm above the carina. Lungs are adequately inflated without focal airspace consolidation or effusion. Cardiomediastinal silhouette is within normal. Remaining bones and soft tissues are normal. IMPRESSION: No active disease. Endotracheal tube with tip 7.8 cm above the carina. Electronically Signed   By: Elberta Fortis M.D.   On: 06/27/2019 07:10    PHYSICAL EXAM Young African-American male is intubated and sedated.  He is  restless. . Afebrile. Head is nontraumatic. Neck is supple without bruit.    Cardiac exam no murmur or gallop. Lungs are clear to auscultation. Distal pulses are well felt. Neurological Exam ;  Patient is intubated and sedated but can be easily aroused.  He does not follow commands.  He does not blink to  threat on either side.  Pupils are equal reactive.  Fundi not visualized.  There is no facial weakness.  Tongue midline.  Is able to move all 4 extremities equally well against gravity without any focal weakness. ASSESSMENT/PLAN Jacob Holder is a 28 y.o. 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 Arterial stent from the mid basilar artery into the left P2segment. 2. Decreased caliber of the left P2 segment just beyond the stent is likely exaggerated by artifact. Normal CTA of the neck.  MRI  Basilar cistern SAH and small IVH new from yesterday. ? SDH. Scattered acute infarcts B parietal, L occipital, R cerebellar.  CT head L basilar SAH stable. Trace IVH. Patchy hypodensity parietal, L occipital, R cerebellar infarcts. Post IR CT Basilar cistern subarachnoid hemorrhage is primarily on the left and stable from the MRI earlier today.No superimposed subdural hematoma suspected. No intra-axial blood identified.Trace associated IVH. No ventriculomegaly or significant intracranial mass effect at this time.     2D Echo pending  LDL pending    HgbA1c 5.5  SCDs for VTE prophylaxis  aspirin 81 mg daily and clopidogrel 75 mg daily prior to admission, stopped IV heparinat 0800. now on aspirin 81 mg daily and clopidogrel 75 mg daily.  Therapy recommendations:  pending  Disposition:  pending  Acute Respiratory Failure  Intubated secondary to extreme agitation  Start precedex  CCM on board  For extubation later today  Hypertension  Home meds:  verapamil 80 mg  tid    BP goal 120-140 per IR x 24h  Multiple fluid boluses required to keep BP up   IVF increased to 100 this am  Stable but soft . Long-term BP goal normotensive  Dysphagia . Secondary to stroke . NPO . Speech on board   Other Stroke Risk Factors  Cigarette smoker, advised to stop smoking  ETOH use, alcohol level <5, advised to drink no more than 2 drink(s) a day  Substance abuse - UDS:  Cocaine POSITIVE. Patient advised to stop using due to stroke risk.  Other Active Problems  Anxiety/depression  GERD  Hospital day # 1  I have personally obtained history,examined this patient, reviewed notes, independently viewed imaging studies, participated in medical decision making and plan of care.ROS completed by me personally and pertinent positives fully documented  I have made any additions or clarifications directly to the above note.  Patient came in for elective basilar tip aneurysm coiling and postprocedure was noted to be agitated confused and blind and requiring intubation and sedation.  MRI scan subsequently performed shows by parietal left occipital and right cerebellar infarcts likely of embolic etiology from the procedure.  However urine drug screen is also positive for cocaine and recommend check echocardiogram to assess  cardiac function and monitor for cardiac arrhythmias.  Continue aspirin and Plavix for his intracranial stent.  Extubate today.  Discussed with Dr. Rodman Pickle critical care medicine and Dr. Estanislado Pandy. This patient is critically ill and at significant risk of neurological worsening, death and care requires constant monitoring of vital signs, hemodynamics,respiratory and cardiac monitoring, extensive review of multiple databases, frequent neurological assessment, discussion with family, other specialists and medical decision making of high complexity.I have made any additions or clarifications directly to the above note.This critical care time does not reflect  procedure time, or teaching time or supervisory time of PA/NP/Med Resident etc but could involve care discussion time.  I spent 30 minutes of neurocritical care time  in the care of  this patient.      Antony Contras, MD Medical Director [facility] Pager: [phone] 06/27/2019 3:14 PM   To contact Stroke Continuity provider, please refer to [email or web address] After hours, contact General Neurology

## 2019-06-27 NOTE — Progress Notes (Signed)
Pt BP not increasing within range of SBP 120-140 after MRI and sedation needed for MRI.   Paged neuro, ordered 500cc bolus and to call back in 30 minutes with further instructions.

## 2019-06-27 NOTE — Progress Notes (Signed)
ANTICOAGULATION CONSULT NOTE - Follow Up Consult  Pharmacy Consult for heparin Indication: s/p cerebral angiogram  Labs: Recent Labs    06/26/19 0635 06/26/19 1816 06/26/19 1820 06/27/19 0252  HGB 14.8 13.9  --  12.8*  HCT 44.7 41.0  --  38.3*  PLT 96*  --   --  103*  LABPROT 13.8  --   --   --   INR 1.1  --   --   --   HEPARINUNFRC  --   --  <0.10* <0.10*  CREATININE 0.99  --   --  0.71    Assessment: 28yo male subtherapeutic on heparin after rate increase; no gtt issues or signs of bleeding per RN.  Goal of Therapy:  Heparin level 0.1-0.25 units/ml   Plan:  Will increase heparin gtt by 2 units/kg/hr to 800 units/hr until off this am.    Wynona Neat, PharmD, BCPS  06/27/2019,5:16 AM

## 2019-06-27 NOTE — CV Procedure (Signed)
2D echo attempted but patient sitting up in bed and trying to get arm restraints off. Will try echo later when patient can lie still.

## 2019-06-27 NOTE — Progress Notes (Signed)
Initial Nutrition Assessment  DOCUMENTATION CODES:   Not applicable  INTERVENTION:   If pt remains intubated recommend  Vital AF 1.2 @ 50 ml/hr (1200 ml/day) 30 ml Prostat daily  Provides: 1540 kcal, 105 grams protein, and 973 ml free water.  TF regimen and propofol at current rate providing 2041 total kcal/day    NUTRITION DIAGNOSIS:   Inadequate oral intake related to inability to eat as evidenced by NPO status.  GOAL:   Patient will meet greater than or equal to 90% of their needs  MONITOR:   Vent status, I & O's  REASON FOR ASSESSMENT:   Ventilator    ASSESSMENT:   Pt with PMH of depression, prior suicidal ideations, GERD and known basilar aneurysm followed by IR dx 03/2019 now admitted for embolization 12/23. Pt developed vision changes in PACU and agitation and was sedated and intubated for MRI/CT which reveals SAH.   Pt with hypotension overnight, now off pressors  Patient is currently intubated on ventilator support MV: 9.6 L/min Temp (24hrs), Avg:97.9 F (36.6 C), Min:97 F (36.1 C), Max:99.4 F (37.4 C)  Propofol: 19 ml/hr provides: 501 kcal  Medications and labs reviewed    NUTRITION - FOCUSED PHYSICAL EXAM:  Deferred   Diet Order:   Diet Order    None      EDUCATION NEEDS:   No education needs have been identified at this time  Skin:  Skin Assessment: Reviewed RN Assessment  Last BM:  unknown  Height:   Ht Readings from Last 1 Encounters:  06/26/19 5\' 11"  (1.803 m)    Weight:   Wt Readings from Last 1 Encounters:  06/26/19 73.9 kg    Ideal Body Weight:  78.1 kg  BMI:  Body mass index is 22.73 kg/m.  Estimated Nutritional Needs:   Kcal:  1985  Protein:  95-115 grams  Fluid:  2L/day  [place], [place], [place] Pager [phone] After Hours Pager

## 2019-06-27 NOTE — Consult Note (Signed)
NAME:  Jacob Holder, MRN:  [id number], DOB:  [birth year], LOS: 1 ADMISSION DATE:  06/26/2019, CONSULTATION DATE:  12/23 REFERRING MD:  Dr. 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  Patient began displaying signs of altered mental status with complaints of vision loss that progressively worsened to severe agitation and combativeness.  PCCM consulted for further management.  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 Per IR team has been having difficulty getting procedure scheduled due to transportation issues and medication compliance.  Patient has been experiencing headaches with intermittent nausea prior to admission.  Denies any vision changes or other neurological symptoms.  Patient successfully underwent angiogram embolization of basilar aneurysm with Dr. Corliss Skains 12/23 without any acute complications.  However on repeat evaluation in PACU patient began experiencing vision changes stating "I cannot see" it appears that around the same time patient became confused with mild agitation.  Agitation quickly worsened and patient became significantly combative.  Neurology consulted and recommended obtaining MRI of the brain but due to combativeness unable to obtain.  Therefore decision was made to administer deep sedation to ensure compliance with imaging and  patient was intubated for airway protection prior to sedation.  PCCM consulted for further management  Past Medical History  Depression Prior suicidal ideations Basal artery aneurysm embolization GERD Headache  Significant Hospital Events   12/23 admitted for elective basilar artery aneurysm embolization  Consults:   Neurology PCCM  Procedures:  12/23 basilar artery aneurysm embolization 12/23 intubated  12/24 extubated  Significant Diagnostic Tests:  CT angio head and neck 12/23 >  1. Arterial stent from the mid basilar artery into the left P2 segment. 2. Decreased caliber of the left P2 segment just beyond the stent is likely exaggerated by artifact. 3. No distal branch vessel occlusion to explain visual loss. 4. No occlusive disease involving the right PCA branch vessels. 5. No acute infarct is evident. 6. Normal CTA of the neck.  MRI 06/27/19> 1. Basilar cistern subarachnoid hemorrhage and small volume intraventricular hemorrhage appears new from the CTA yesterday. Difficult to exclude a component of subdural hematoma at the foramen magnum. 2. No ventriculomegaly or significant intracranial mass effect at this time. 3. 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 No brainstem or anterior circulation involvement identified. No intra-axial hemorrhage identified. 4. Major intracranial vascular flow voids appear stable.  Micro Data:  MRSA PCR 12/23 > Negative   Antimicrobials:   Ancef periop x1  Interim history/subjective:  Awake, alert. Started on Precedex for agitation. After discussion with Neuro, plan for extubation  Objective   Blood pressure 104/74, pulse 67, temperature (!) 97 F (36.1 C), temperature source Axillary, resp. rate 16, height 5\' 11"  (1.803 m), weight 73.9 kg, SpO2 94 %.    Vent Mode: PRVC FiO2 (%):  [30 %-100 %] 30 % Set Rate:  [16 bmp] 16 bmp Vt Set:  [600 mL] 600 mL PEEP:  [5 cmH20] 5 cmH20 Plateau Pressure:  [16 cmH20-22 cmH20] 22 cmH20   Intake/Output Summary (Last 24 hours) at 06/27/2019 1143 Last data filed at 06/27/2019 0700 Gross per 24 hour  Intake 2507.78 ml  Output 2355 ml  Net 152.78  ml   Filed Weights   06/26/19 0725  Weight: 73.9 kg   Physical Exam: General: Well-appearing,  severely agitated and combative  HENT: [place], AT, OP clear, MMM Eyes: EOMI, no scleral icterus Respiratory: Clear to auscultation bilaterally.  No crackles, wheezing or rales Cardiovascular: RRR, -M/R/G, no JVD GI: BS+, soft, nontender Extremities:-Edema,-tenderness Neuro: Awake, alert, does not follow commands, moves extremities x 4 Skin: Intact, no rashes or bruising Psych: Normal mood, normal affect  Resolved Hospital Problem list     Assessment & Plan:  28 year old male admitted for embolization of known basilar artery aneurysm. Post-op patient developed confusion, bilateral blindness and aphasia. Due to his agitation he was intubated for airway protection to obtain urgent MRI which demonstrated SAH and scattered embolic infarcts.  Acute hypoxemic respiratory failure - due to inability to protect airway for procedures Plan -Extubated today to [place] -Monitor respiratory status in ICU  Agitation requiring sedation Hx polysubstance abuse +cocaine this admission Plan -Wean precedex as able -CIWA in non-intubated patient  Acute encephalopathy secondary to [facility], embolic infarcts, polysubstance withdrawal +/- delirium Plan -Neuro and NSG following. Post-op care and anticoagulation per consult teams. -Speech/PT/OT per Neuro -Neuro checks -Goal systolic blood pressure [phone]   Best practice:  Diet: NPO Pain/Anxiety/Delirium protocol (if indicated): As needed fentanyl and propofol VAP protocol (if indicated): Yes DVT prophylaxis: SCDs, heparin drip per neuro-pharmacy consult GI prophylaxis: PPI Glucose control: History of diabetes.  Follow with BMP, hypoglycemia protocol Mobility: Bedrest Code Status: Full Family Communication: Updated by primary Disposition: Remain in ICU  Labs   CBC: Recent Labs  Lab 06/26/19 0635 06/26/19 1816 06/27/19 0252  WBC 4.1  --  9.7  NEUTROABS 1.7  --   --   HGB 14.8 13.9 12.8*  HCT 44.7 41.0 38.3*  MCV 86.5  --  84.4  PLT 96*  --  103*     Basic Metabolic Panel: Recent Labs  Lab 06/26/19 0635 06/26/19 1816 06/27/19 0252  NA 137 140 137  K 3.7 3.8 3.9  CL 103  --  104  CO2 26  --  23  GLUCOSE 88  --  96  BUN 7  --  5*  CREATININE 0.99  --  0.71  CALCIUM 8.8*  --  8.6*  MG  --   --  1.9  PHOS  --   --  2.6   GFR: Estimated Creatinine Clearance: 141.1 mL/min (by C-G formula based on SCr of 0.71 mg/dL). Recent Labs  Lab 06/26/19 0635 06/26/19 1820 06/27/19 0252  PROCALCITON  --  <0.10 0.12  WBC 4.1  --  9.7    Liver Function Tests: No results for input(s): AST, ALT, ALKPHOS, BILITOT, PROT, ALBUMIN in the last 168 hours. No results for input(s): LIPASE, AMYLASE in the last 168 hours. Recent Labs  Lab 06/26/19 1820  AMMONIA 34    ABG    Component Value Date/Time   PHART 7.488 (H) 06/26/2019 1816   PCO2ART 37.8 06/26/2019 1816   PO2ART 641.0 (H) 06/26/2019 1816   HCO3 28.7 (H) 06/26/2019 1816   TCO2 30 06/26/2019 1816   O2SAT 100.0 06/26/2019 1816     Coagulation Profile: Recent Labs  Lab 06/26/19 0635  INR 1.1    Cardiac Enzymes: No results for input(s): CKTOTAL, CKMB, CKMBINDEX, TROPONINI in the last 168 hours.  HbA1C: Hgb A1c MFr Bld  Date/Time Value Ref Range Status  06/26/2019 06:20 PM 5.5 4.8 - 5.6 % Final    Comment:    (  NOTE) Pre diabetes:          5.7%-6.4% Diabetes:              >6.4% Glycemic control for   <7.0% adults with diabetes     CBG: Recent Labs  Lab 06/26/19 1609  GLUCAP 108*    The patient is critically ill with multiple organ systems failure and requires high complexity decision making for assessment and support, frequent evaluation and titration of therapies, application of advanced monitoring technologies and extensive interpretation of multiple databases.   Critical Care Time devoted to patient care services described in this note is 42 Minutes.   Mechele CollinJane Fox Salminen, M.D. [facility] 06/27/2019 11:43 AM   Please see  Amion for pager number to reach on-call Pulmonary and Critical Care Team.

## 2019-06-27 NOTE — Progress Notes (Signed)
SBP still not within range, another 500 cc bolus ordered & given.  Awaiting further neo orders from CCM.

## 2019-06-27 NOTE — Progress Notes (Signed)
[facility] Progress Note Patient Name: Jacob Holder DOB: 1990-11-20 MRN: [id number]   Date of Service  06/27/2019  HPI/Events of Note  Per Dr. Cheral Marker of neurology Pt blood pressure goal is SBP. 120 mmHg which Pt is not meeting despite 2 500 ml iv fluid boluses.  eICU Interventions  Phenylephrine infusion ordered to keep  SBP 120-140 mmHg.        [name] 06/27/2019, 6:27 AM

## 2019-06-27 NOTE — Progress Notes (Signed)
Patient ID: Jacob Holder, male   DOB: [birth year], 28 y.o.   MRN: [id number] INR. Extubated. Subdued . Responds to simple commands appropriately. Knows he is in hospital though cannot name it. Knows mothers and grandmothers names. Cannot identify objects held in front of him.due to visual impairement. Moves all 4s equally.  Able to tolerate semi solids and liquids . RT groin soft. D/W CCM  Patient may be ready to be transferred from the NICU to regular floor.. Because of his medical and neurological impairment patient is going to need, rehab evaluation , PT/OT ,social services and possibly placement.  Discussed his case with Dr Kathreen Cosier  ,hospitalist for transfer of care,with INR and neurology consulting. Dr Zigmund Daniel  has agreed to have the patient transferred to hospitalist service  when CCM feels patient stable enough to do so. S.Jayvin Hurrell MD

## 2019-06-27 NOTE — Progress Notes (Signed)
PT transported to CT and back with no complications 

## 2019-06-28 ENCOUNTER — Inpatient Hospital Stay (HOSPITAL_COMMUNITY): Payer: Managed Care, Other (non HMO)

## 2019-06-28 DIAGNOSIS — Z9289 Personal history of other medical treatment: Secondary | ICD-10-CM

## 2019-06-28 DIAGNOSIS — R4701 Aphasia: Secondary | ICD-10-CM

## 2019-06-28 DIAGNOSIS — I340 Nonrheumatic mitral (valve) insufficiency: Secondary | ICD-10-CM

## 2019-06-28 LAB — BASIC METABOLIC PANEL
Anion gap: 14 (ref 5–15)
BUN: 7 mg/dL (ref 6–20)
CO2: 22 mmol/L (ref 22–32)
Calcium: 8.5 mg/dL — ABNORMAL LOW (ref 8.9–10.3)
Chloride: 104 mmol/L (ref 98–111)
Creatinine, Ser: 0.81 mg/dL (ref 0.61–1.24)
GFR calc Af Amer: >60 mL/min (ref >60)
GFR calc non Af Amer: >60 mL/min (ref >60)
Glucose, Bld: 86 mg/dL (ref 70–99)
Potassium: 3.6 mmol/L (ref 3.5–5.1)
Sodium: 140 mmol/L (ref 135–145)

## 2019-06-28 LAB — ECHOCARDIOGRAM COMPLETE
Height: 71 in
Weight: 2607.99 oz

## 2019-06-28 LAB — CBC
HCT: 40.8 % (ref 39.0–52.0)
Hemoglobin: 13.4 g/dL (ref 13.0–17.0)
MCH: 27.9 pg (ref 26.0–34.0)
MCHC: 32.8 g/dL (ref 30.0–36.0)
MCV: 84.8 fL (ref 80.0–100.0)
Platelets: 88 10*3/uL — ABNORMAL LOW (ref 150–400)
RBC: 4.81 MIL/uL (ref 4.22–5.81)
RDW: 12.2 % (ref 11.5–15.5)
WBC: 5.5 10*3/uL (ref 4.0–10.5)
nRBC: 0 % (ref 0.0–0.2)

## 2019-06-28 LAB — LIPID PANEL
Cholesterol: 133 mg/dL (ref 0–200)
HDL: 24 mg/dL — ABNORMAL LOW (ref >40)
LDL Cholesterol: 73 mg/dL (ref 0–99)
Total CHOL/HDL Ratio: 5.5 RATIO
Triglycerides: 178 mg/dL — ABNORMAL HIGH (ref <150)
VLDL: 36 mg/dL (ref 0–40)

## 2019-06-28 MED ORDER — ONDANSETRON HCL 4 MG/2ML IJ SOLN
4.0000 mg | Freq: Four times a day (QID) | INTRAMUSCULAR | Status: DC | PRN
  Administered 2019-06-28: 10:00:00 4 mg via INTRAVENOUS

## 2019-06-28 MED ORDER — SODIUM CHLORIDE 0.9 % IV SOLN: INTRAVENOUS | Status: DC

## 2019-06-28 MED ORDER — ONDANSETRON HCL 4 MG/2ML IJ SOLN
4.0000 mg | Freq: Four times a day (QID) | INTRAMUSCULAR | Status: AC
  Administered 2019-06-28 – 2019-06-29 (×4): 4 mg via INTRAVENOUS
  Filled 2019-06-28 (×3): qty 2

## 2019-06-28 MED ORDER — CLOPIDOGREL BISULFATE 75 MG PO TABS
75.0000 mg | ORAL_TABLET | Freq: Once | ORAL | Status: AC
  Administered 2019-06-28: 11:00:00 75 mg via ORAL
  Filled 2019-06-28: qty 1

## 2019-06-28 MED ORDER — ACETAMINOPHEN 650 MG RE SUPP: 650.0000 mg | RECTAL | Status: DC | PRN

## 2019-06-28 MED ORDER — ONDANSETRON HCL 4 MG/2ML IJ SOLN
4.0000 mg | Freq: Four times a day (QID) | INTRAMUSCULAR | Status: DC | PRN
  Filled 2019-06-28: qty 2

## 2019-06-28 MED ORDER — ACETAMINOPHEN 325 MG PO TABS
650.0000 mg | ORAL_TABLET | ORAL | Status: DC | PRN
  Administered 2019-06-30 (×2): 650 mg via ORAL
  Filled 2019-06-28 (×3): qty 2

## 2019-06-28 MED ORDER — ONDANSETRON HCL 4 MG/2ML IJ SOLN
4.0000 mg | Freq: Three times a day (TID) | INTRAMUSCULAR | Status: DC | PRN
  Administered 2019-06-28: 04:00:00 4 mg via INTRAVENOUS
  Filled 2019-06-28: qty 2

## 2019-06-28 MED ORDER — CLOPIDOGREL BISULFATE 75 MG PO TABS: 75.0000 mg | ORAL_TABLET | Freq: Every day | ORAL | Status: DC

## 2019-06-28 MED ORDER — NICOTINE 21 MG/24HR TD PT24
21.0000 mg | MEDICATED_PATCH | Freq: Every day | TRANSDERMAL | Status: DC
  Administered 2019-06-29 – 2019-06-30 (×2): 21 mg via TRANSDERMAL
  Filled 2019-06-28 (×2): qty 1

## 2019-06-28 MED ORDER — ACETAMINOPHEN 160 MG/5ML PO SOLN: 650.0000 mg | ORAL | Status: DC | PRN

## 2019-06-28 MED ORDER — ASPIRIN 325 MG PO TABS: 325.0000 mg | ORAL_TABLET | Freq: Every day | ORAL | Status: DC

## 2019-06-28 MED ORDER — ONDANSETRON HCL 4 MG/2ML IJ SOLN
4.0000 mg | Freq: Three times a day (TID) | INTRAMUSCULAR | Status: DC
  Filled 2019-06-28: qty 2

## 2019-06-28 MED ORDER — ASPIRIN 81 MG PO CHEW: 324.0000 mg | CHEWABLE_TABLET | Freq: Every day | ORAL | Status: DC

## 2019-06-28 MED ORDER — ONDANSETRON HCL 4 MG/2ML IJ SOLN: 4.0000 mg | Freq: Four times a day (QID) | INTRAMUSCULAR | Status: DC | PRN

## 2019-06-28 MED ORDER — FENTANYL CITRATE (PF) 100 MCG/2ML IJ SOLN
12.5000 ug | Freq: Three times a day (TID) | INTRAMUSCULAR | Status: DC | PRN
  Administered 2019-06-29 – 2019-06-30 (×4): 25 ug via INTRAVENOUS
  Filled 2019-06-28 (×4): qty 2

## 2019-06-28 MED ORDER — PROCHLORPERAZINE EDISYLATE 10 MG/2ML IJ SOLN
10.0000 mg | Freq: Four times a day (QID) | INTRAMUSCULAR | Status: AC | PRN
  Administered 2019-06-28 – 2019-06-29 (×3): 10 mg via INTRAVENOUS
  Filled 2019-06-28 (×3): qty 2

## 2019-06-28 MED ORDER — ACETAMINOPHEN 325 MG PO TABS
650.0000 mg | ORAL_TABLET | Freq: Four times a day (QID) | ORAL | Status: DC | PRN
  Administered 2019-06-28 (×2): 650 mg via ORAL
  Filled 2019-06-28 (×2): qty 2

## 2019-06-28 NOTE — Progress Notes (Signed)
  Echocardiogram 2D Echocardiogram was attempted but patient was in CT, we will try again later.   Jennette Dubin 06/28/2019, 1:56 PM

## 2019-06-28 NOTE — Progress Notes (Signed)
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  Postprocedure he is a started experiencing vision changes and blindness with confusion and agitation.  Neurology was consulted recommended MRI of the brain patient was combative.  He was put on deep sedation to get MRI and was intubated for airway protection.  Intubated 06/26/2019 extubated 06/27/2019.  He was weaned off Precedex 06/28/2019.  He was seen in consultation by neurology and PCCM. He has a history of depression, suicidal ideations, GERD and headaches. MRI 06/27/2019 basilar cistern subarachnoid hemorrhage and small volume intraventricular hemorrhage scattered acute infarcts in the bilateral parietal, left occipital lobe and right cerebellum. 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  PT OT and speech eval. Neuro recommends aspirin 81 mg daily with Plavix 75 mg daily for intracranial stent. SCDs for VT prophylaxis. His LDL is 73. Hemoglobin A1c is 5.5. Follow-up echo. Patient was intubated for airway protection with extreme agitation intubated 06/26/2019 extubated 06/27/2019.   #2 polysubstance abuse with urine drug screen positive for cocaine patient  weaned off Precedex today.  #3 hypertension on verapamil 80 mg 3 times a day at home.  Here the blood pressure has been soft and was given IV fluids.  Hold verapamil blood pressure 108/81.  Continue normal saline.  #4 tobacco abuse counseled to quit smoking  #5 alcohol abuse counseled to quit drinking  #6 substance abuse urine drug screen positive for cocaine counseled against use of polysubstance abuse.  #7 dysphagia followed by speech therapy.  #8 thrombocytopenia platelets trending down 88 today from 103 monitor closely on aspirin and Plavix status post heparin.     Nutrition Problem: Inadequate oral intake Etiology: inability to eat     Signs/Symptoms: NPO status    Interventions: Refer to RD note for recommendations  Estimated body mass index is 22.73 kg/m as calculated from the following:   Height as of this encounter: _0  (1.803 m).   Weight as of this encounter: 73.9 kg.  DVT prophylaxis: SCD Code Status: Full code Family Communication: None  disposition Plan: Pending clinical improvement Consultants: pccm neurology, neurosurgery   Procedures: Antimicrobials none  subjective: Patient is awake as soon as I walked into the room he said he is going home someone said he can go home and that he lives with his brother.   Objective: Vitals:   06/28/19 0900 06/28/19 1000 06/28/19 1100 06/28/19 1200  BP: 125/73 120/86 117/83 108/81  Pulse: 74 66 76 79  Resp: (!) 23  (!) 27 (!) 31  Temp:      TempSrc:      SpO2: 90% 91% 92% 93%  Weight:      Height:        Intake/Output Summary (Last 24 hours) at 06/28/2019 1345 Last data filed at 06/28/2019 1200 Gross per 24 hour  Intake  1049.14 ml  Output 1027 ml  Net 22.14 ml   Filed Weights   06/26/19 0725  Weight: 73.9 kg    Examination: Wants foley  catheter out draining clear urine General exam: Appears calm and comfortable  Respiratory system: Clear to auscultation. Respiratory effort  normal. Cardiovascular system: S1 & S2 heard, RRR. No JVD, murmurs, rubs, gallops or clicks. No pedal edema. Gastrointestinal system: Abdomen is nondistended, soft and nontender. No organomegaly or masses felt. Normal bowel sounds heard. Central nervous system: Awake moves all his extremities oriented to hospital  extremities: Symmetric 5 x 5 power. Skin: No rashes, lesions or ulcers Psychiatry: Judgement and insight appear normal. Mood & affect appropriate.     Data Reviewed: I have personally reviewed following labs and imaging studies  CBC: Recent Labs  Lab 06/26/19 0635 06/26/19 1816 06/27/19 0252 06/28/19 0501  WBC 4.1  --  9.7 5.5  NEUTROABS 1.7  --   --   --   HGB 14.8 13.9 12.8* 13.4  HCT 44.7 41.0 38.3* 40.8  MCV 86.5  --  84.4 84.8  PLT 96*  --  103* 88*   Basic Metabolic Panel: Recent Labs  Lab 06/26/19 0635 06/26/19 1816 06/27/19 0252 06/28/19 0501  NA 137 140 137 140  K 3.7 3.8 3.9 3.6  CL 103  --  104 104  CO2 26  --  23 22  GLUCOSE 88  --  96 86  BUN 7  --  5* 7  CREATININE 0.99  --  0.71 0.81  CALCIUM 8.8*  --  8.6* 8.5*  MG  --   --  1.9  --   PHOS  --   --  2.6  --    GFR: Estimated Creatinine Clearance: 139.4 mL/min (by C-G formula based on SCr of 0.81 mg/dL). Liver Function Tests: No results for input(s): AST, ALT, ALKPHOS, BILITOT, PROT, ALBUMIN in the last 168 hours. No results for input(s): LIPASE, AMYLASE in the last 168 hours. Recent Labs  Lab 06/26/19 1820  AMMONIA 34   Coagulation Profile: Recent Labs  Lab 06/26/19 0635  INR 1.1   Cardiac Enzymes: No results for input(s): CKTOTAL, CKMB, CKMBINDEX, TROPONINI in the last 168 hours. BNP (last 3 results) No results for input(s): PROBNP in the last 8760 hours. HbA1C: Recent Labs    06/26/19 1820  HGBA1C 5.5   CBG: Recent Labs  Lab 06/26/19 1609  GLUCAP 108*   Lipid Profile: Recent Labs    06/27/19 0252 06/28/19 0501  CHOL  --  133  HDL  --  24*  LDLCALC  --  73   TRIG 223* 178*  CHOLHDL  --  5.5   Thyroid Function Tests: No results for input(s): TSH, T4TOTAL, FREET4, T3FREE, THYROIDAB in the last 72 hours. Anemia Panel: No results for input(s): VITAMINB12, FOLATE, FERRITIN, TIBC, IRON, RETICCTPCT in the last 72 hours. Sepsis Labs: Recent Labs  Lab 06/26/19 1820 06/27/19 0252  PROCALCITON <0.10 0.12    Recent Results (from the past 240 hour(s))  Novel Coronavirus, NAA (Hosp order, Send-out to Ref Lab; TAT 18-24 hrs     Status: None   Collection Time: 06/22/19  4:52 PM   Specimen: Nasopharyngeal Swab; Respiratory  Result Value Ref Range Status   SARS-CoV-2, NAA NOT DETECTED NOT DETECTED Final    Comment: (NOTE) This nucleic acid amplification test was developed and its performance characteristics determined by [employer]. Nucleic acid amplification tests include PCR and TMA. This test has not  been FDA cleared or approved. This test has been authorized by FDA under an Emergency Use Authorization (EUA). This test is only authorized for the duration of time the declaration that circumstances exist justifying the authorization of the emergency use of in vitro diagnostic tests for detection of SARS-CoV-2 virus and/or diagnosis of COVID-19 infection under section 564(b)(1) of the Act, 21 U.S.C. 376EGB-1(D) (1), unless the authorization is terminated or revoked sooner. When diagnostic testing is negative, the possibility of a false negative result should be considered in the context of a patient's recent exposures and the presence of clinical signs and symptoms consistent with COVID-19. An individual without symptoms of COVID- 19 and who is not shedding SARS-CoV-2 vi rus would expect to have a negative (not detected) result in this assay. Performed At: [facility] [address] [place], [place] [id number] Rush Farmer MD TG:[id number]    [place]  Final    Comment: Performed at [facility], [place] [address], [place], [place] [address]  MRSA PCR Screening     Status: None   Collection Time: 06/26/19  6:20 PM   Specimen: Nasal Mucosa; Nasopharyngeal  Result Value Ref Range Status   MRSA by PCR NEGATIVE NEGATIVE Final    Comment:        The GeneXpert MRSA Assay (FDA approved for NASAL specimens only), is one component of a comprehensive MRSA colonization surveillance program. It is not intended to diagnose MRSA infection nor to guide or monitor treatment for MRSA infections. Performed at [facility], [place] [address], [place], [place] [address]          Radiology Studies: EEG  Result Date: 06/26/2019 Lora Havens, MD     06/26/2019  4:45 PM Patient Name: Jakoby Melendrez MRN: [id number] Epilepsy Attending: Lora Havens Referring Physician/Provider: Etta Quill, PA Date: 06/26/2019 Duration: 22.25 mins Patient history: 28 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  EEG to evaluate for seizures. Level of alertness: Awake AEDs during EEG study: None Technical aspects: This EEG study was done with scalp electrodes positioned according to the 10-20 International system of electrode placement. Electrical activity was acquired at a sampling rate of _0  and reviewed with a high frequency filter of _1  and a low frequency filter of _2 . EEG data were recorded continuously and digitally stored. Description: EEG was technically difficult due to significant myogenic artifact. No clear posterior dominant rhythm was seen. EEG showed continuous generalized 3-5 hz theta-delta slowing. Hyperventilation and photic stimulation were not performed. Abnormality - Continuous slow, generalized IMPRESSION: This technically difficult study is suggestive of moderate diffuse encephalopathy, non specific to etiology. No definite seizures or epileptiform discharges were seen throughout the recording. Priyanka Barbra Sarks    CT ANGIO HEAD W OR WO CONTRAST  Result Date: 06/26/2019 CLINICAL DATA:  Loss of vision after neuro interventional procedure. Placement neuroform atlas stent across a wide necked basilar tip aneurysm. EXAM: CT ANGIOGRAPHY HEAD AND NECK TECHNIQUE: Multidetector CT imaging of the head and neck was performed using the standard protocol during bolus administration of intravenous contrast. Multiplanar CT image reconstructions and MIPs were obtained to evaluate the vascular anatomy. Carotid stenosis measurements (when applicable) are obtained utilizing NASCET criteria, using the distal internal carotid diameter as the denominator. CONTRAST:  62m OMNIPAQUE IOHEXOL 350 MG/ML SOLN COMPARISON:  None. FINDINGS: CTA NECK FINDINGS Aortic arch: A 3 vessel arch configuration is present. No significant atherosclerotic changes are present.  No aneurysm or stenosis. Right carotid system: The right common carotid artery is within normal limits. Bifurcation is unremarkable. Cervical right ICA is normal. Left carotid system: The left common carotid artery is within normal limits. Bifurcation is unremarkable. The cervical left ICA is normal. Vertebral arteries: The vertebral arteries are codominant. There is no significant stenosis in either vertebral artery in the neck. Vertebral arteries originate the subclavian arteries without significant stenosis. Skeleton: Vertebral body heights alignment are maintained. Focal lytic or blastic lesions are present. Other neck: Soft tissues the neck are otherwise unremarkable. Upper chest: Lung apices are clear.  Thoracic inlet is normal. Review of the MIP images confirms the above findings CTA HEAD FINDINGS Anterior circulation: The internal carotid arteries are within normal limits through the ICA termini bilaterally. 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 Both A2 segments fill from right. M1 segments are normal bilaterally. MCA bifurcations are normal. ACA and MCA branch vessels are within normal  limits. No significant proximal stenosis or occlusion is present. Posterior circulation: The vertebral arteries are codominant. PICA origins are visualized and normal. Vertebrobasilar junction is normal. Stent is visualized from the distal basilar artery into the left P2 segment. There is decreased caliber of the vessel just distal to the stent. The appearance is likely exaggerated due to artifact. Distal branch vessels fill in the left PCA branches. Right PCA branches are within normal limits. Venous sinuses: The dural sinuses are patent. The straight sinus and deep cerebral veins are intact. Cortical veins are within normal limits. No vascular malformation is evident. Anatomic variants: None Review of the MIP images confirms the above findings IMPRESSION: 1. Arterial stent from the mid basilar artery into the left P2 segment. 2. Decreased caliber of the left P2 segment just beyond the stent is likely exaggerated by artifact. 3. No distal branch vessel occlusion to explain visual loss. 4. No occlusive disease involving the right PCA branch vessels. 5. No acute infarct is evident. 6. Normal CTA of the neck. These results were called by telephone at the time of interpretation on 06/26/2019 at 2:26 pm to provider [facility], who verbally acknowledged these results. Electronically Signed   By: San Morelle M.D.   On: 06/26/2019 14:40   CT HEAD WO CONTRAST  Result Date: 06/27/2019 CLINICAL DATA:  28 year old male status post basilar tip aneurysm stenting with loss of vision. Follow-up MRI reveals evidence of basilar subarachnoid hemorrhage and IVH. EXAM: CT HEAD WITHOUT CONTRAST TECHNIQUE: Contiguous axial images were obtained from the base of the skull through the vertex without intravenous contrast. COMPARISON:  Brain MRI 0335 hours today and earlier. FINDINGS: Brain: Small volume intraventricular hemorrhage in the occipital horns and 4th ventricle, also visible by MRI. Basilar cistern subarachnoid  hemorrhage primarily on the left: Interpeduncular, ambient cistern, prepontine, left CP angle, and pre medullary. The CT appearance is less convincing for subdural hematoma at the cisterna magna. Stable ventricle size, no ventriculomegaly. Patchy somewhat subtle hypodensity in the posterior parietal lobes, left occipital lobe and right cerebellum corresponding to the acute infarcts by MRI. No parenchymal hemorrhage. No significant intracranial mass effect at this time. Gray-white matter differentiation elsewhere remains within normal limits. Vascular: Basilar artery through left PCA vascular stent is evident. Skull: No acute osseous abnormality identified. Sinuses/Orbits: Visualized paranasal sinuses and mastoids are stable and well pneumatized. Other: Intubated. Fluid in the pharynx. No acute orbit or scalp soft tissue finding. IMPRESSION: 1. Basilar cistern subarachnoid hemorrhage is primarily on the left and stable from the MRI earlier  today. No superimposed subdural hematoma suspected. No intra-axial blood identified. 2. Trace associated IVH. No ventriculomegaly or significant intracranial mass effect at this time. 3. Patchy hypodensity associated with the acute parietal, left occipital, and right cerebellar infarcts. Electronically Signed   By: Genevie Ann M.D.   On: 06/27/2019 07:21   CT ANGIO NECK W OR WO CONTRAST  Result Date: 06/26/2019 CLINICAL DATA:  Loss of vision after neuro interventional procedure. Placement neuroform atlas stent across a wide necked basilar tip aneurysm. EXAM: CT ANGIOGRAPHY HEAD AND NECK TECHNIQUE: Multidetector CT imaging of the head and neck was performed using the standard protocol during bolus administration of intravenous contrast. Multiplanar CT image reconstructions and MIPs were obtained to evaluate the vascular anatomy. Carotid stenosis measurements (when applicable) are obtained utilizing NASCET criteria, using the distal internal carotid diameter as the denominator.  CONTRAST:  65m OMNIPAQUE IOHEXOL 350 MG/ML SOLN COMPARISON:  None. FINDINGS: CTA NECK FINDINGS Aortic arch: A 3 vessel arch configuration is present. No significant atherosclerotic changes are present. No aneurysm or stenosis. Right carotid system: The right common carotid artery is within normal limits. Bifurcation is unremarkable. Cervical right ICA is normal. Left carotid system: The left common carotid artery is within normal limits. Bifurcation is unremarkable. The cervical left ICA is normal. Vertebral arteries: The vertebral arteries are codominant. There is no significant stenosis in either vertebral artery in the neck. Vertebral arteries originate the subclavian arteries without significant stenosis. Skeleton: Vertebral body heights alignment are maintained. Focal lytic or blastic lesions are present. Other neck: Soft tissues the neck are otherwise unremarkable. Upper chest: Lung apices are clear.  Thoracic inlet is normal. Review of the MIP images confirms the above findings CTA HEAD FINDINGS Anterior circulation: The internal carotid arteries are within normal limits through the ICA termini bilaterally. 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 Both A2 segments fill from right. M1 segments are normal bilaterally. MCA bifurcations are normal. ACA and MCA branch vessels are within normal limits. No significant proximal stenosis or occlusion is present. Posterior circulation: The vertebral arteries are codominant. PICA origins are visualized and normal. Vertebrobasilar junction is normal. Stent is visualized from the distal basilar artery into the left P2 segment. There is decreased caliber of the vessel just distal to the stent. The appearance is likely exaggerated due to artifact. Distal branch vessels fill in the left PCA branches. Right PCA branches are within normal limits. Venous sinuses: The dural sinuses are patent. The straight sinus and deep cerebral veins are intact. Cortical veins are within normal  limits. No vascular malformation is evident. Anatomic variants: None Review of the MIP images confirms the above findings IMPRESSION: 1. Arterial stent from the mid basilar artery into the left P2 segment. 2. Decreased caliber of the left P2 segment just beyond the stent is likely exaggerated by artifact. 3. No distal branch vessel occlusion to explain visual loss. 4. No occlusive disease involving the right PCA branch vessels. 5. No acute infarct is evident. 6. Normal CTA of the neck. These results were called by telephone at the time of interpretation on 06/26/2019 at 2:26 pm [facility] verbally acknowledged these results. Electronically Signed   By: CSan MorelleM.D.   On: 06/26/2019 14:40   MR BRAIN WO CONTRAST  Addendum Date: 06/27/2019   ADDENDUM REPORT: 06/27/2019 06:27 ADDENDUM: Study discussed by telephone with Dr. EKerney Elbeon 06/27/2019 at 0616 hours. Electronically Signed   By: HGenevie AnnM.D.   On: 06/27/2019 06:27  Result Date: 06/27/2019 CLINICAL DATA:  29 year old male with loss of vision after NIR procedure, stent placed across basilar tip aneurysm. EXAM: MRI HEAD WITHOUT CONTRAST TECHNIQUE: Multiplanar, multiecho pulse sequences of the brain and surrounding structures were obtained without intravenous contrast. COMPARISON:  CTA head and neck yesterday.  Brain MRI 02/22/2019. FINDINGS: Brain: There is new high FLAIR signal and intermediate T1 signal in the basilar cisterns and posterior fossa suspicious for subarachnoid hemorrhage, including in the cisterna magna. Component of subdural blood is felt less likely. And there is associated intraventricular hemorrhage layering in the occipital horns, also the 4th ventricle. No superimposed ventriculomegaly. No midline shift or significant intracranial mass effect. Patchy areas of cortical and white matter restricted diffusion in both posterior parietal lobes, superior occipital lobes, the left occipital pole, left  lateral occipital lobe, and a small area of the anterior and medial occipital lobe. Superimposed similar patchy restricted diffusion in the right cerebellum including the right tonsil. Cytotoxic edema with no parenchymal blood identified. No definite superimposed brainstem restricted diffusion. No thalamic restricted diffusion. No anterior circulation restricted diffusion identified. Stable gray and white matter signal in the anterior circulation. Vascular: Major intracranial vascular flow voids appear stable compared to August. Basilar through left PCA stent better demonstrated by CTA. Skull and upper cervical spine: No definite hemorrhage in the visible upper cervical spine. Bone marrow signal remains normal. Sinuses/Orbits: Intubated. Fluid in the pharynx. Paranasal sinuses remain well pneumatized. Negative orbits. Other: Mastoids remain clear. IMPRESSION: 1. Basilar cistern subarachnoid hemorrhage and small volume intraventricular hemorrhage appears new from the CTA yesterday. Difficult to exclude a component of subdural hematoma at the foramen magnum. 2. No ventriculomegaly or significant intracranial mass effect at this time. 3. 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 No brainstem or anterior circulation involvement identified. No intra-axial hemorrhage identified. 4. Major intracranial vascular flow voids appear stable. Dr. Cheral Marker with Neurology was paged via [place] regarding the findings this exam on 06/27/2019 at 0553 hours. Electronically Signed: By: Genevie Ann M.D. On: 06/27/2019 06:01   DG Chest Port 1 View  Result Date: 06/27/2019 CLINICAL DATA:  Endotracheal tube.  Recent aneurysm embolization. EXAM: PORTABLE CHEST 1 VIEW COMPARISON:  None. FINDINGS: Endotracheal tube has tip 7.8 cm above the carina. Lungs are adequately inflated without focal airspace consolidation or effusion. Cardiomediastinal silhouette is within normal. Remaining bones and  soft tissues are normal. IMPRESSION: No active disease. Endotracheal tube with tip 7.8 cm above the carina. Electronically Signed   By: Marin Olp M.D.   On: 06/27/2019 07:10        Scheduled Meds: . aspirin  325 mg Oral Daily   Or  . aspirin  324 mg Per Tube Daily  . aspirin  81 mg Oral Daily  . chlorhexidine gluconate (MEDLINE KIT)  15 mL Mouth Rinse BID  . Chlorhexidine Gluconate Cloth  6 each Topical Daily  . clopidogrel  75 mg Oral Daily  . clopidogrel  75 mg Oral Daily   Or  . clopidogrel  75 mg Per Tube Daily  . dexmedetomidine  1 mcg/kg Intravenous Once  . fentaNYL (SUBLIMAZE) injection  50 mcg Intravenous Once  . folic acid  1 mg Oral Daily  . mouth rinse  15 mL Mouth Rinse BID  . multivitamin with minerals  1 tablet Oral Daily  . ondansetron (ZOFRAN) IV  4 mg Intravenous Q6H  . pantoprazole (PROTONIX) IV  40 mg Intravenous Q24H  . thiamine  100 mg Oral Daily   Continuous Infusions: . sodium chloride 100 mL/hr at 06/28/19 0518  . sodium chloride    . clevidipine Stopped (06/27/19 0433)  . dexmedetomidine (PRECEDEX) IV infusion Stopped (06/28/19 0847)     LOS: 2 days     Georgette Shell, MD [facility]  If 7PM-7AM, please contact night-coverage [email or web address] Password [email or web address] 06/28/2019, 1:45 PM

## 2019-06-28 NOTE — Progress Notes (Signed)
  Echocardiogram 2D Echocardiogram has been performed.  Jennette Dubin 06/28/2019, 3:31 PM

## 2019-06-28 NOTE — Progress Notes (Signed)
STROKE TEAM PROGRESS NOTE   INTERVAL HISTORY Patient was extubated yesterday and has done well.  His mental status is improved.  His speech is also improved and is able to communicate quite well.  He still has significant vision loss but states he is able to see in the left hemifield and count fingers. Vitals:   06/28/19 0700 06/28/19 0800 06/28/19 0900 06/28/19 1000  BP: 140/90 128/76 125/73 120/86  Pulse: 65 69 74 66  Resp: (!) 32 (!) 37 (!) 23   Temp:      TempSrc:      SpO2: 96% 91% 90% 91%  Weight:      Height:        CBC:  Recent Labs  Lab 06/26/19 0635 06/27/19 0252 06/28/19 0501  WBC 4.1 9.7 5.5  NEUTROABS 1.7  --   --   HGB 14.8 12.8* 13.4  HCT 44.7 38.3* 40.8  MCV 86.5 84.4 84.8  PLT 96* 103* 88*    Basic Metabolic Panel:  Recent Labs  Lab 06/27/19 0252 06/28/19 0501  NA 137 140  K 3.9 3.6  CL 104 104  CO2 23 22  GLUCOSE 96 86  BUN 5* 7  CREATININE 0.71 0.81  CALCIUM 8.6* 8.5*  MG 1.9  --   PHOS 2.6  --    Lipid Panel:     Component Value Date/Time   CHOL 133 06/28/2019 0501   TRIG 178 (H) 06/28/2019 0501   HDL 24 (L) 06/28/2019 0501   CHOLHDL 5.5 06/28/2019 0501   VLDL 36 06/28/2019 0501   LDLCALC 73 06/28/2019 0501   HgbA1c:  Lab Results  Component Value Date   HGBA1C 5.5 06/26/2019   Urine Drug Screen:     Component Value Date/Time   LABOPIA NONE DETECTED 06/26/2019 1828   COCAINSCRNUR POSITIVE (A) 06/26/2019 1828   LABBENZ POSITIVE (A) 06/26/2019 1828   AMPHETMU NONE DETECTED 06/26/2019 1828   THCU NONE DETECTED 06/26/2019 1828   LABBARB NONE DETECTED 06/26/2019 1828    Alcohol Level     Component Value Date/Time   ETH <5 11/30/2016 1358    IMAGING EEG  Result Date: 06/26/2019 Charlsie Quest, MD     06/26/2019  4:45 PM Patient Name: Jacob Holder MRN: [id number] Epilepsy Attending: Charlsie Quest Referring Physician/Provider: Felicie Morn, PA Date: 06/26/2019 Duration: 22.25 mins Patient history: 28 year old male 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  EEG to evaluate for seizures. Level of alertness: Awake AEDs during EEG study: None Technical aspects: This EEG study was done with scalp electrodes positioned according to the 10-20 International system of electrode placement. Electrical activity was acquired at a sampling rate of  and reviewed with a high frequency filter of  and a low frequency filter of . EEG data were recorded continuously and digitally stored. Description: EEG was technically difficult due to significant myogenic artifact. No clear posterior dominant rhythm was seen. EEG showed continuous generalized 3-5 hz theta-delta slowing. Hyperventilation and photic stimulation were not performed. Abnormality - Continuous slow, generalized IMPRESSION: This technically difficult study is suggestive of moderate diffuse encephalopathy, non specific to etiology. No definite seizures or epileptiform discharges were seen throughout the recording. Priyanka Annabelle Harman   CT ANGIO HEAD W OR WO CONTRAST  Result Date: 06/26/2019 CLINICAL DATA:  Loss of vision after neuro interventional procedure. Placement neuroform atlas stent across a wide necked basilar tip aneurysm. EXAM: CT ANGIOGRAPHY HEAD AND NECK TECHNIQUE: Multidetector  CT imaging of the head and neck was performed using the standard protocol during bolus administration of intravenous contrast. Multiplanar CT image reconstructions and MIPs were obtained to evaluate the vascular anatomy. Carotid stenosis measurements (when applicable) are obtained utilizing NASCET criteria, using the distal internal carotid diameter as the denominator. CONTRAST:  75mL OMNIPAQUE IOHEXOL 350 MG/ML SOLN COMPARISON:  None. FINDINGS: CTA NECK FINDINGS Aortic arch: A 3 vessel arch configuration is present. No significant atherosclerotic changes are present. No aneurysm or stenosis. Right carotid system: The right common carotid  artery is within normal limits. Bifurcation is unremarkable. Cervical right ICA is normal. Left carotid system: The left common carotid artery is within normal limits. Bifurcation is unremarkable. The cervical left ICA is normal. Vertebral arteries: The vertebral arteries are codominant. There is no significant stenosis in either vertebral artery in the neck. Vertebral arteries originate the subclavian arteries without significant stenosis. Skeleton: Vertebral body heights alignment are maintained. Focal lytic or blastic lesions are present. Other neck: Soft tissues the neck are otherwise unremarkable. Upper chest: Lung apices are clear.  Thoracic inlet is normal. Review of the MIP images confirms the above findings CTA HEAD FINDINGS Anterior circulation: The internal carotid arteries are within normal limits through the ICA termini bilaterally. 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 Both A2 segments fill from right. M1 segments are normal bilaterally. MCA bifurcations are normal. ACA and MCA branch vessels are within normal limits. No significant proximal stenosis or occlusion is present. Posterior circulation: The vertebral arteries are codominant. PICA origins are visualized and normal. Vertebrobasilar junction is normal. Stent is visualized from the distal basilar artery into the left P2 segment. There is decreased caliber of the vessel just distal to the stent. The appearance is likely exaggerated due to artifact. Distal branch vessels fill in the left PCA branches. Right PCA branches are within normal limits. Venous sinuses: The dural sinuses are patent. The straight sinus and deep cerebral veins are intact. Cortical veins are within normal limits. No vascular malformation is evident. Anatomic variants: None Review of the MIP images confirms the above findings IMPRESSION: 1. Arterial stent from the mid basilar artery into the left P2 segment. 2. Decreased caliber of the left P2 segment just beyond the stent is  likely exaggerated by artifact. 3. No distal branch vessel occlusion to explain visual loss. 4. No occlusive disease involving the right PCA branch vessels. 5. No acute infarct is evident. 6. Normal CTA of the neck. These results were called by telephone at the time of interpretation on 06/26/2019 at 2:26 pm to provider [facility], who verbally acknowledged these results. Electronically Signed   By: Marin Roberts M.D.   On: 06/26/2019 14:40   CT HEAD WO CONTRAST  Result Date: 06/27/2019 CLINICAL DATA:  28 year old male status post basilar tip aneurysm stenting with loss of vision. Follow-up MRI reveals evidence of basilar subarachnoid hemorrhage and IVH. EXAM: CT HEAD WITHOUT CONTRAST TECHNIQUE: Contiguous axial images were obtained from the base of the skull through the vertex without intravenous contrast. COMPARISON:  Brain MRI 0335 hours today and earlier. FINDINGS: Brain: Small volume intraventricular hemorrhage in the occipital horns and 4th ventricle, also visible by MRI. Basilar cistern subarachnoid hemorrhage primarily on the left: Interpeduncular, ambient cistern, prepontine, left CP angle, and pre medullary. The CT appearance is less convincing for subdural hematoma at the cisterna magna. Stable ventricle size, no ventriculomegaly. Patchy somewhat subtle hypodensity in the posterior parietal lobes, left occipital lobe and right cerebellum corresponding to the  acute infarcts by MRI. No parenchymal hemorrhage. No significant intracranial mass effect at this time. Gray-white matter differentiation elsewhere remains within normal limits. Vascular: Basilar artery through left PCA vascular stent is evident. Skull: No acute osseous abnormality identified. Sinuses/Orbits: Visualized paranasal sinuses and mastoids are stable and well pneumatized. Other: Intubated. Fluid in the pharynx. No acute orbit or scalp soft tissue finding. IMPRESSION: 1. Basilar cistern subarachnoid hemorrhage is primarily  on the left and stable from the MRI earlier today. No superimposed subdural hematoma suspected. No intra-axial blood identified. 2. Trace associated IVH. No ventriculomegaly or significant intracranial mass effect at this time. 3. Patchy hypodensity associated with the acute parietal, left occipital, and right cerebellar infarcts. Electronically Signed   By: Genevie Ann M.D.   On: 06/27/2019 07:21   CT ANGIO NECK W OR WO CONTRAST  Result Date: 06/26/2019 CLINICAL DATA:  Loss of vision after neuro interventional procedure. Placement neuroform atlas stent across a wide necked basilar tip aneurysm. EXAM: CT ANGIOGRAPHY HEAD AND NECK TECHNIQUE: Multidetector CT imaging of the head and neck was performed using the standard protocol during bolus administration of intravenous contrast. Multiplanar CT image reconstructions and MIPs were obtained to evaluate the vascular anatomy. Carotid stenosis measurements (when applicable) are obtained utilizing NASCET criteria, using the distal internal carotid diameter as the denominator. CONTRAST:  25mL OMNIPAQUE IOHEXOL 350 MG/ML SOLN COMPARISON:  None. FINDINGS: CTA NECK FINDINGS Aortic arch: A 3 vessel arch configuration is present. No significant atherosclerotic changes are present. No aneurysm or stenosis. Right carotid system: The right common carotid artery is within normal limits. Bifurcation is unremarkable. Cervical right ICA is normal. Left carotid system: The left common carotid artery is within normal limits. Bifurcation is unremarkable. The cervical left ICA is normal. Vertebral arteries: The vertebral arteries are codominant. There is no significant stenosis in either vertebral artery in the neck. Vertebral arteries originate the subclavian arteries without significant stenosis. Skeleton: Vertebral body heights alignment are maintained. Focal lytic or blastic lesions are present. Other neck: Soft tissues the neck are otherwise unremarkable. Upper chest: Lung apices are  clear.  Thoracic inlet is normal. Review of the MIP images confirms the above findings CTA HEAD FINDINGS Anterior circulation: The internal carotid arteries are within normal limits through the ICA termini bilaterally. 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 Both A2 segments fill from right. M1 segments are normal bilaterally. MCA bifurcations are normal. ACA and MCA branch vessels are within normal limits. No significant proximal stenosis or occlusion is present. Posterior circulation: The vertebral arteries are codominant. PICA origins are visualized and normal. Vertebrobasilar junction is normal. Stent is visualized from the distal basilar artery into the left P2 segment. There is decreased caliber of the vessel just distal to the stent. The appearance is likely exaggerated due to artifact. Distal branch vessels fill in the left PCA branches. Right PCA branches are within normal limits. Venous sinuses: The dural sinuses are patent. The straight sinus and deep cerebral veins are intact. Cortical veins are within normal limits. No vascular malformation is evident. Anatomic variants: None Review of the MIP images confirms the above findings IMPRESSION: 1. Arterial stent from the mid basilar artery into the left P2 segment. 2. Decreased caliber of the left P2 segment just beyond the stent is likely exaggerated by artifact. 3. No distal branch vessel occlusion to explain visual loss. 4. No occlusive disease involving the right PCA branch vessels. 5. No acute infarct is evident. 6. Normal CTA of the neck. These results  were called by telephone at the time of interpretation on 06/26/2019 at 2:26 pm to provider [facility], who verbally acknowledged these results. Electronically Signed   By: Marin Roberts M.D.   On: 06/26/2019 14:40   MR BRAIN WO CONTRAST  Addendum Date: 06/27/2019   ADDENDUM REPORT: 06/27/2019 06:27 ADDENDUM: Study discussed by telephone with Dr. Caryl Pina on 06/27/2019 at 0616 hours.  Electronically Signed   By: Odessa Fleming M.D.   On: 06/27/2019 06:27   Result Date: 06/27/2019 CLINICAL DATA:  28 year old male with loss of vision after NIR procedure, stent placed across basilar tip aneurysm. EXAM: MRI HEAD WITHOUT CONTRAST TECHNIQUE: Multiplanar, multiecho pulse sequences of the brain and surrounding structures were obtained without intravenous contrast. COMPARISON:  CTA head and neck yesterday.  Brain MRI 02/22/2019. FINDINGS: Brain: There is new high FLAIR signal and intermediate T1 signal in the basilar cisterns and posterior fossa suspicious for subarachnoid hemorrhage, including in the cisterna magna. Component of subdural blood is felt less likely. And there is associated intraventricular hemorrhage layering in the occipital horns, also the 4th ventricle. No superimposed ventriculomegaly. No midline shift or significant intracranial mass effect. Patchy areas of cortical and white matter restricted diffusion in both posterior parietal lobes, superior occipital lobes, the left occipital pole, left lateral occipital lobe, and a small area of the anterior and medial occipital lobe. Superimposed similar patchy restricted diffusion in the right cerebellum including the right tonsil. Cytotoxic edema with no parenchymal blood identified. No definite superimposed brainstem restricted diffusion. No thalamic restricted diffusion. No anterior circulation restricted diffusion identified. Stable gray and white matter signal in the anterior circulation. Vascular: Major intracranial vascular flow voids appear stable compared to August. Basilar through left PCA stent better demonstrated by CTA. Skull and upper cervical spine: No definite hemorrhage in the visible upper cervical spine. Bone marrow signal remains normal. Sinuses/Orbits: Intubated. Fluid in the pharynx. Paranasal sinuses remain well pneumatized. Negative orbits. Other: Mastoids remain clear. IMPRESSION: 1. Basilar cistern subarachnoid hemorrhage  and small volume intraventricular hemorrhage appears new from the CTA yesterday. Difficult to exclude a component of subdural hematoma at the foramen magnum. 2. No ventriculomegaly or significant intracranial mass effect at this time. 3. 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 No brainstem or anterior circulation involvement identified. No intra-axial hemorrhage identified. 4. Major intracranial vascular flow voids appear stable. Dr. Otelia Limes with Neurology was paged via AMION regarding the findings this exam on 06/27/2019 at 0553 hours. Electronically Signed: By: Odessa Fleming M.D. On: 06/27/2019 06:01   DG Chest Port 1 View  Result Date: 06/27/2019 CLINICAL DATA:  Endotracheal tube.  Recent aneurysm embolization. EXAM: PORTABLE CHEST 1 VIEW COMPARISON:  None. FINDINGS: Endotracheal tube has tip 7.8 cm above the carina. Lungs are adequately inflated without focal airspace consolidation or effusion. Cardiomediastinal silhouette is within normal. Remaining bones and soft tissues are normal. IMPRESSION: No active disease. Endotracheal tube with tip 7.8 cm above the carina. Electronically Signed   By: Elberta Fortis M.D.   On: 06/27/2019 07:10    PHYSICAL EXAM Young African-American male who is not in distress.  He is restless. . Afebrile. Head is nontraumatic. Neck is supple without bruit.    Cardiac exam no murmur or gallop. Lungs are clear to auscultation. Distal pulses are well felt. Neurological Exam ;  Patient is is restless in bed.  He is awake and responsive.  His speech is clear without any dysarthria.  He follows commands  well.  He does appear to be visually impaired but states he is able to see partially in the left hemifield but not on the right.  Pupils are equal reactive.  Fundi not visualized.  There is no facial weakness.  Tongue midline.  Is able to move all 4 extremities equally well against gravity without any focal  weakness. ASSESSMENT/PLAN Jacob Holder is a 28 y.o. 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 Arterial stent from the mid basilar artery into the left P2segment. 2. Decreased caliber of the left P2 segment just beyond the stent is likely exaggerated by artifact. Normal CTA of the neck.  MRI  Basilar cistern SAH and small IVH new from yesterday. ? SDH. Scattered acute infarcts B parietal, L occipital, R cerebellar.  CT head L basilar SAH stable. Trace IVH. Patchy hypodensity parietal, L occipital, R cerebellar infarcts. Post IR CT Basilar cistern subarachnoid hemorrhage is primarily on the left and stable from the MRI earlier today.No superimposed subdural hematoma suspected. No intra-axial blood identified.Trace associated IVH. No ventriculomegaly or significant intracranial mass effect at this time.     2D Echo pending  LDL 73 mg percent  HgbA1c 5.5  SCDs for VTE prophylaxis  aspirin 81 mg daily and clopidogrel 75 mg daily prior to admission, stopped IV heparinat 0800. now on aspirin 81 mg daily and clopidogrel 75 mg daily.  Therapy recommendations:  pending  Disposition:  pending  Acute Respiratory Failure  Intubated secondary to extreme agitation  Start precedex  CCM on board  extubated 06/27/19  Hypertension  Home meds:  verapamil 80 mg tid    BP goal 120-140 per IR x 24h  Multiple fluid boluses required to keep BP up   IVF increased to 100 this am  Stable but soft . Long-term BP goal normotensive  Dysphagia . Secondary to  stroke . NPO . Speech on board   Other Stroke Risk Factors  Cigarette smoker, advised to stop smoking  ETOH use, alcohol level <5, advised to drink no more than 2 drink(s) a day  Substance abuse - UDS:  Cocaine POSITIVE. Patient advised to stop using due to stroke risk.  Other Active Problems  Anxiety/depression  GERD  Hospital day # 2  Patient came in for elective basilar tip aneurysm coiling and postprocedure was noted to be agitated confused and blind and requiring intubation and sedation.  MRI scan subsequently performed shows biparietal left occipital and right cerebellar infarcts likely of embolic etiology from the procedure.  However urine drug screen is also positive for cocaine and recommend check echocardiogram to assess cardiac function and monitor for cardiac arrhythmias.  Continue aspirin and Plavix for his intracranial stent.  Mobilize out of bed.  Physical occupational therapy consults.  Transfer to floor bed.  Discussed with Dr. Mechele CollinJane Ellison critical care medicine and Dr. Blanchard ManeElizabeth Matthews [facility]ist.. Greater than 50% time during this 35-minute visit was spent on counseling and coordination of care and discussion with care team.     Delia HeadyPramod Ahana Najera, MD Medical Director [facility] Pager:  [phone] 06/28/2019 12:33 PM   To contact Stroke Continuity provider, please refer to [email or web address]. After hours, contact General Neurology

## 2019-06-28 NOTE — Progress Notes (Signed)
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  He began displaying signs of altered mental status with complaints of vision loss that progressively worsened to severe agitation and combativeness.  MRI showed basilar cistern subarachnoid hemorrhage and small volume intraventricular hemorrhage.  Drug screen was positive for cocaine. Patient continues to deny drug use.  His condition has greatly improved today.   Subjective:  C/o of frontal headache, 6/10. Asking for foley to be removed.  Allergies: Patient has no known allergies.  Medications: Prior to Admission medications   Medication Sig Start Date End Date Taking? Authorizing Provider  aspirin 81 MG chewable tablet Chew 81 mg by mouth daily.   Yes [provider]  clopidogrel (PLAVIX) 75 MG tablet Take 150 mg by mouth daily.   Yes [provider]  ibuprofen (ADVIL) 200 MG tablet Take 600 mg by mouth every 6 (six) hours as needed for headache.   Yes [provider]  verapamil (CALAN) 80 MG tablet Take 1 tablet (80 mg total) by mouth 3 (three) times daily. 02/20/19   Drema Dallas, DO     Vital Signs: BP 140/90   Pulse 65   Temp 98.1 F (36.7 C) (Oral)   Resp (!) 32   Ht  (1.803 m)   Wt 73.9 kg   SpO2 96%   BMI 22.73 kg/m   Physical Exam Eyes:     Comments: Vision much improved from yesterday. Can identify items correctly (pen, glove, fingers). A little difficulty with colors of items, knew glove was blue but said pen was blue and clear (acutally blue and white).  Cardiovascular:     Rate and Rhythm: Normal rate.  Pulmonary:     Effort: Pulmonary effort is normal. No respiratory distress.  Musculoskeletal:     Comments: Right CFA stick site ok, no  hematoma, no pseudoaneurysm.  Skin:    General: Skin is warm and dry.  Neurological:     Mental Status: He is oriented to person, place, and time.     Comments: Moves all 4's Oriented to place, day, month, year.  Psychiatric:        Thought Content: Thought content normal.     Imaging: EEG  Result Date: 06/26/2019 Charlsie Quest, MD     06/26/2019  4:45 PM Patient Name: Jacob Holder MRN: [id number] Epilepsy Attending: Charlsie Quest Referring Physician/Provider: Felicie Morn, PA Date: 06/26/2019 Duration: 22.25 mins Patient history: 28 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  EEG to evaluate for seizures. Level of alertness: Awake AEDs during EEG study: None Technical aspects: This EEG study was done with scalp electrodes positioned according to the 10-20 International system of electrode placement. Electrical activity was acquired at a sampling rate of  and reviewed with a high frequency filter of  and a low frequency filter of . EEG data were recorded continuously and digitally stored. Description: EEG was technically difficult due to significant myogenic artifact. No clear posterior dominant rhythm was seen. EEG showed continuous generalized 3-5 hz theta-delta slowing. Hyperventilation and photic stimulation were not performed. Abnormality - Continuous slow, generalized IMPRESSION: This technically difficult study is suggestive of moderate diffuse encephalopathy, non specific to etiology. No definite  seizures or epileptiform discharges were seen throughout the recording. Priyanka Annabelle Harman   CT ANGIO HEAD W OR WO CONTRAST  Result Date: 06/26/2019 CLINICAL DATA:  Loss of vision after neuro interventional procedure. Placement neuroform atlas stent across a wide necked basilar tip aneurysm. EXAM: CT ANGIOGRAPHY HEAD AND NECK TECHNIQUE: Multidetector CT imaging of the head and neck was performed using the standard  protocol during bolus administration of intravenous contrast. Multiplanar CT image reconstructions and MIPs were obtained to evaluate the vascular anatomy. Carotid stenosis measurements (when applicable) are obtained utilizing NASCET criteria, using the distal internal carotid diameter as the denominator. CONTRAST:  75mL OMNIPAQUE IOHEXOL 350 MG/ML SOLN COMPARISON:  None. FINDINGS: CTA NECK FINDINGS Aortic arch: A 3 vessel arch configuration is present. No significant atherosclerotic changes are present. No aneurysm or stenosis. Right carotid system: The right common carotid artery is within normal limits. Bifurcation is unremarkable. Cervical right ICA is normal. Left carotid system: The left common carotid artery is within normal limits. Bifurcation is unremarkable. The cervical left ICA is normal. Vertebral arteries: The vertebral arteries are codominant. There is no significant stenosis in either vertebral artery in the neck. Vertebral arteries originate the subclavian arteries without significant stenosis. Skeleton: Vertebral body heights alignment are maintained. Focal lytic or blastic lesions are present. Other neck: Soft tissues the neck are otherwise unremarkable. Upper chest: Lung apices are clear.  Thoracic inlet is normal. Review of the MIP images confirms the above findings CTA HEAD FINDINGS Anterior circulation: The internal carotid arteries are within normal limits through the ICA termini bilaterally. 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 Both A2 segments fill from right. M1 segments are normal bilaterally. MCA bifurcations are normal. ACA and MCA branch vessels are within normal limits. No significant proximal stenosis or occlusion is present. Posterior circulation: The vertebral arteries are codominant. PICA origins are visualized and normal. Vertebrobasilar junction is normal. Stent is visualized from the distal basilar artery into the left P2 segment. There is decreased caliber of the vessel just  distal to the stent. The appearance is likely exaggerated due to artifact. Distal branch vessels fill in the left PCA branches. Right PCA branches are within normal limits. Venous sinuses: The dural sinuses are patent. The straight sinus and deep cerebral veins are intact. Cortical veins are within normal limits. No vascular malformation is evident. Anatomic variants: None Review of the MIP images confirms the above findings IMPRESSION: 1. Arterial stent from the mid basilar artery into the left P2 segment. 2. Decreased caliber of the left P2 segment just beyond the stent is likely exaggerated by artifact. 3. No distal branch vessel occlusion to explain visual loss. 4. No occlusive disease involving the right PCA branch vessels. 5. No acute infarct is evident. 6. Normal CTA of the neck. These results were called by telephone at the time of interpretation on 06/26/2019 at 2:26 pm to provider [facility], who verbally acknowledged these results. Electronically Signed   By: Marin Roberts M.D.   On: 06/26/2019 14:40   CT HEAD WO CONTRAST  Result Date: 06/27/2019 CLINICAL DATA:  28 year old male status post basilar tip aneurysm stenting with loss of vision. Follow-up MRI reveals evidence of basilar subarachnoid hemorrhage and IVH. EXAM: CT HEAD WITHOUT CONTRAST TECHNIQUE: Contiguous axial images were obtained from the base of the skull through the vertex without intravenous contrast. COMPARISON:  Brain MRI 0335 hours today and earlier. FINDINGS: Brain: Small volume intraventricular hemorrhage in the occipital horns and 4th ventricle, also visible by MRI.  Basilar cistern subarachnoid hemorrhage primarily on the left: Interpeduncular, ambient cistern, prepontine, left CP angle, and pre medullary. The CT appearance is less convincing for subdural hematoma at the cisterna magna. Stable ventricle size, no ventriculomegaly. Patchy somewhat subtle hypodensity in the posterior parietal lobes, left occipital  lobe and right cerebellum corresponding to the acute infarcts by MRI. No parenchymal hemorrhage. No significant intracranial mass effect at this time. Gray-white matter differentiation elsewhere remains within normal limits. Vascular: Basilar artery through left PCA vascular stent is evident. Skull: No acute osseous abnormality identified. Sinuses/Orbits: Visualized paranasal sinuses and mastoids are stable and well pneumatized. Other: Intubated. Fluid in the pharynx. No acute orbit or scalp soft tissue finding. IMPRESSION: 1. Basilar cistern subarachnoid hemorrhage is primarily on the left and stable from the MRI earlier today. No superimposed subdural hematoma suspected. No intra-axial blood identified. 2. Trace associated IVH. No ventriculomegaly or significant intracranial mass effect at this time. 3. Patchy hypodensity associated with the acute parietal, left occipital, and right cerebellar infarcts. Electronically Signed   By: Genevie Ann M.D.   On: 06/27/2019 07:21   CT ANGIO NECK W OR WO CONTRAST  Result Date: 06/26/2019 CLINICAL DATA:  Loss of vision after neuro interventional procedure. Placement neuroform atlas stent across a wide necked basilar tip aneurysm. EXAM: CT ANGIOGRAPHY HEAD AND NECK TECHNIQUE: Multidetector CT imaging of the head and neck was performed using the standard protocol during bolus administration of intravenous contrast. Multiplanar CT image reconstructions and MIPs were obtained to evaluate the vascular anatomy. Carotid stenosis measurements (when applicable) are obtained utilizing NASCET criteria, using the distal internal carotid diameter as the denominator. CONTRAST:  32mL OMNIPAQUE IOHEXOL 350 MG/ML SOLN COMPARISON:  None. FINDINGS: CTA NECK FINDINGS Aortic arch: A 3 vessel arch configuration is present. No significant atherosclerotic changes are present. No aneurysm or stenosis. Right carotid system: The right common carotid artery is within normal limits. Bifurcation is  unremarkable. Cervical right ICA is normal. Left carotid system: The left common carotid artery is within normal limits. Bifurcation is unremarkable. The cervical left ICA is normal. Vertebral arteries: The vertebral arteries are codominant. There is no significant stenosis in either vertebral artery in the neck. Vertebral arteries originate the subclavian arteries without significant stenosis. Skeleton: Vertebral body heights alignment are maintained. Focal lytic or blastic lesions are present. Other neck: Soft tissues the neck are otherwise unremarkable. Upper chest: Lung apices are clear.  Thoracic inlet is normal. Review of the MIP images confirms the above findings CTA HEAD FINDINGS Anterior circulation: The internal carotid arteries are within normal limits through the ICA termini bilaterally. 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 Both A2 segments fill from right. M1 segments are normal bilaterally. MCA bifurcations are normal. ACA and MCA branch vessels are within normal limits. No significant proximal stenosis or occlusion is present. Posterior circulation: The vertebral arteries are codominant. PICA origins are visualized and normal. Vertebrobasilar junction is normal. Stent is visualized from the distal basilar artery into the left P2 segment. There is decreased caliber of the vessel just distal to the stent. The appearance is likely exaggerated due to artifact. Distal branch vessels fill in the left PCA branches. Right PCA branches are within normal limits. Venous sinuses: The dural sinuses are patent. The straight sinus and deep cerebral veins are intact. Cortical veins are within normal limits. No vascular malformation is evident. Anatomic variants: None Review of the MIP images confirms the above findings IMPRESSION: 1. Arterial stent from the mid basilar artery into the  left P2 segment. 2. Decreased caliber of the left P2 segment just beyond the stent is likely exaggerated by artifact. 3. No distal branch  vessel occlusion to explain visual loss. 4. No occlusive disease involving the right PCA branch vessels. 5. No acute infarct is evident. 6. Normal CTA of the neck. These results were called by telephone at the time of interpretation on 06/26/2019 at 2:26 pm to provider  [facility], who verbally acknowledged these results. Electronically Signed   By: Marin Roberts M.D.   On: 06/26/2019 14:40   MR BRAIN WO CONTRAST  Addendum Date: 06/27/2019   ADDENDUM REPORT: 06/27/2019 06:27 ADDENDUM: Study discussed by telephone with Dr. Caryl Pina on 06/27/2019 at 0616 hours. Electronically Signed   By: Odessa Fleming M.D.   On: 06/27/2019 06:27   Result Date: 06/27/2019 CLINICAL DATA:  28 year old male with loss of vision after NIR procedure, stent placed across basilar tip aneurysm. EXAM: MRI HEAD WITHOUT CONTRAST TECHNIQUE: Multiplanar, multiecho pulse sequences of the brain and surrounding structures were obtained without intravenous contrast. COMPARISON:  CTA head and neck yesterday.  Brain MRI 02/22/2019. FINDINGS: Brain: There is new high FLAIR signal and intermediate T1 signal in the basilar cisterns and posterior fossa suspicious for subarachnoid hemorrhage, including in the cisterna magna. Component of subdural blood is felt less likely. And there is associated intraventricular hemorrhage layering in the occipital horns, also the 4th ventricle. No superimposed ventriculomegaly. No midline shift or significant intracranial mass effect. Patchy areas of cortical and white matter restricted diffusion in both posterior parietal lobes, superior occipital lobes, the left occipital pole, left lateral occipital lobe, and a small area of the anterior and medial occipital lobe. Superimposed similar patchy restricted diffusion in the right cerebellum including the right tonsil. Cytotoxic edema with no parenchymal blood identified. No definite superimposed brainstem restricted diffusion. No thalamic restricted  diffusion. No anterior circulation restricted diffusion identified. Stable gray and white matter signal in the anterior circulation. Vascular: Major intracranial vascular flow voids appear stable compared to August. Basilar through left PCA stent better demonstrated by CTA. Skull and upper cervical spine: No definite hemorrhage in the visible upper cervical spine. Bone marrow signal remains normal. Sinuses/Orbits: Intubated. Fluid in the pharynx. Paranasal sinuses remain well pneumatized. Negative orbits. Other: Mastoids remain clear. IMPRESSION: 1. Basilar cistern subarachnoid hemorrhage and small volume intraventricular hemorrhage appears new from the CTA yesterday. Difficult to exclude a component of subdural hematoma at the foramen magnum. 2. No ventriculomegaly or significant intracranial mass effect at this time. 3. 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 No brainstem or anterior circulation involvement identified. No intra-axial hemorrhage identified. 4. Major intracranial vascular flow voids appear stable. Dr. Otelia Limes with Neurology was paged via AMION regarding the findings this exam on 06/27/2019 at 0553 hours. Electronically Signed: By: Odessa Fleming M.D. On: 06/27/2019 06:01   DG Chest Port 1 View  Result Date: 06/27/2019 CLINICAL DATA:  Endotracheal tube.  Recent aneurysm embolization. EXAM: PORTABLE CHEST 1 VIEW COMPARISON:  None. FINDINGS: Endotracheal tube has tip 7.8 cm above the carina. Lungs are adequately inflated without focal airspace consolidation or effusion. Cardiomediastinal silhouette is within normal. Remaining bones and soft tissues are normal. IMPRESSION: No active disease. Endotracheal tube with tip 7.8 cm above the carina. Electronically Signed   By: Elberta Fortis M.D.   On: 06/27/2019 07:10    Labs:  CBC: Recent Labs    03/05/19 1110 06/26/19 0635 06/26/19 1816 06/27/19 0252 06/28/19  0501  WBC 3.6* 4.1  --  9.7 5.5    HGB 14.8 14.8 13.9 12.8* 13.4  HCT 45.9 44.7 41.0 38.3* 40.8  PLT 99* 96*  --  103* 88*    COAGS: Recent Labs    03/05/19 1110 06/26/19 0635  INR 1.2 1.1    BMP: Recent Labs    03/05/19 1110 06/26/19 0635 06/26/19 1816 06/27/19 0252 06/28/19 0501  NA 139 137 140 137 140  K 3.9 3.7 3.8 3.9 3.6  CL 105 103  --  104 104  CO2 26 26  --  23 22  GLUCOSE 78 88  --  96 86  BUN 6 7  --  5* 7  CALCIUM 8.4* 8.8*  --  8.6* 8.5*  CREATININE 1.05 0.99  --  0.71 0.81  GFRNONAA >60 >60  --  >60 >60  GFRAA >60 >60  --  >60 >60    LIVER FUNCTION TESTS: No results for input(s): BILITOT, AST, ALT, ALKPHOS, PROT, ALBUMIN in the last 8760 hours.  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  He began displaying signs of altered mental status with complaints of vision loss that progressively worsened to severe agitation and combativeness.  MRI showed basilar cistern subarachnoid hemorrhage and small volume intraventricular hemorrhage.  Drug screen was positive for cocaine. Patient continues to deny drug use.  His condition has greatly improved today.   Will give 150 mg Plavix today, one time only.  Ok to remove Foley. Can be OOB in chair.  Wean Precedex.  Transfer to hospitalist service.  Electronically Signed: Gwynneth MacleodWENDY S Luciano Cinquemani, PA-C 06/28/2019, 9:04 AM    I spent a total of 25 Minutes at the the patient's bedside AND on the patient's hospital floor or unit, greater than 50% of which was counseling/coordinating care for f/u aneurysm embolization.

## 2019-06-28 NOTE — Progress Notes (Signed)
NAME:  Jacob Holder, MRN:  [id number], DOB:  [birth year], LOS: 2 ADMISSION DATE:  06/26/2019, CONSULTATION DATE:  12/23 REFERRING MD:  Dr. 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  Patient began displaying signs of altered mental status with complaints of vision loss that progressively worsened to severe agitation and combativeness.  PCCM consulted for further management.  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 Per IR team has been having difficulty getting procedure scheduled due to transportation issues and medication compliance.  Patient has been experiencing headaches with intermittent nausea prior to admission.  Denies any vision changes or other neurological symptoms.  Patient successfully underwent angiogram embolization of basilar aneurysm with Dr. Corliss Skains 12/23 without any acute complications.  However on repeat evaluation in PACU patient began experiencing vision changes stating "I cannot see" it appears that around the same time patient became confused with mild agitation.  Agitation quickly worsened and patient became significantly combative.  Neurology consulted and recommended obtaining MRI of the brain but due to combativeness unable to obtain.  Therefore decision was made to administer deep sedation to ensure compliance with imaging and  patient was intubated for airway protection prior to sedation.  PCCM consulted for further management  Past Medical History  Depression Prior suicidal ideations Basal artery aneurysm embolization GERD Headache  Significant Hospital Events   12/23 admitted for elective basilar artery aneurysm embolization 12/24 Extubated 12/25 Weaned off  precedex  Consults:  Neurology PCCM  Procedures:  12/23 basilar artery aneurysm embolization 12/23 intubated  12/24 extubated  Significant Diagnostic Tests:  CT angio head and neck 12/23 >  1. Arterial stent from the mid basilar artery into the left P2 segment. 2. Decreased caliber of the left P2 segment just beyond the stent is likely exaggerated by artifact. 3. No distal branch vessel occlusion to explain visual loss. 4. No occlusive disease involving the right PCA branch vessels. 5. No acute infarct is evident. 6. Normal CTA of the neck.  MRI 06/27/19> 1. Basilar cistern subarachnoid hemorrhage and small volume intraventricular hemorrhage appears new from the CTA yesterday. Difficult to exclude a component of subdural hematoma at the foramen magnum. 2. No ventriculomegaly or significant intracranial mass effect at this time. 3. 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 No brainstem or anterior circulation involvement identified. No intra-axial hemorrhage identified. 4. Major intracranial vascular flow voids appear stable.  Micro Data:  MRSA PCR 12/23 > Negative   Antimicrobials:   Ancef periop x1  Interim history/subjective:  Mental status improved. Awake, alert and appropriate  Objective   Blood pressure 140/90, pulse 65, temperature 98.1 F (36.7 C), temperature source Oral, resp. rate (!) 32, height 5\' 11"  (1.803 m), weight 73.9 kg, SpO2 96 %.        Intake/Output Summary (Last 24 hours) at 06/28/2019 0934 Last data filed at 06/28/2019 0700 Gross per 24 hour  Intake 340.47 ml  Output 802 ml  Net -461.53 ml   Filed Weights   06/26/19 0725  Weight: 73.9 kg   Physical Exam: General: Well-appearing, no acute distress, answer questions appropriately HENT: [place], AT, OP clear, MMM Eyes: EOMI, no scleral icterus Respiratory: Clear  to auscultation bilaterally.  No crackles, wheezing or rales  Cardiovascular: RRR, -M/R/G, no JVD GI: BS+, soft, nontender Extremities:-Edema,-tenderness Neuro: Awake, alert, oriented x 3. Improving gross vision. Follows commands. Moves extremities x 4. Skin: Intact, no rashes or bruising Psych: Normal mood, normal affect  Resolved Hospital Problem list   Acute hypoxemic respiratory failure  Assessment & Plan:  28 year old male admitted for embolization of known basilar artery aneurysm. Post-op patient developed confusion, bilateral blindness and aphasia. Due to his agitation he was intubated for airway protection to obtain urgent MRI which demonstrated SAH and scattered embolic infarcts.  Agitation requiring sedation - improved Hx polysubstance abuse +cocaine this admission Plan -Wean off Precedex -CIWA  Acute encephalopathy secondary to [facility], embolic infarcts, polysubstance withdrawal +/- delirium -improved Vision partially improving Plan -Neuro and NSG following. Post-op care and anticoagulation per consult teams. -Speech/PT/OT per Neuro -Neuro checks -Goal systolic blood pressure [phone]  No acute critical care needs. PCCM will sign off after today.  Best practice:  Diet: Regular Pain/Anxiety/Delirium protocol (if indicated): Wean Precedex VAP protocol (if indicated): Yes DVT prophylaxis: SCDs, heparin drip per neuro-pharmacy consult GI prophylaxis: PPI Glucose control: History of diabetes.  Follow with BMP, hypoglycemia protocol Mobility: Bedrest Code Status: Full Family Communication: Updated by primary Disposition: Will likely be transferred out of ICU by primary team after sedation off  Labs   CBC: Recent Labs  Lab 06/26/19 0635 06/26/19 1816 06/27/19 0252 06/28/19 0501  WBC 4.1  --  9.7 5.5  NEUTROABS 1.7  --   --   --   HGB 14.8 13.9 12.8* 13.4  HCT 44.7 41.0 38.3* 40.8  MCV 86.5  --  84.4 84.8  PLT 96*  --  103* 88*    Basic Metabolic Panel: Recent Labs  Lab 06/26/19 0635 06/26/19 1816 06/27/19 0252  06/28/19 0501  NA 137 140 137 140  K 3.7 3.8 3.9 3.6  CL 103  --  104 104  CO2 26  --  23 22  GLUCOSE 88  --  96 86  BUN 7  --  5* 7  CREATININE 0.99  --  0.71 0.81  CALCIUM 8.8*  --  8.6* 8.5*  MG  --   --  1.9  --   PHOS  --   --  2.6  --    GFR: Estimated Creatinine Clearance: 139.4 mL/min (by C-G formula based on SCr of 0.81 mg/dL). Recent Labs  Lab 06/26/19 0635 06/26/19 1820 06/27/19 0252 06/28/19 0501  PROCALCITON  --  <0.10 0.12  --   WBC 4.1  --  9.7 5.5    Liver Function Tests: No results for input(s): AST, ALT, ALKPHOS, BILITOT, PROT, ALBUMIN in the last 168 hours. No results for input(s): LIPASE, AMYLASE in the last 168 hours. Recent Labs  Lab 06/26/19 1820  AMMONIA 34    ABG    Component Value Date/Time   PHART 7.488 (H) 06/26/2019 1816   PCO2ART 37.8 06/26/2019 1816   PO2ART 641.0 (H) 06/26/2019 1816   HCO3 28.7 (H) 06/26/2019 1816   TCO2 30 06/26/2019 1816   O2SAT 100.0 06/26/2019 1816     Coagulation Profile: Recent Labs  Lab 06/26/19 0635  INR 1.1    Cardiac Enzymes: No results for input(s): CKTOTAL, CKMB, CKMBINDEX, TROPONINI in the last 168 hours.  HbA1C: Hgb A1c MFr Bld  Date/Time Value Ref Range Status  06/26/2019 06:20 PM 5.5 4.8 - 5.6 % Final    Comment:    (  NOTE) Pre diabetes:          5.7%-6.4% Diabetes:              >6.4% Glycemic control for   <7.0% adults with diabetes     CBG: Recent Labs  Lab 06/26/19 1609  GLUCAP 108*   The patient is critically ill with multiple organ systems failure and requires high complexity decision making for assessment and support, frequent evaluation and titration of therapies, application of advanced monitoring technologies and extensive interpretation of multiple databases.   Critical Care Time devoted to patient care services described in this note is 31 Minutes.   Mechele CollinJane Roneshia Drew, M.D. [facility] 06/28/2019 9:34 AM   Please see Amion for pager  number to reach on-call Pulmonary and Critical Care Team.

## 2019-06-29 DIAGNOSIS — E876 Hypokalemia: Secondary | ICD-10-CM

## 2019-06-29 DIAGNOSIS — F99 Mental disorder, not otherwise specified: Secondary | ICD-10-CM

## 2019-06-29 DIAGNOSIS — F5105 Insomnia due to other mental disorder: Secondary | ICD-10-CM

## 2019-06-29 DIAGNOSIS — I615 Nontraumatic intracerebral hemorrhage, intraventricular: Secondary | ICD-10-CM

## 2019-06-29 DIAGNOSIS — I63439 Cerebral infarction due to embolism of unspecified posterior cerebral artery: Secondary | ICD-10-CM

## 2019-06-29 DIAGNOSIS — F1410 Cocaine abuse, uncomplicated: Secondary | ICD-10-CM

## 2019-06-29 DIAGNOSIS — F17200 Nicotine dependence, unspecified, uncomplicated: Secondary | ICD-10-CM

## 2019-06-29 DIAGNOSIS — I609 Nontraumatic subarachnoid hemorrhage, unspecified: Secondary | ICD-10-CM

## 2019-06-29 LAB — CBC WITH DIFFERENTIAL/PLATELET
Abs Immature Granulocytes: 0.03 10*3/uL (ref 0.00–0.07)
Basophils Absolute: 0 10*3/uL (ref 0.0–0.1)
Basophils Relative: 0 %
Eosinophils Absolute: 0 10*3/uL (ref 0.0–0.5)
Eosinophils Relative: 0 %
HCT: 34.9 % — ABNORMAL LOW (ref 39.0–52.0)
Hemoglobin: 11.6 g/dL — ABNORMAL LOW (ref 13.0–17.0)
Immature Granulocytes: 1 %
Lymphocytes Relative: 29 %
Lymphs Abs: 1.5 10*3/uL (ref 0.7–4.0)
MCH: 27.5 pg (ref 26.0–34.0)
MCHC: 33.2 g/dL (ref 30.0–36.0)
MCV: 82.7 fL (ref 80.0–100.0)
Monocytes Absolute: 0.5 10*3/uL (ref 0.1–1.0)
Monocytes Relative: 9 %
Neutro Abs: 3.3 10*3/uL (ref 1.7–7.7)
Neutrophils Relative %: 61 %
Platelets: 89 10*3/uL — ABNORMAL LOW (ref 150–400)
RBC: 4.22 MIL/uL (ref 4.22–5.81)
RDW: 12 % (ref 11.5–15.5)
WBC: 5.3 10*3/uL (ref 4.0–10.5)
nRBC: 0 % (ref 0.0–0.2)

## 2019-06-29 LAB — COMPREHENSIVE METABOLIC PANEL
ALT: 21 U/L (ref 0–44)
AST: 30 U/L (ref 15–41)
Albumin: 2.9 g/dL — ABNORMAL LOW (ref 3.5–5.0)
Alkaline Phosphatase: 38 U/L (ref 38–126)
Anion gap: 13 (ref 5–15)
BUN: 6 mg/dL (ref 6–20)
CO2: 21 mmol/L — ABNORMAL LOW (ref 22–32)
Calcium: 7.8 mg/dL — ABNORMAL LOW (ref 8.9–10.3)
Chloride: 106 mmol/L (ref 98–111)
Creatinine, Ser: 0.86 mg/dL (ref 0.61–1.24)
GFR calc Af Amer: >60 mL/min (ref >60)
GFR calc non Af Amer: >60 mL/min (ref >60)
Glucose, Bld: 81 mg/dL (ref 70–99)
Potassium: 3.3 mmol/L — ABNORMAL LOW (ref 3.5–5.1)
Sodium: 140 mmol/L (ref 135–145)
Total Bilirubin: 1.1 mg/dL (ref 0.3–1.2)
Total Protein: 7.2 g/dL (ref 6.5–8.1)

## 2019-06-29 MED ORDER — POTASSIUM CHLORIDE CRYS ER 20 MEQ PO TBCR
40.0000 meq | EXTENDED_RELEASE_TABLET | Freq: Once | ORAL | Status: AC
  Administered 2019-06-29: 17:00:00 40 meq via ORAL
  Filled 2019-06-29: qty 2

## 2019-06-29 MED ORDER — TRAZODONE HCL 50 MG PO TABS
50.0000 mg | ORAL_TABLET | Freq: Every day | ORAL | Status: DC
  Administered 2019-06-29: 23:00:00 50 mg via ORAL
  Filled 2019-06-29: qty 1

## 2019-06-29 MED ORDER — ORAL CARE MOUTH RINSE
15.0000 mL | Freq: Two times a day (BID) | OROMUCOSAL | Status: DC
  Administered 2019-06-29 – 2019-06-30 (×3): 15 mL via OROMUCOSAL

## 2019-06-29 MED ORDER — POTASSIUM CHLORIDE CRYS ER 20 MEQ PO TBCR
20.0000 meq | EXTENDED_RELEASE_TABLET | Freq: Two times a day (BID) | ORAL | Status: AC
  Administered 2019-06-29 (×2): 20 meq via ORAL
  Filled 2019-06-29 (×2): qty 1

## 2019-06-29 NOTE — Progress Notes (Signed)
Rehab Admissions Coordinator Note:  Per OT recommendation, this patient was screened by Raechel Ache for appropriateness for an Inpatient Acute Rehab Consult.    At this time, we are recommending an Inpatient Rehab consult. Please have attending MD place consult order to allow for further assessment and possible rehab admit.   Raechel Ache 06/29/2019, 3:32 PM  I can be reached at [phone].

## 2019-06-29 NOTE — Progress Notes (Signed)
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 States he feels much better and has not vomited since yesterday - per RN at bedside patient with one episode of vomiting this AM which appeared to be slightly red in color, she also notes his vomiting seems to improve with pain medication. He reports his headache has improved and is now a 6/10. He thinks his vision as continued to improve but he does not have his contacts in currently. He is asking to go home and is very upset that he will likely not be able to go home for a few more days, he is asking repeatedly why he cannot go home and stating "I have to go home, I've been here for 5 days already." He can vaguely recall some of the events on 12/23 and 12/24.   Allergies: Patient has no known allergies.  Medications: Prior to Admission medications   Medication Sig Start Date End Date Taking? Authorizing Provider  aspirin 81 MG chewable tablet Chew 81 mg by mouth daily.   Yes [provider]  clopidogrel (PLAVIX) 75 MG tablet Take 75 mg by mouth daily.    Yes [provider]  ibuprofen (ADVIL) 200 MG tablet Take 600 mg by mouth every 6 (six) hours as needed for headache.   Yes [provider]  verapamil (CALAN) 80 MG tablet Take 1 tablet (80 mg total) by mouth 3 (three) times daily. Patient not taking: Reported on 06/28/2019 02/20/19   Drema Dallas, DO     Vital Signs: BP 123/80    Pulse 86    Temp 98.2 F (36.8 C) (Oral)    Resp (!) 32    Ht  (1.803 m)    Wt 163 lb (73.9 kg)    SpO2 99%    BMI 22.73 kg/m   Physical Exam Vitals and nursing note reviewed.  Constitutional:      General: He is not  in acute distress. HENT:     Head: Normocephalic.  Cardiovascular:     Rate and Rhythm: Normal rate.  Pulmonary:     Effort: Pulmonary effort is normal.  Skin:    General: Skin is warm and dry.  Neurological:     Mental Status: He is alert.   Alert, awake, and oriented x3 (able to state he is at "[facility] hospital", year is 2020, month is December) Speech and comprehension improving - comprehension appears to be intact however some persistent difficulty with word finding (cannot state "thumb" or "pinky" when asked to identify - can only say "it's a finger and I know the word but I can't get it out." Has pronounced slur with words such as "[employer]" and "[employer]") PERRL bilaterally EOMs without nystagmus or subjective diplopia. Visual fields - intact bilaterally; peripheral vision appears to be nearly completely in tact. Unable to see clock/calendar clearly however wears corrective lenses which are not present at time of exam No facial asymmetry. Tongue midline Motor power in tact bilaterally Negative pronator drift. Fine motor and coordination in tact bilaterally Gait not assessed Romberg not assessed Heel to toe not assessed Distal pulses palpable bilaterally   Imaging: EEG  Result Date: 06/26/2019 Charlsie Quest, MD     06/26/2019  4:45  PM Patient Name: Jacob Holder MRN: [id number] Epilepsy Attending: Charlsie Quest Referring Physician/Provider: Felicie Morn, PA Date: 06/26/2019 Duration: 22.25 mins Patient history: 28 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  EEG to evaluate for seizures. Level of alertness: Awake AEDs during EEG study: None Technical aspects: This EEG study was done with scalp electrodes positioned according to the 10-20 International system of electrode placement. Electrical activity was acquired at a sampling rate of  and reviewed with a high frequency filter of  and a low frequency  filter of . EEG data were recorded continuously and digitally stored. Description: EEG was technically difficult due to significant myogenic artifact. No clear posterior dominant rhythm was seen. EEG showed continuous generalized 3-5 hz theta-delta slowing. Hyperventilation and photic stimulation were not performed. Abnormality - Continuous slow, generalized IMPRESSION: This technically difficult study is suggestive of moderate diffuse encephalopathy, non specific to etiology. No definite seizures or epileptiform discharges were seen throughout the recording. Priyanka Annabelle Harman   CT ANGIO HEAD W OR WO CONTRAST  Result Date: 06/26/2019 CLINICAL DATA:  Loss of vision after neuro interventional procedure. Placement neuroform atlas stent across a wide necked basilar tip aneurysm. EXAM: CT ANGIOGRAPHY HEAD AND NECK TECHNIQUE: Multidetector CT imaging of the head and neck was performed using the standard protocol during bolus administration of intravenous contrast. Multiplanar CT image reconstructions and MIPs were obtained to evaluate the vascular anatomy. Carotid stenosis measurements (when applicable) are obtained utilizing NASCET criteria, using the distal internal carotid diameter as the denominator. CONTRAST:  75mL OMNIPAQUE IOHEXOL 350 MG/ML SOLN COMPARISON:  None. FINDINGS: CTA NECK FINDINGS Aortic arch: A 3 vessel arch configuration is present. No significant atherosclerotic changes are present. No aneurysm or stenosis. Right carotid system: The right common carotid artery is within normal limits. Bifurcation is unremarkable. Cervical right ICA is normal. Left carotid system: The left common carotid artery is within normal limits. Bifurcation is unremarkable. The cervical left ICA is normal. Vertebral arteries: The vertebral arteries are codominant. There is no significant stenosis in either vertebral artery in the neck. Vertebral arteries originate the subclavian arteries without significant stenosis.  Skeleton: Vertebral body heights alignment are maintained. Focal lytic or blastic lesions are present. Other neck: Soft tissues the neck are otherwise unremarkable. Upper chest: Lung apices are clear.  Thoracic inlet is normal. Review of the MIP images confirms the above findings CTA HEAD FINDINGS Anterior circulation: The internal carotid arteries are within normal limits through the ICA termini bilaterally. 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 Both A2 segments fill from right. M1 segments are normal bilaterally. MCA bifurcations are normal. ACA and MCA branch vessels are within normal limits. No significant proximal stenosis or occlusion is present. Posterior circulation: The vertebral arteries are codominant. PICA origins are visualized and normal. Vertebrobasilar junction is normal. Stent is visualized from the distal basilar artery into the left P2 segment. There is decreased caliber of the vessel just distal to the stent. The appearance is likely exaggerated due to artifact. Distal branch vessels fill in the left PCA branches. Right PCA branches are within normal limits. Venous sinuses: The dural sinuses are patent. The straight sinus and deep cerebral veins are intact. Cortical veins are within normal limits. No vascular malformation is evident. Anatomic variants: None Review of the MIP images confirms the above findings IMPRESSION: 1. Arterial stent from the mid basilar artery into the left P2 segment. 2. Decreased caliber of the left P2 segment just  beyond the stent is likely exaggerated by artifact. 3. No distal branch vessel occlusion to explain visual loss. 4. No occlusive disease involving the right PCA branch vessels. 5. No acute infarct is evident. 6. Normal CTA of the neck. These results were called by telephone at the time of interpretation on 06/26/2019 at 2:26 pm to provider [facility], who verbally acknowledged these results. Electronically Signed   By: Marin Roberts M.D.   On:  06/26/2019 14:40   CT HEAD WO CONTRAST  Result Date: 06/28/2019 CLINICAL DATA:  Nausea and vomiting. Subarachnoid hemorrhage. EXAM: CT HEAD WITHOUT CONTRAST TECHNIQUE: Contiguous axial images were obtained from the base of the skull through the vertex without intravenous contrast. COMPARISON:  MR head without contrast 06/27/2019. CTA head and neck 07/26/2018 FINDINGS: Brain: Fluid levels in the ventricles are less well seen than prior exam. 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 Infarct along medial aspect of the left occipital lobe noted. A right PICA territory infarct is noted. There is expected increasing hypoattenuation within each of these infarcts. No new infarcts are present. There is no expansion of suspected hemorrhage. Stent is noted. Anterior circulation territories are within limits. Vascular: Basilar artery to left PCA stent is again noted. No vascular calcifications are present. Skull: Calvarium is intact. No focal lytic or blastic lesions are present. No significant extracranial soft tissue lesion is present. Sinuses/Orbits: The paranasal sinuses and mastoid air cells are clear. The globes and orbits are within normal limits. IMPRESSION: 1. 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 2. Expected evolution of right PICA territory infarct. 3. No new infarcts. 4. Stable appearance of distal basilar artery to left PCA stent. 5. Decreasing intraventricular blood products. 6. No new hemorrhage. Electronically Signed   By: Marin Roberts M.D.   On: 06/28/2019 14:44   CT HEAD WO CONTRAST  Result Date: 06/27/2019 CLINICAL DATA:  28 year old male status post basilar tip aneurysm stenting with loss of vision. Follow-up MRI reveals evidence of basilar subarachnoid hemorrhage and IVH. EXAM: CT HEAD WITHOUT CONTRAST TECHNIQUE: Contiguous axial images were obtained from the base of the skull through the vertex without intravenous contrast. COMPARISON:   Brain MRI 0335 hours today and earlier. FINDINGS: Brain: Small volume intraventricular hemorrhage in the occipital horns and 4th ventricle, also visible by MRI. Basilar cistern subarachnoid hemorrhage primarily on the left: Interpeduncular, ambient cistern, prepontine, left CP angle, and pre medullary. The CT appearance is less convincing for subdural hematoma at the cisterna magna. Stable ventricle size, no ventriculomegaly. Patchy somewhat subtle hypodensity in the posterior parietal lobes, left occipital lobe and right cerebellum corresponding to the acute infarcts by MRI. No parenchymal hemorrhage. No significant intracranial mass effect at this time. Gray-white matter differentiation elsewhere remains within normal limits. Vascular: Basilar artery through left PCA vascular stent is evident. Skull: No acute osseous abnormality identified. Sinuses/Orbits: Visualized paranasal sinuses and mastoids are stable and well pneumatized. Other: Intubated. Fluid in the pharynx. No acute orbit or scalp soft tissue finding. IMPRESSION: 1. Basilar cistern subarachnoid hemorrhage is primarily on the left and stable from the MRI earlier today. No superimposed subdural hematoma suspected. No intra-axial blood identified. 2. Trace associated IVH. No ventriculomegaly or significant intracranial mass effect at this time. 3. Patchy hypodensity associated with the acute parietal, left occipital, and right cerebellar infarcts. Electronically Signed   By: Odessa Fleming M.D.   On: 06/27/2019 07:21   CT ANGIO NECK W OR WO CONTRAST  Result Date: 06/26/2019 CLINICAL DATA:  Loss  of vision after neuro interventional procedure. Placement neuroform atlas stent across a wide necked basilar tip aneurysm. EXAM: CT ANGIOGRAPHY HEAD AND NECK TECHNIQUE: Multidetector CT imaging of the head and neck was performed using the standard protocol during bolus administration of intravenous contrast. Multiplanar CT image reconstructions and MIPs were obtained  to evaluate the vascular anatomy. Carotid stenosis measurements (when applicable) are obtained utilizing NASCET criteria, using the distal internal carotid diameter as the denominator. CONTRAST:  37mL OMNIPAQUE IOHEXOL 350 MG/ML SOLN COMPARISON:  None. FINDINGS: CTA NECK FINDINGS Aortic arch: A 3 vessel arch configuration is present. No significant atherosclerotic changes are present. No aneurysm or stenosis. Right carotid system: The right common carotid artery is within normal limits. Bifurcation is unremarkable. Cervical right ICA is normal. Left carotid system: The left common carotid artery is within normal limits. Bifurcation is unremarkable. The cervical left ICA is normal. Vertebral arteries: The vertebral arteries are codominant. There is no significant stenosis in either vertebral artery in the neck. Vertebral arteries originate the subclavian arteries without significant stenosis. Skeleton: Vertebral body heights alignment are maintained. Focal lytic or blastic lesions are present. Other neck: Soft tissues the neck are otherwise unremarkable. Upper chest: Lung apices are clear.  Thoracic inlet is normal. Review of the MIP images confirms the above findings CTA HEAD FINDINGS Anterior circulation: The internal carotid arteries are within normal limits through the ICA termini bilaterally. 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 Both A2 segments fill from right. M1 segments are normal bilaterally. MCA bifurcations are normal. ACA and MCA branch vessels are within normal limits. No significant proximal stenosis or occlusion is present. Posterior circulation: The vertebral arteries are codominant. PICA origins are visualized and normal. Vertebrobasilar junction is normal. Stent is visualized from the distal basilar artery into the left P2 segment. There is decreased caliber of the vessel just distal to the stent. The appearance is likely exaggerated due to artifact. Distal branch vessels fill in the left PCA branches.  Right PCA branches are within normal limits. Venous sinuses: The dural sinuses are patent. The straight sinus and deep cerebral veins are intact. Cortical veins are within normal limits. No vascular malformation is evident. Anatomic variants: None Review of the MIP images confirms the above findings IMPRESSION: 1. Arterial stent from the mid basilar artery into the left P2 segment. 2. Decreased caliber of the left P2 segment just beyond the stent is likely exaggerated by artifact. 3. No distal branch vessel occlusion to explain visual loss. 4. No occlusive disease involving the right PCA branch vessels. 5. No acute infarct is evident. 6. Normal CTA of the neck. These results were called by telephone at the time of interpretation on 06/26/2019 at 2:26 pm to provider [facility], who verbally acknowledged these results. Electronically Signed   By: Marin Roberts M.D.   On: 06/26/2019 14:40   MR BRAIN WO CONTRAST  Addendum Date: 06/27/2019   ADDENDUM REPORT: 06/27/2019 06:27 ADDENDUM: Study discussed by telephone with Dr. Caryl Pina on 06/27/2019 at 0616 hours. Electronically Signed   By: Odessa Fleming M.D.   On: 06/27/2019 06:27   Result Date: 06/27/2019 CLINICAL DATA:  28 year old male with loss of vision after NIR procedure, stent placed across basilar tip aneurysm. EXAM: MRI HEAD WITHOUT CONTRAST TECHNIQUE: Multiplanar, multiecho pulse sequences of the brain and surrounding structures were obtained without intravenous contrast. COMPARISON:  CTA head and neck yesterday.  Brain MRI 02/22/2019. FINDINGS: Brain: There is new high FLAIR signal and intermediate T1 signal in the  basilar cisterns and posterior fossa suspicious for subarachnoid hemorrhage, including in the cisterna magna. Component of subdural blood is felt less likely. And there is associated intraventricular hemorrhage layering in the occipital horns, also the 4th ventricle. No superimposed ventriculomegaly. No midline shift or significant  intracranial mass effect. Patchy areas of cortical and white matter restricted diffusion in both posterior parietal lobes, superior occipital lobes, the left occipital pole, left lateral occipital lobe, and a small area of the anterior and medial occipital lobe. Superimposed similar patchy restricted diffusion in the right cerebellum including the right tonsil. Cytotoxic edema with no parenchymal blood identified. No definite superimposed brainstem restricted diffusion. No thalamic restricted diffusion. No anterior circulation restricted diffusion identified. Stable gray and white matter signal in the anterior circulation. Vascular: Major intracranial vascular flow voids appear stable compared to August. Basilar through left PCA stent better demonstrated by CTA. Skull and upper cervical spine: No definite hemorrhage in the visible upper cervical spine. Bone marrow signal remains normal. Sinuses/Orbits: Intubated. Fluid in the pharynx. Paranasal sinuses remain well pneumatized. Negative orbits. Other: Mastoids remain clear. IMPRESSION: 1. Basilar cistern subarachnoid hemorrhage and small volume intraventricular hemorrhage appears new from the CTA yesterday. Difficult to exclude a component of subdural hematoma at the foramen magnum. 2. No ventriculomegaly or significant intracranial mass effect at this time. 3. 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 No brainstem or anterior circulation involvement identified. No intra-axial hemorrhage identified. 4. Major intracranial vascular flow voids appear stable. Dr. Otelia Limes with Neurology was paged via AMION regarding the findings this exam on 06/27/2019 at 0553 hours. Electronically Signed: By: Odessa Fleming M.D. On: 06/27/2019 06:01   DG Chest Port 1 View  Result Date: 06/27/2019 CLINICAL DATA:  Endotracheal tube.  Recent aneurysm embolization. EXAM: PORTABLE CHEST 1 VIEW COMPARISON:  None. FINDINGS: Endotracheal tube  has tip 7.8 cm above the carina. Lungs are adequately inflated without focal airspace consolidation or effusion. Cardiomediastinal silhouette is within normal. Remaining bones and soft tissues are normal. IMPRESSION: No active disease. Endotracheal tube with tip 7.8 cm above the carina. Electronically Signed   By: Elberta Fortis M.D.   On: 06/27/2019 07:10   ECHOCARDIOGRAM COMPLETE  Result Date: 06/28/2019   ECHOCARDIOGRAM REPORT   Patient Name:   Jacob Holder Date of Exam: 06/28/2019 Medical Rec #:  [id number]       Height:       71.0 in Accession #:    [id number]      Weight:       163.0 lb Date of Birth:  1990-09-19      BSA:          1.93 m Patient Age:    28 years        BP:           108/81 mmHg Patient Gender: M               HR:           75 bpm. Exam Location:  Inpatient Procedure: 2D Echo Indications:    Stroke  History:        Patient has no prior history of Echocardiogram examinations.  Sonographer:    Thurman Coyer RDCS (AE) Referring Phys: [id number] SHARON L BIBY IMPRESSIONS  1. Left ventricular ejection fraction, by visual estimation, is 60 to 65%. The left ventricle has normal function. There is no left ventricular hypertrophy.  2. The left ventricle has no regional wall motion abnormalities.  3. Global right ventricle has normal systolic function.The right ventricular size is normal. No increase in right ventricular wall thickness.  4. Left atrial size was normal.  5. Right atrial size was normal.  6. The mitral valve is normal in structure. Mild mitral valve regurgitation.  7. The tricuspid valve is normal in structure.  8. The aortic valve is tricuspid. Aortic valve regurgitation is not visualized. No evidence of aortic valve sclerosis or stenosis.  9. The pulmonic valve was normal in structure. Pulmonic valve regurgitation is not visualized. 10. The inferior vena cava is normal in size with greater than 50% respiratory variability, suggesting right atrial pressure of 3 mmHg. FINDINGS  Left  Ventricle: Left ventricular ejection fraction, by visual estimation, is 60 to 65%. The left ventricle has normal function. The left ventricle has no regional wall motion abnormalities. The left ventricular internal cavity size was the left ventricle is normal in size. There is no left ventricular hypertrophy. Left ventricular diastolic parameters were normal. Right Ventricle: The right ventricular size is normal. No increase in right ventricular wall thickness. Global RV systolic function is has normal systolic function. Left Atrium: Left atrial size was normal in size. Right Atrium: Right atrial size was normal in size Pericardium: There is no evidence of pericardial effusion. Mitral Valve: The mitral valve is normal in structure. Mild mitral valve regurgitation. Tricuspid Valve: The tricuspid valve is normal in structure. Tricuspid valve regurgitation is trivial. Aortic Valve: The aortic valve is tricuspid. Aortic valve regurgitation is not visualized. The aortic valve is structurally normal, with no evidence of sclerosis or stenosis. Pulmonic Valve: The pulmonic valve was normal in structure. Pulmonic valve regurgitation is not visualized. Pulmonic regurgitation is not visualized. Aorta: The aortic root is normal in size and structure. Venous: The inferior vena cava is normal in size with greater than 50% respiratory variability, suggesting right atrial pressure of 3 mmHg. IAS/Shunts: No atrial level shunt detected by color flow Doppler.  LEFT VENTRICLE PLAX 2D LVIDd:         5.40 cm  Diastology LVIDs:         3.50 cm  LV e' lateral:   11.60 cm/s LV PW:         0.80 cm  LV E/e' lateral: 7.9 LV IVS:        0.70 cm  LV e' medial:    11.70 cm/s LVOT diam:     2.10 cm  LV E/e' medial:  7.9 LV SV:         90 ml LV SV Index:   47.00 LVOT Area:     3.46 cm  RIGHT VENTRICLE RV S prime:     17.30 cm/s TAPSE (M-mode): 2.7 cm LEFT ATRIUM             Index       RIGHT ATRIUM           Index LA diam:        3.70 cm 1.91  cm/m  RA Area:     15.20 cm LA Vol (A2C):   53.5 ml 27.68 ml/m RA Volume:   38.50 ml  19.92 ml/m LA Vol (A4C):   44.8 ml 23.18 ml/m LA Biplane Vol: 49.0 ml 25.35 ml/m  AORTIC VALVE LVOT Vmax:   110.00 cm/s LVOT Vmean:  66.200 cm/s LVOT VTI:    0.192 m  AORTA Ao Root diam: 3.10 cm MITRAL VALVE MV Area (PHT): 3.23 cm  SHUNTS MV PHT:        68.15 msec           Systemic VTI:  0.19 m MV Decel Time: 235 msec             Systemic Diam: 2.10 cm MV E velocity: 92.00 cm/s 103 cm/s MV A velocity: 64.90 cm/s 70.3 cm/s MV E/A ratio:  1.42       1.5  Prentice DockerSuresh Koneswaran MD Electronically signed by Prentice DockerSuresh Koneswaran MD Signature Date/Time: 06/28/2019/4:11:29 PM    Final     Labs:  CBC: Recent Labs    06/26/19 [id number] 06/26/19 1816 06/27/19 0252 06/28/19 0501 06/29/19 0548  WBC 4.1  --  9.7 5.5 5.3  HGB 14.8 13.9 12.8* 13.4 11.6*  HCT 44.7 41.0 38.3* 40.8 34.9*  PLT 96*  --  103* 88* 89*    COAGS: Recent Labs    03/05/19 1110 06/26/19 0635  INR 1.2 1.1    BMP: Recent Labs    06/26/19 0635 06/26/19 1816 06/27/19 0252 06/28/19 0501 06/29/19 0548  NA 137 140 137 140 140  K 3.7 3.8 3.9 3.6 3.3*  CL 103  --  104 104 106  CO2 26  --  23 22 21*  GLUCOSE 88  --  96 86 81  BUN 7  --  5* 7 6  CALCIUM 8.8*  --  8.6* 8.5* 7.8*  CREATININE 0.99  --  0.71 0.81 0.86  GFRNONAA >60  --  >60 >60 >60  GFRAA >60  --  >60 >60 >60    LIVER FUNCTION TESTS: Recent Labs    06/29/19 0548  BILITOT 1.1  AST 30  ALT 21  ALKPHOS 38  PROT 7.2  ALBUMIN 2.9*    Assessment and Plan:  28 y/o M with 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 Corliss Skainseveshwar and post procedure basilar cistern subarachnoid hemorrhage and small volume intraventricular hemorrhage seen today for routine follow up.  Neuro exam improving - still with some difficulty with word finding, but peripheral visual fields appear to be fully intact bilaterally today. Continues with 6/10 HA and intermittent  vomiting - patient denies vomiting today however RN states emesis x1 this AM which appeared to be slightly red. He has not yet eaten breakfast, last PO intake was mashed potatoes last night. Hgb decreased to 11.6 (previously 13.4 yesterday), plt stable at 89.   Discussed events from date of procedure up to today including results of CT this AM which showed no new hemorrhages and improvement of previous hemorrhages - explicitly discussed cocaine use as likely cause of SAH/ICH as well as danger of leaving AMA at this time as patient is very angry that he will need to stay for a few more days. He continues to deny cocaine use. Patient continues to repeat that he wants to leave today - NIR/staff continue to encourage patient to remain in house and plan to discuss with his grandmother today.  Continue to monitor for over s/s of bleeding, continue scheduled Tylenol, PRN fentanyl for breakthrough pain, out of bed and to chair with assistance today. NIR will continue to follow.   Electronically Signed: Villa HerbShannon A Mercer Stallworth, PA-C 06/29/2019, 10:09 AM   I spent a total of 25 Minutes at the the patient's bedside AND on the patient's hospital floor or unit, greater than 50% of which was counseling/coordinating care for follow up aneurysm embolization and post procedure SAH/ICH.

## 2019-06-29 NOTE — Evaluation (Signed)
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 Pt with signs of AMS with compliants of vision loss progressing to severe agititation and combativeness. PMH substance abuse,depression, suicidal ideations, Gerd, HA  Clinical Impression  Pt admitted with above. Pt with impaired vision, impaired co-ordination requiring modA for safe ambulation and minA for safe transfers. Pt with impaired depth perception and difficulty with ADLs. Pt was indep, living alone, and working PTA. Recommend CIR Upon d/c for maximal functional recovery. Reports he has support of significant other who can stay with him at all time. Acute PT to cont to follow.    Follow Up Recommendations CIR    Equipment Recommendations  (TBD at next venue)    Recommendations for Other Services Rehab consult     Precautions / Restrictions Precautions Precautions: Fall Restrictions Weight Bearing Restrictions: No      Mobility  Bed Mobility Overal bed mobility: Needs Assistance Bed Mobility: Supine to Sit     Supine to sit: Min assist     General bed mobility comments: pt sleeping on arrival but once EOB states "yall woke me up . i like yall" pt requires (A) to sequence task and extended sitting   Transfers Overall transfer level: Needs assistance Equipment used: Rolling walker (2 wheeled) Transfers: Sit to/from Stand Sit to Stand: +2 physical assistance;Mod assist         General transfer comment: first attempt required two people for safety. pt attempting to sit back down then attempting to move to chair when given cues to move to bathroom level sink. visual deficits and cognitive deficits with this initial task making it difficult for patient  Ambulation/Gait Ambulation/Gait assistance: Min assist;Mod  assist Gait Distance (Feet): 120 Feet Assistive device: IV Pole Gait Pattern/deviations: Step-through pattern;Decreased stride length;Staggering right;Staggering left Gait velocity: dec Gait velocity interpretation: <1.8 ft/sec, indicate of risk for recurrent falls General Gait Details: due to impaired vision pt with unsteady gait and difficulty navigating the hallways, attempted to amb without AD however very difficult. Pt given IV pole to push, more steady  Stairs            Wheelchair Mobility    Modified Rankin (Stroke Patients Only) Modified Rankin (Stroke Patients Only) Pre-Morbid Rankin Score: No symptoms Modified Rankin: Moderate disability     Balance Overall balance assessment: Needs assistance Sitting-balance support: Bilateral upper extremity supported;Feet supported Sitting balance-Leahy Scale: Fair     Standing balance support: Single extremity supported;During functional activity Standing balance-Leahy Scale: Fair                               Pertinent Vitals/Pain Pain Assessment: No/denies pain    Home Living Family/patient expects to be discharged to:: Private residence Living Arrangements: Alone Available Help at Discharge: Friend(s);Available PRN/intermittently Type of Home: Apartment Home Access: Stairs to enter Entrance Stairs-Rails: None(patient reports no hand rails) Entrance Stairs-Number of Steps: 3rd floor Home Layout: One level Home Equipment: None Additional Comments: 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 Pt must manage 20 calls in 4 hours    Prior Function Level of Independence: Independent               Hand Dominance   Dominant Hand: Right    Extremity/Trunk Assessment  Upper Extremity Assessment Upper Extremity Assessment: Overall WFL for tasks assessed    Lower Extremity Assessment Lower Extremity Assessment: Generalized weakness(mild co-ordination deficits)    Cervical /  Trunk Assessment Cervical / Trunk Assessment: Normal  Communication   Communication: No difficulties  Cognition Arousal/Alertness: Awake/alert Behavior During Therapy: Restless Overall Cognitive Status: Impaired/Different from baseline Area of Impairment: Attention;Following commands;Safety/judgement;Awareness;Problem solving                   Current Attention Level: Sustained   Following Commands: Follows one step commands with increased time Safety/Judgement: Decreased awareness of safety;Decreased awareness of deficits Awareness: Intellectual Problem Solving: Slow processing;Difficulty sequencing General Comments: pt reports today as Dec 21. when educated yesterday was CHristmas pt states "oh its 26th. REally ? its the 26th?" Pt requires redirection to bed and unable to correctly idenify R from L throughout session.       General Comments General comments (skin integrity, edema, etc.): pt taken to bathroom with OT, observed pt attempting to eat ice cream, pt over shooting and difficulty placing spoon in cup and having hard time getting ice cream to mouth without it falling off spoon    Exercises     Assessment/Plan    PT Assessment Patient needs continued PT services  PT Problem List Decreased strength;Decreased activity tolerance;Decreased range of motion;Decreased balance;Decreased mobility       PT Treatment Interventions DME instruction;Gait training;Stair training;Functional mobility training;Therapeutic activities;Therapeutic exercise;Balance training;Neuromuscular re-education;Cognitive remediation    PT Goals (Current goals can be found in the Care Plan section)  Acute Rehab PT Goals Patient Stated Goal: to get ice cream PT Goal Formulation: With patient Time For Goal Achievement: 07/13/19 Potential to Achieve Goals: Good    Frequency Min 4X/week   Barriers to discharge Decreased caregiver support however states significant other can stay with him     Co-evaluation PT/OT/SLP Co-Evaluation/Treatment: Yes Reason for Co-Treatment: For patient/therapist safety PT goals addressed during session: Mobility/safety with mobility OT goals addressed during session: ADL's and self-care       AM-PAC PT "6 Clicks" Mobility  Outcome Measure Help needed turning from your back to your side while in a flat bed without using bedrails?: A Little Help needed moving from lying on your back to sitting on the side of a flat bed without using bedrails?: A Little Help needed moving to and from a bed to a chair (including a wheelchair)?: A Little Help needed standing up from a chair using your arms (e.g., wheelchair or bedside chair)?: A Lot Help needed to walk in hospital room?: A Lot Help needed climbing 3-5 steps with a railing? : A Lot 6 Click Score: 15    End of Session Equipment Utilized During Treatment: Gait belt Activity Tolerance: Patient tolerated treatment well Patient left: in chair;with call bell/phone within reach;with chair alarm set Nurse Communication: Mobility status PT Visit Diagnosis: Unsteadiness on feet (R26.81);Difficulty in walking, not elsewhere classified (R26.2)    Time: 2376-2831 PT Time Calculation (min) (ACUTE ONLY): 33 min   Charges:   PT Evaluation $PT Eval Moderate Complexity: 1 Mod          Kittie Plater, PT, DPT Acute Rehabilitation Services Pager #: [phone] Office #: [phone]   Berline Lopes 06/29/2019, 3:34 PM

## 2019-06-29 NOTE — Progress Notes (Signed)
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  Postprocedure he is a started experiencing vision changes and blindness with confusion and agitation.  Neurology was consulted recommended MRI of the brain patient was combative.  He was put on deep sedation to get MRI and was intubated for airway protection.  Intubated 06/26/2019 extubated 06/27/2019.  He was weaned off Precedex 06/28/2019.  He was seen in consultation by neurology and PCCM. He has a history of depression, suicidal ideations, GERD and headaches. MRI 06/27/2019 basilar cistern subarachnoid hemorrhage and small volume intraventricular hemorrhage scattered acute infarcts in the bilateral parietal, left occipital lobe and right cerebellum. 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  PT OT and speech eval-pending. Neuro recommends aspirin 81 mg daily with Plavix 75 mg daily for intracranial stent. SCDs for VT prophylaxis. His LDL is 73. Hemoglobin A1c is 5.5. Follow-up echo 06/29/2019 normal ejection fraction Patient was intubated for airway protection with extreme agitation intubated 06/26/2019 extubated 06/27/2019.   #2  polysubstance abuse with urine drug screen positive for cocaine patient was weaned off Precedex .  Had to restart the last night due to agitation.  #3 hypertension on verapamil 80 mg 3 times a day at home.  Here the blood pressure has been soft and was given IV fluids.  Hold verapamil blood pressure 121/74 Continue normal saline.  #4 tobacco abuse counseled to quit smoking  #5 alcohol abuse counseled to quit drinking  #6 substance abuse urine drug screen positive for cocaine counseled against use of polysubstance abuse.  #7 dysphagia followed by speech therapy.  #8 thrombocytopenia platelets trending down 89 today.monitor closely on aspirin and Plavix status post heparin.  #9 hypokalemia replete       Nutrition Problem: Inadequate oral intake Etiology: inability to eat     Signs/Symptoms: NPO status    Interventions: Refer to RD note for recommendations  Estimated body mass index is 22.73 kg/m as calculated from the following:   Height as of this encounter: 5\' 11"  (1.803 m).   Weight as of this encounter: 73.9 kg.  DVT prophylaxis: SCD Code Status: Full code Family Communication: None  disposition Plan: Pending clinical improvement Consultants: pccm neurology, neurosurgery   Subjective: Patient is anxious repeatedly asking to go home.  Foley catheter removed 06/28/2019 able to urinate without any difficulty.  He is able to see better today than yesterday.  He describes all the colors that I showed him appropriately  Objective: Vitals:   06/29/19 1000 06/29/19 1100 06/29/19 1200 06/29/19 1300  BP: (!) 141/80 123/66 108/60 121/74  Pulse: 83 76 80 80  Resp: 16 (!) 29 16   Temp:   99 F (37.2 C)   TempSrc:   Oral   SpO2: 99% 98% 99% 100%  Weight:  Height:        Intake/Output Summary (Last 24 hours) at 06/29/2019 1515 Last data filed at 06/29/2019 0100 Gross per 24 hour  Intake 500 ml  Output 450 ml  Net 50 ml   Filed Weights   06/26/19  0725  Weight: 73.9 kg    Examination:  General exam: Appears calm and comfortable  Respiratory system: Clear to auscultation. Respiratory effort normal. Cardiovascular system: S1 & S2 heard, RRR. No JVD, murmurs, rubs, gallops or clicks. No pedal edema. Gastrointestinal system: Abdomen is nondistended, soft and nontender. No organomegaly or masses felt. Normal bowel sounds heard. Central nervous system: Alert and oriented. No focal neurological deficits. Extremities: Symmetric 5 x 5 power. Skin: No rashes, lesions or ulcers Psychiatry: Judgement and insight appear normal. Mood & affect appropriate.     Data Reviewed: I have personally reviewed following labs and imaging studies  CBC: Recent Labs  Lab 06/26/19 0635 06/26/19 1816 06/27/19 0252 06/28/19 0501 06/29/19 0548  WBC 4.1  --  9.7 5.5 5.3  NEUTROABS 1.7  --   --   --  3.3  HGB 14.8 13.9 12.8* 13.4 11.6*  HCT 44.7 41.0 38.3* 40.8 34.9*  MCV 86.5  --  84.4 84.8 82.7  PLT 96*  --  103* 88* 89*   Basic Metabolic Panel: Recent Labs  Lab 06/26/19 0635 06/26/19 1816 06/27/19 0252 06/28/19 0501 06/29/19 0548  NA 137 140 137 140 140  K 3.7 3.8 3.9 3.6 3.3*  CL 103  --  104 104 106  CO2 26  --  23 22 21*  GLUCOSE 88  --  96 86 81  BUN 7  --  5* 7 6  CREATININE 0.99  --  0.71 0.81 0.86  CALCIUM 8.8*  --  8.6* 8.5* 7.8*  MG  --   --  1.9  --   --   PHOS  --   --  2.6  --   --    GFR: Estimated Creatinine Clearance: 131.3 mL/min (by C-G formula based on SCr of 0.86 mg/dL). Liver Function Tests: Recent Labs  Lab 06/29/19 0548  AST 30  ALT 21  ALKPHOS 38  BILITOT 1.1  PROT 7.2  ALBUMIN 2.9*   No results for input(s): LIPASE, AMYLASE in the last 168 hours. Recent Labs  Lab 06/26/19 1820  AMMONIA 34   Coagulation Profile: Recent Labs  Lab 06/26/19 0635  INR 1.1   Cardiac Enzymes: No results for input(s): CKTOTAL, CKMB, CKMBINDEX, TROPONINI in the last 168 hours. BNP (last 3 results) No results for  input(s): PROBNP in the last 8760 hours. HbA1C: Recent Labs    06/26/19 1820  HGBA1C 5.5   CBG: Recent Labs  Lab 06/26/19 1609  GLUCAP 108*   Lipid Profile: Recent Labs    06/27/19 0252 06/28/19 0501  CHOL  --  133  HDL  --  24*  LDLCALC  --  73  TRIG 223* 178*  CHOLHDL  --  5.5   Thyroid Function Tests: No results for input(s): TSH, T4TOTAL, FREET4, T3FREE, THYROIDAB in the last 72 hours. Anemia Panel: No results for input(s): VITAMINB12, FOLATE, FERRITIN, TIBC, IRON, RETICCTPCT in the last 72 hours. Sepsis Labs: Recent Labs  Lab 06/26/19 1820 06/27/19 0252  PROCALCITON <0.10 0.12    Recent Results (from the past 240 hour(s))  Novel Coronavirus, NAA (Hosp order, Send-out to Ref Lab; TAT 18-24 hrs     Status: None   Collection Time: 06/22/19  4:52  PM   Specimen: Nasopharyngeal Swab; Respiratory  Result Value Ref Range Status   SARS-CoV-2, NAA NOT DETECTED NOT DETECTED Final    Comment: (NOTE) This nucleic acid amplification test was developed and its performance characteristics determined by [employer]. Nucleic acid amplification tests include PCR and TMA. This test has not been FDA cleared or approved. This test has been authorized by FDA under an Emergency Use Authorization (EUA). This test is only authorized for the duration of time the declaration that circumstances exist justifying the authorization of the emergency use of in vitro diagnostic tests for detection of SARS-CoV-2 virus and/or diagnosis of COVID-19 infection under section 564(b)(1) of the Act, 21 U.S.C. 161WRU-0(A) (1), unless the authorization is terminated or revoked sooner. When diagnostic testing is negative, the possibility of a false negative result should be considered in the context of a patient's recent exposures and the presence of clinical signs and symptoms consistent with COVID-19. An individual without symptoms of COVID- 19 and who is not shedding SARS-CoV-2 vi rus  would expect to have a negative (not detected) result in this assay. Performed At: [facility] [address] [place], [place] [id number] Jolene Schimke MD YN:[id number]    Coronavirus Source NASOPHARYNGEAL  Final    Comment: Performed at [facility], [address] [place], [place] [address]  MRSA PCR Screening     Status: None   Collection Time: 06/26/19  6:20 PM   Specimen: Nasal Mucosa; Nasopharyngeal  Result Value Ref Range Status   MRSA by PCR NEGATIVE NEGATIVE Final    Comment:        The GeneXpert MRSA Assay (FDA approved for NASAL specimens only), is one component of a comprehensive MRSA colonization surveillance program. It is not intended to diagnose MRSA infection nor to guide or monitor treatment for MRSA infections. Performed at [facility], [address] [place], [place] [address]          Radiology Studies: CT HEAD WO CONTRAST  Result Date: 06/28/2019 CLINICAL DATA:  Nausea and vomiting. Subarachnoid hemorrhage. EXAM: CT HEAD WITHOUT CONTRAST TECHNIQUE: Contiguous axial images were obtained from the base of the skull through the vertex without intravenous contrast. COMPARISON:  MR head without contrast 06/27/2019. CTA head and neck 07/26/2018 FINDINGS: Brain: Fluid levels in the ventricles are less well seen than prior exam. 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 Infarct along medial aspect of the left occipital lobe noted. A right PICA territory infarct is noted. There is expected increasing hypoattenuation within each of these infarcts. No new infarcts are present. There is no expansion of suspected hemorrhage. Stent is noted. Anterior circulation territories are within limits. Vascular: Basilar artery to left PCA stent is again noted. No vascular calcifications are present. Skull: Calvarium is intact. No focal lytic or blastic lesions are present. No significant extracranial soft tissue lesion is present.  Sinuses/Orbits: The paranasal sinuses and mastoid air cells are clear. The globes and orbits are within normal limits. IMPRESSION: 1. 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 2. Expected evolution of right PICA territory infarct. 3. No new infarcts. 4. Stable appearance of distal basilar artery to left PCA stent. 5. Decreasing intraventricular blood products. 6. No new hemorrhage. Electronically Signed   By: Marin Roberts M.D.   On: 06/28/2019 14:44   ECHOCARDIOGRAM COMPLETE  Result Date: 06/28/2019   ECHOCARDIOGRAM REPORT   Patient Name:   Jacob Holder Date of Exam: 06/28/2019 Medical Rec #:  [id number]  Height:       71.0 in Accession #:    [id number]      Weight:       163.0 lb Date of Birth:  04-Feb-1991      BSA:          1.93 m Patient Age:    28 years        BP:           108/81 mmHg Patient Gender: M               HR:           75 bpm. Exam Location:  Inpatient Procedure: 2D Echo Indications:    Stroke  History:        Patient has no prior history of Echocardiogram examinations.  Sonographer:    Thurman Coyer RDCS (AE) Referring Phys: [id number] SHARON L BIBY IMPRESSIONS  1. Left ventricular ejection fraction, by visual estimation, is 60 to 65%. The left ventricle has normal function. There is no left ventricular hypertrophy.  2. The left ventricle has no regional wall motion abnormalities.  3. Global right ventricle has normal systolic function.The right ventricular size is normal. No increase in right ventricular wall thickness.  4. Left atrial size was normal.  5. Right atrial size was normal.  6. The mitral valve is normal in structure. Mild mitral valve regurgitation.  7. The tricuspid valve is normal in structure.  8. The aortic valve is tricuspid. Aortic valve regurgitation is not visualized. No evidence of aortic valve sclerosis or stenosis.  9. The pulmonic valve was normal in structure. Pulmonic valve regurgitation is not visualized. 10. The inferior  vena cava is normal in size with greater than 50% respiratory variability, suggesting right atrial pressure of 3 mmHg. FINDINGS  Left Ventricle: Left ventricular ejection fraction, by visual estimation, is 60 to 65%. The left ventricle has normal function. The left ventricle has no regional wall motion abnormalities. The left ventricular internal cavity size was the left ventricle is normal in size. There is no left ventricular hypertrophy. Left ventricular diastolic parameters were normal. Right Ventricle: The right ventricular size is normal. No increase in right ventricular wall thickness. Global RV systolic function is has normal systolic function. Left Atrium: Left atrial size was normal in size. Right Atrium: Right atrial size was normal in size Pericardium: There is no evidence of pericardial effusion. Mitral Valve: The mitral valve is normal in structure. Mild mitral valve regurgitation. Tricuspid Valve: The tricuspid valve is normal in structure. Tricuspid valve regurgitation is trivial. Aortic Valve: The aortic valve is tricuspid. Aortic valve regurgitation is not visualized. The aortic valve is structurally normal, with no evidence of sclerosis or stenosis. Pulmonic Valve: The pulmonic valve was normal in structure. Pulmonic valve regurgitation is not visualized. Pulmonic regurgitation is not visualized. Aorta: The aortic root is normal in size and structure. Venous: The inferior vena cava is normal in size with greater than 50% respiratory variability, suggesting right atrial pressure of 3 mmHg. IAS/Shunts: No atrial level shunt detected by color flow Doppler.  LEFT VENTRICLE PLAX 2D LVIDd:         5.40 cm  Diastology LVIDs:         3.50 cm  LV e' lateral:   11.60 cm/s LV PW:         0.80 cm  LV E/e' lateral: 7.9 LV IVS:        0.70 cm  LV e' medial:    11.70 cm/s  LVOT diam:     2.10 cm  LV E/e' medial:  7.9 LV SV:         90 ml LV SV Index:   47.00 LVOT Area:     3.46 cm  RIGHT VENTRICLE RV S prime:      17.30 cm/s TAPSE (M-mode): 2.7 cm LEFT ATRIUM             Index       RIGHT ATRIUM           Index LA diam:        3.70 cm 1.91 cm/m  RA Area:     15.20 cm LA Vol (A2C):   53.5 ml 27.68 ml/m RA Volume:   38.50 ml  19.92 ml/m LA Vol (A4C):   44.8 ml 23.18 ml/m LA Biplane Vol: 49.0 ml 25.35 ml/m  AORTIC VALVE LVOT Vmax:   110.00 cm/s LVOT Vmean:  66.200 cm/s LVOT VTI:    0.192 m  AORTA Ao Root diam: 3.10 cm MITRAL VALVE MV Area (PHT): 3.23 cm             SHUNTS MV PHT:        68.15 msec           Systemic VTI:  0.19 m MV Decel Time: 235 msec             Systemic Diam: 2.10 cm MV E velocity: 92.00 cm/s 103 cm/s MV A velocity: 64.90 cm/s 70.3 cm/s MV E/A ratio:  1.42       1.5  Prentice Docker MD Electronically signed by Prentice Docker MD Signature Date/Time: 06/28/2019/4:11:29 PM    Final         Scheduled Meds: . aspirin  81 mg Oral Daily  . Chlorhexidine Gluconate Cloth  6 each Topical Daily  . clopidogrel  75 mg Oral Daily  . folic acid  1 mg Oral Daily  . mouth rinse  15 mL Mouth Rinse BID  . multivitamin with minerals  1 tablet Oral Daily  . nicotine  21 mg Transdermal Daily  . potassium chloride  20 mEq Oral BID  . thiamine  100 mg Oral Daily  . traZODone  50 mg Oral QHS   Continuous Infusions: . sodium chloride 100 mL/hr at 06/28/19 1730  . sodium chloride    . dexmedetomidine (PRECEDEX) IV infusion Stopped (06/28/19 0847)     LOS: 3 days     Alwyn Ren, MD [facility]  If 7PM-7AM, please contact night-coverage [email or web address] Password [email or web address] 06/29/2019, 3:15 PM

## 2019-06-29 NOTE — Progress Notes (Signed)
NAME:  Elmar Antigua, MRN:  [id number], DOB:  1991-03-18, LOS: 3 ADMISSION DATE:  06/26/2019, CONSULTATION DATE:  12/23 REFERRING MD:  Dr. 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  Patient began displaying signs of altered mental status with complaints of vision loss that progressively worsened to severe agitation and combativeness.  PCCM consulted for further management.  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 Per IR team has been having difficulty getting procedure scheduled due to transportation issues and medication compliance.  Patient has been experiencing headaches with intermittent nausea prior to admission.  Denies any vision changes or other neurological symptoms.  Patient successfully underwent angiogram embolization of basilar aneurysm with Dr. Estanislado Pandy 12/23 without any acute complications.  However on repeat evaluation in PACU patient began experiencing vision changes stating "I cannot see" it appears that around the same time patient became confused with mild agitation.  Agitation quickly worsened and patient became significantly combative.  Neurology consulted and recommended obtaining MRI of the brain but due to combativeness unable to obtain.  Therefore decision was made to administer deep sedation to ensure compliance with imaging and  patient was intubated for airway protection prior to sedation.  PCCM consulted for further management  Past Medical History  Depression Prior suicidal ideations Basal artery aneurysm embolization GERD Headache  Significant Hospital Events   12/23 admitted for elective basilar artery aneurysm embolization 12/24 Extubated 12/25 Weaned off  precedex  Consults:  Neurology PCCM  Procedures:  12/23 basilar artery aneurysm embolization 12/23 intubated  12/24 extubated  Significant Diagnostic Tests:  CT angio head and neck 12/23 >  1. Arterial stent from the mid basilar artery into the left P2 segment. 2. Decreased caliber of the left P2 segment just beyond the stent is likely exaggerated by artifact. 3. No distal branch vessel occlusion to explain visual loss. 4. No occlusive disease involving the right PCA branch vessels. 5. No acute infarct is evident. 6. Normal CTA of the neck.  MRI 06/27/19> 1. Basilar cistern subarachnoid hemorrhage and small volume intraventricular hemorrhage appears new from the CTA yesterday. Difficult to exclude a component of subdural hematoma at the foramen magnum. 2. No ventriculomegaly or significant intracranial mass effect at this time. 3. 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 No brainstem or anterior circulation involvement identified. No intra-axial hemorrhage identified. 4. Major intracranial vascular flow voids appear stable.  Micro Data:  MRSA PCR 12/23 > Negative   Antimicrobials:   Ancef periop x1  Interim history/subjective:  Endorses desire to leave AMA stating he just wants to "chill at home not here"   Objective   Blood pressure 123/80, pulse 86, temperature 98.2 F (36.8 C), temperature source Oral, resp. rate (!) 32, height 5\' 11"  (1.803 m), weight 73.9 kg, SpO2 99 %.        Intake/Output Summary (Last 24 hours) at 06/29/2019 1013 Last data filed at 06/29/2019 0100 Gross per 24 hour  Intake 903.2 ml  Output 450 ml  Net 453.2 ml   Filed Weights   06/26/19 0725  Weight: 73.9 kg   Physical Exam: General: WDWN adult M NAD HENT: NCAT anicteric sclera. Pink mmm ,  trachea midline  Respiratory: CTA bilaterally no accessory muscle use  Cardiovascular:RRR s1s2 no rgm Cap refill < 3 seconds    Extremities:Symmetrical bulk and tone no c/c/e Neuro: AAOx4 following commands PERRL.  Skin: c/d/w/i without rash  Psych: agitated mood and apathetic affect. Appropriate insight.   Resolved Hospital Problem list   Acute hypoxemic respiratory failure  Assessment & Plan:  28 year old male admitted for embolization of known basilar artery aneurysm. Post-op patient developed confusion, bilateral blindness and aphasia. Due to his agitation he was intubated for airway protection to obtain urgent MRI which demonstrated SAH and scattered embolic infarcts.   PCCM signed off 12/25. 12/26 Primary team asked PCCM to re-engage to restart Precedex and speak with patient about leaving AMA. Long discussion with pt 12/26 regarding AMA vs staying inpatient. Patient agrees to stay 12/26 if we are able to help him sleep. He states that he will want to leave [facility] 12/27 regardless. I advise that leaving AMA is not recommended and we can further discuss 12/27 if his wishes are to leave AMA at that time.   Agitation requiring sedation - improved Hx polysubstance abuse +cocaine this admission Plan -CIWA -Back on Precedex, PCCM back following in this setting.    Acute encephalopathy secondary to [facility], embolic infarcts, polysubstance withdrawal +/- delirium -improved Vision partially improving Plan -Neuro and NSG following -Speech/PT/OT per Neuro -Neuro checks -Goal systolic blood pressure 120-140  Best practice:  Diet: Regular Pain/Anxiety/Delirium protocol (if indicated): Precedex VAP protocol (if indicated): Yes DVT prophylaxis: SCDs, heparin drip per neuro-pharmacy consult GI prophylaxis: PPI Glucose control: History of diabetes.  Follow with BMP, hypoglycemia protocol Mobility: Bedrest Code Status: Full Family Communication: Pt updated  Disposition: Fluctuating in desire to leave AMA. Currently in ICU as precedex is back on   Labs   CBC: Recent Labs  Lab 06/26/19 0635 06/26/19 1816  06/27/19 0252 06/28/19 0501 06/29/19 0548  WBC 4.1  --  9.7 5.5 5.3  NEUTROABS 1.7  --   --   --  3.3  HGB 14.8 13.9 12.8* 13.4 11.6*  HCT 44.7 41.0 38.3* 40.8 34.9*  MCV 86.5  --  84.4 84.8 82.7  PLT 96*  --  103* 88* 89*    Basic Metabolic Panel: Recent Labs  Lab 06/26/19 0635 06/26/19 1816 06/27/19 0252 06/28/19 0501 06/29/19 0548  NA 137 140 137 140 140  K 3.7 3.8 3.9 3.6 3.3*  CL 103  --  104 104 106  CO2 26  --  23 22 21*  GLUCOSE 88  --  96 86 81  BUN 7  --  5* 7 6  CREATININE 0.99  --  0.71 0.81 0.86  CALCIUM 8.8*  --  8.6* 8.5* 7.8*  MG  --   --  1.9  --   --   PHOS  --   --  2.6  --   --    GFR: Estimated Creatinine Clearance: 131.3 mL/min (by C-G formula based on SCr of 0.86 mg/dL). Recent Labs  Lab 06/26/19 0635 06/26/19 1820 06/27/19 0252 06/28/19 0501 06/29/19 0548  PROCALCITON  --  <0.10 0.12  --   --   WBC 4.1  --  9.7 5.5 5.3    Liver Function Tests: Recent Labs  Lab 06/29/19 0548  AST 30  ALT 21  ALKPHOS 38  BILITOT 1.1  PROT 7.2  ALBUMIN 2.9*   No results for input(s): LIPASE, AMYLASE in the last 168 hours. Recent Labs  Lab  06/26/19 1820  AMMONIA 34    ABG    Component Value Date/Time   PHART 7.488 (H) 06/26/2019 1816   PCO2ART 37.8 06/26/2019 1816   PO2ART 641.0 (H) 06/26/2019 1816   HCO3 28.7 (H) 06/26/2019 1816   TCO2 30 06/26/2019 1816   O2SAT 100.0 06/26/2019 1816     Coagulation Profile: Recent Labs  Lab 06/26/19 0635  INR 1.1    Cardiac Enzymes: No results for input(s): CKTOTAL, CKMB, CKMBINDEX, TROPONINI in the last 168 hours.  HbA1C: Hgb A1c MFr Bld  Date/Time Value Ref Range Status  06/26/2019 06:20 PM 5.5 4.8 - 5.6 % Final    Comment:    (NOTE) Pre diabetes:          5.7%-6.4% Diabetes:              >6.4% Glycemic control for   <7.0% adults with diabetes     CBG: Recent Labs  Lab 06/26/19 1609  GLUCAP 108*   CRITICAL CARE Performed by: Lanier ClamGrace E Coolidge Gossard   Total critical care time:35  minutes  Critical care time was exclusive of separately billable procedures and treating other patients.  Critical care was necessary to treat or prevent imminent or life-threatening deterioration.  Critical care was time spent personally by me on the following activities: development of treatment plan with patient and/or surrogate as well as nursing, discussions with consultants, evaluation of patient's response to treatment, examination of patient, obtaining history from patient or surrogate, ordering and performing treatments and interventions, ordering and review of laboratory studies, ordering and review of radiographic studies, pulse oximetry and re-evaluation of patient's condition.

## 2019-06-29 NOTE — Progress Notes (Signed)
STROKE TEAM PROGRESS NOTE   INTERVAL HISTORY Patient RN at bedside. Pt lying in bed, sleepy but arousable and fully cooperative on exam. Still has right lower quadrantanopia. Moving all extremities. Pt asked for AMA this morning. CCM was able to persuade him to stay. He is on low dose precedex now for mild agitation.   Vitals:   06/29/19 0200 06/29/19 0400 06/29/19 0500 06/29/19 0600  BP: 115/67  122/68 123/80  Pulse: 69  92 86  Resp:      Temp:  98.2 F (36.8 C)    TempSrc:  Oral    SpO2: 98%  99% 99%  Weight:      Height:        CBC:  Recent Labs  Lab 06/26/19 0635 06/26/19 1816 06/28/19 0501 06/29/19 0548  WBC 4.1   < > 5.5 5.3  NEUTROABS 1.7  --   --  3.3  HGB 14.8  --  13.4 11.6*  HCT 44.7  --  40.8 34.9*  MCV 86.5   < > 84.8 82.7  PLT 96*   < > 88* 89*   < > = values in this interval not displayed.    Basic Metabolic Panel:  Recent Labs  Lab 06/27/19 0252 06/28/19 0501 06/29/19 0548  NA 137 140 140  K 3.9 3.6 3.3*  CL 104 104 106  CO2 23 22 21*  GLUCOSE 96 86 81  BUN 5* 7 6  CREATININE 0.71 0.81 0.86  CALCIUM 8.6* 8.5* 7.8*  MG 1.9  --   --   PHOS 2.6  --   --    Lipid Panel:     Component Value Date/Time   CHOL 133 06/28/2019 0501   TRIG 178 (H) 06/28/2019 0501   HDL 24 (L) 06/28/2019 0501   CHOLHDL 5.5 06/28/2019 0501   VLDL 36 06/28/2019 0501   LDLCALC 73 06/28/2019 0501   HgbA1c:  Lab Results  Component Value Date   HGBA1C 5.5 06/26/2019   Urine Drug Screen:     Component Value Date/Time   LABOPIA NONE DETECTED 06/26/2019 1828   COCAINSCRNUR POSITIVE (A) 06/26/2019 1828   LABBENZ POSITIVE (A) 06/26/2019 1828   AMPHETMU NONE DETECTED 06/26/2019 1828   THCU NONE DETECTED 06/26/2019 1828   LABBARB NONE DETECTED 06/26/2019 1828    Alcohol Level     Component Value Date/Time   ETH <5 11/30/2016 1358    IMAGING CT HEAD WO CONTRAST  Result Date: 06/28/2019 CLINICAL DATA:  Nausea and vomiting. Subarachnoid hemorrhage. EXAM: CT  HEAD WITHOUT CONTRAST TECHNIQUE: Contiguous axial images were obtained from the base of the skull through the vertex without intravenous contrast. COMPARISON:  MR head without contrast 06/27/2019. CTA head and neck 07/26/2018 FINDINGS: Brain: Fluid levels in the ventricles are less well seen than prior exam. 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 Infarct along medial aspect of the left occipital lobe noted. A right PICA territory infarct is noted. There is expected increasing hypoattenuation within each of these infarcts. No new infarcts are present. There is no expansion of suspected hemorrhage. Stent is noted. Anterior circulation territories are within limits. Vascular: Basilar artery to left PCA stent is again noted. No vascular calcifications are present. Skull: Calvarium is intact. No focal lytic or blastic lesions are present. No significant extracranial soft tissue lesion is present. Sinuses/Orbits: The paranasal sinuses and mastoid air cells are clear. The globes and orbits are within normal limits. IMPRESSION: 1. 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  2. Expected evolution of right PICA territory infarct. 3. No new infarcts. 4. Stable appearance of distal basilar artery to left PCA stent. 5. Decreasing intraventricular blood products. 6. No new hemorrhage. Electronically Signed   By: Marin Roberts M.D.   On: 06/28/2019 14:44   ECHOCARDIOGRAM COMPLETE  Result Date: 06/28/2019   ECHOCARDIOGRAM REPORT   Patient Name:   Jacob Holder Date of Exam: 06/28/2019 Medical Rec #:  [id number]       Height:       71.0 in Accession #:    [id number]      Weight:       163.0 lb Date of Birth:  Jan 08, 1991      BSA:          1.93 m Patient Age:    28 years        BP:           108/81 mmHg Patient Gender: M               HR:           75 bpm. Exam Location:  Inpatient Procedure: 2D Echo Indications:    Stroke  History:        Patient has no prior history of  Echocardiogram examinations.  Sonographer:    Thurman Coyer RDCS (AE) Referring Phys: [id number] SHARON L BIBY IMPRESSIONS  1. Left ventricular ejection fraction, by visual estimation, is 60 to 65%. The left ventricle has normal function. There is no left ventricular hypertrophy.  2. The left ventricle has no regional wall motion abnormalities.  3. Global right ventricle has normal systolic function.The right ventricular size is normal. No increase in right ventricular wall thickness.  4. Left atrial size was normal.  5. Right atrial size was normal.  6. The mitral valve is normal in structure. Mild mitral valve regurgitation.  7. The tricuspid valve is normal in structure.  8. The aortic valve is tricuspid. Aortic valve regurgitation is not visualized. No evidence of aortic valve sclerosis or stenosis.  9. The pulmonic valve was normal in structure. Pulmonic valve regurgitation is not visualized. 10. The inferior vena cava is normal in size with greater than 50% respiratory variability, suggesting right atrial pressure of 3 mmHg. FINDINGS  Left Ventricle: Left ventricular ejection fraction, by visual estimation, is 60 to 65%. The left ventricle has normal function. The left ventricle has no regional wall motion abnormalities. The left ventricular internal cavity size was the left ventricle is normal in size. There is no left ventricular hypertrophy. Left ventricular diastolic parameters were normal. Right Ventricle: The right ventricular size is normal. No increase in right ventricular wall thickness. Global RV systolic function is has normal systolic function. Left Atrium: Left atrial size was normal in size. Right Atrium: Right atrial size was normal in size Pericardium: There is no evidence of pericardial effusion. Mitral Valve: The mitral valve is normal in structure. Mild mitral valve regurgitation. Tricuspid Valve: The tricuspid valve is normal in structure. Tricuspid valve regurgitation is trivial. Aortic  Valve: The aortic valve is tricuspid. Aortic valve regurgitation is not visualized. The aortic valve is structurally normal, with no evidence of sclerosis or stenosis. Pulmonic Valve: The pulmonic valve was normal in structure. Pulmonic valve regurgitation is not visualized. Pulmonic regurgitation is not visualized. Aorta: The aortic root is normal in size and structure. Venous: The inferior vena cava is normal in size with greater than 50% respiratory variability, suggesting right atrial pressure of 3 mmHg. IAS/Shunts: No  atrial level shunt detected by color flow Doppler.  LEFT VENTRICLE PLAX 2D LVIDd:         5.40 cm  Diastology LVIDs:         3.50 cm  LV e' lateral:   11.60 cm/s LV PW:         0.80 cm  LV E/e' lateral: 7.9 LV IVS:        0.70 cm  LV e' medial:    11.70 cm/s LVOT diam:     2.10 cm  LV E/e' medial:  7.9 LV SV:         90 ml LV SV Index:   47.00 LVOT Area:     3.46 cm  RIGHT VENTRICLE RV S prime:     17.30 cm/s TAPSE (M-mode): 2.7 cm LEFT ATRIUM             Index       RIGHT ATRIUM           Index LA diam:        3.70 cm 1.91 cm/m  RA Area:     15.20 cm LA Vol (A2C):   53.5 ml 27.68 ml/m RA Volume:   38.50 ml  19.92 ml/m LA Vol (A4C):   44.8 ml 23.18 ml/m LA Biplane Vol: 49.0 ml 25.35 ml/m  AORTIC VALVE LVOT Vmax:   110.00 cm/s LVOT Vmean:  66.200 cm/s LVOT VTI:    0.192 m  AORTA Ao Root diam: 3.10 cm MITRAL VALVE MV Area (PHT): 3.23 cm             SHUNTS MV PHT:        68.15 msec           Systemic VTI:  0.19 m MV Decel Time: 235 msec             Systemic Diam: 2.10 cm MV E velocity: 92.00 cm/s 103 cm/s MV A velocity: 64.90 cm/s 70.3 cm/s MV E/A ratio:  1.42       1.5  Kate Sable MD Electronically signed by Kate Sable MD Signature Date/Time: 06/28/2019/4:11:29 PM    Final     PHYSICAL EXAM Young African-American male who is not in distress.  He is restless. . Afebrile. Head is nontraumatic. Neck is supple without bruit.    Cardiac exam no murmur or gallop. Lungs are clear  to auscultation. Distal pulses are well felt. Neurological Exam ;  Patient is sleepy on precedex but able to fully cooperative with exam. Orientated to time, place and age and self. His speech is clear without any dysarthria.  He follows commands well.  He still has right lowe quadrantanopia. No gaze preference. Pupils are equal reactive.  Fundi not visualized.  There is no facial weakness.  Tongue midline. Able to move all 4 extremities equally well against gravity without any focal weakness. FTN intact. Complains of right facial decreased light touch, about 75% of the left, but BUEs and BLEs symmetric sensation. Gait not tested.   ASSESSMENT/PLAN Jacob Holder is a 28 y.o. 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     Stroke:  Scattered bilateral embolic infarcts with SAH, IVH s/p BA aneurysm repair  Cerebral angio - s/p staged embolization of large wide neck basilar apex aneurysm using a 4.5 mm x 30 mm neuroform ATLAS stent  CTA head & neck - stent  from the mid basilar artery into the left P2 segment.   MRI Basilar cistern SAH and small IVH new from yesterday. ? SDH. Scattered acute infarcts B parietal, L occipital, R cerebellar.  CT head L basilar cistern SAH stable. Trace IVH. Patchy hypodensity parietal, L occipital, R cerebellar infarcts.   CT head repeat - Decreasing intraventricular blood products.  2D Echo - EF 60 - 65%. No cardiac source of emboli identified.   LDL 73   HgbA1c 5.5  SCDs for VTE prophylaxis  aspirin 81 mg daily and clopidogrel 75 mg daily prior to admission, now on aspirin 81 mg daily and clopidogrel 75 mg daily.  Continue DAPT on discharge due to stent  Therapy recommendations:  CIR  Disposition:  pending  Basal artery aneurysm   Status post embolization, stenting and repair  Continue DAPT on  discharge  Educated on quit smoking and cocaine  Continue follow-up with Dr. Corliss Skainseveshwar as outpatient  Agitation   Intubated secondary to extreme agitation  extubated 06/27/19  On precedex  CCM on board  Hypertension  Home meds:  verapamil 80 mg tid    BP - stable now . Long-term BP goal normotensive  Dysphagia . Secondary to stroke . Now passed swallow . However, pt felt nausea and lack of appetite . Switch to full liquid diet to help intake . Advance diet as pt condition improves . Speech on board  Cocaine abuse   UDS positive for cocaine  Patient advised to stop using due to stroke risk.  Patient willing to quit  Tobacco abuse  Current smoker  Smoking cessation counseling provided  Pt is willing to quit   Other Stroke Risk Factors  ETOH use, alcohol level <5, advised to drink no more than 2 drink(s) a day  Other Active Problems  Anxiety/depression   GERD  Hypokalemia - 3.3 - supplement and recheck  Hospital day # 3  Neurology will sign off. Please call with questions. [facility]. Thanks for the consult.   Marvel PlanJindong Bridgitt Raggio, MD PhD Stroke Neurology 06/29/2019 5:47 PM   To contact Stroke Continuity provider, please refer to [email or web address]. After hours, contact General Neurology

## 2019-06-29 NOTE — Progress Notes (Signed)
Patient requested his contacts, searched patient belongings and found one contact in saline. Patient insisted on attempting to put it contact and it was dropped in patient's bed.   Candy Sledge, RN

## 2019-06-29 NOTE — Evaluation (Signed)
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 Pt with signs of AMS with compliants of vision loss progressing to severe agititation and combativeness. PMH substance abuse,depression, suicidal ideations, Gerd, HA   Clinical Impression   Patient is s/p R [facility] anigogram for aneurysm surgery resulting in functional limitations due to the deficits listed below (see OT problem list). Pt requires visual cues throughout session and demonstrates balance deficits mod (A). Pt was independent previously working full time for [employer].  Patient will benefit from skilled OT acutely to increase independence and safety with ADLS to allow discharge CIR.     Follow Up Recommendations  CIR    Equipment Recommendations  Other (comment)(TBA)    Recommendations for Other Services Rehab consult     Precautions / Restrictions Precautions Precautions: Fall      Mobility Bed Mobility Overal bed mobility: Needs Assistance Bed Mobility: Supine to Sit     Supine to sit: Min assist     General bed mobility comments: pt sleeping on arrival but once EOB states "yall woke me up . i like yall" pt requires (A) to sequence task and extended sitting   Transfers Overall transfer level: Needs assistance Equipment used: Rolling walker (2 wheeled) Transfers: Sit to/from Stand Sit to Stand: +2 physical assistance;Mod assist         General transfer comment: first attempt required two people for safety. pt attempting to sit back down then attempting to move to chair when given cues to move to bathroom level sink. visual deficits and cognitive deficits with this initial task making it difficult for patient    Balance Overall balance assessment: Needs assistance Sitting-balance support: Bilateral upper  extremity supported;Feet supported Sitting balance-Leahy Scale: Fair     Standing balance support: Single extremity supported;During functional activity Standing balance-Leahy Scale: Fair                             ADL either performed or assessed with clinical judgement   ADL Overall ADL's : Needs assistance/impaired Eating/Feeding: Set up;Sitting Eating/Feeding Details (indicate cue type and reason): requested icecream. pt only seeing container in L visual field. pt finding container on R by chance moving hand. PT states "man my vision is messed up" Grooming: Wash/dry hands;Wash/dry face;Oral care;Maximal assistance;Standing Grooming Details (indicate cue type and reason): unable to turn water and off. pt requesting tooth paste and locating soap container. pt holding it up and states "is this it" Ot states no its on your right. Pt placing back down on container feeling around with hands and pick up soap again. This it? Pt required hand over hand to reach R visual field             Lower Body Dressing: Moderate assistance Lower Body Dressing Details (indicate cue type and reason): able to don socks at EOB with figure 4 Toilet Transfer: Moderate assistance;RW Toilet Transfer Details (indicate cue type and reason): simulated EOB to chair         Functional mobility during ADLs: Moderate assistance;Rolling walker General ADL Comments: pt with visual deficits affecting all adls.      Vision Baseline Vision/History: Wears glasses Wears Glasses: At all times Vision Assessment?: Vision impaired- to be further tested in functional context Additional Comments: pt locating  objects in L visual field deficits . Pt with peripheral deficits in both eyes. pt reports wearing contacts at baseline and without them currently. pt reports having trouble seeing at distance but not up close reading.      Perception Perception Perception Tested?: Yes Perception Deficits:  Inattention/neglect;Color recognition Spatial deficits: pt reports Yellow as orange Comments: need to further assess vision   Praxis      Pertinent Vitals/Pain Pain Assessment: No/denies pain     Hand Dominance Right   Extremity/Trunk Assessment Upper Extremity Assessment Upper Extremity Assessment: Overall WFL for tasks assessed   Lower Extremity Assessment Lower Extremity Assessment: Defer to PT evaluation   Cervical / Trunk Assessment Cervical / Trunk Assessment: Normal   Communication Communication Communication: No difficulties   Cognition Arousal/Alertness: Awake/alert Behavior During Therapy: Restless Overall Cognitive Status: Impaired/Different from baseline Area of Impairment: Attention;Following commands;Safety/judgement;Awareness;Problem solving                   Current Attention Level: Sustained   Following Commands: Follows one step commands with increased time Safety/Judgement: Decreased awareness of safety;Decreased awareness of deficits Awareness: Intellectual Problem Solving: Slow processing;Difficulty sequencing General Comments: pt reports today as Dec 21. when educated yesterday was [place]ristmas pt states "oh its 26th. REally ? its the 26th?" Pt requires redirection to bed and unable to correctly idenify R from L throughout session.    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 Pt must manage 20 calls in 4 hours      Prior Functioning/Environment Level of Independence: Independent                 OT Problem List: Decreased strength;Decreased activity tolerance;Impaired balance (sitting and/or standing);Impaired vision/perception;Decreased coordination;Decreased cognition;Decreased safety awareness;Decreased knowledge of use of DME or AE;Decreased knowledge of precautions      OT Treatment/Interventions: Self-care/ADL training;Therapeutic exercise;Neuromuscular education;DME and/or AE instruction;Therapeutic activities;Cognitive remediation/compensation;Visual/perceptual remediation/compensation    OT Goals(Current goals can be found in the care plan section) Acute Rehab OT Goals Patient Stated Goal: to get ice cream OT Goal Formulation: Patient unable to participate in goal setting Time For Goal Achievement: 07/13/19 Potential to Achieve Goals: Good  OT Frequency: Min 3X/week   Barriers to D/C: Other (comment)(reports significant other can help)  no name given only my significant other can help and doesnt work then a giggle when asked if they are physically able to help. PT states "not really but yeah"       Co-evaluation PT/OT/SLP Co-Evaluation/Treatment: Yes Reason for Co-Treatment: For patient/therapist safety;To address functional/ADL transfers   OT goals addressed during session: ADL's and self-care      AM-PAC OT "6 Clicks" Daily Activity     Outcome Measure Help from another person eating meals?: A Little Help from another person taking care of personal grooming?: A Lot Help from another person toileting, which includes using toliet, bedpan, or urinal?: A Lot Help  from another person bathing (including washing, rinsing, drying)?: A Lot Help from another person to put on and taking off regular upper body clothing?: A Lot Help from another person to put on and taking off regular lower body clothing?: A  Lot 6 Click Score: 13   End of Session Equipment Utilized During Treatment: Gait belt;Rolling walker Nurse Communication: Mobility status;Precautions  Activity Tolerance: Patient tolerated treatment well Patient left: in chair;with call bell/phone within reach;with chair alarm set  OT Visit Diagnosis: Unsteadiness on feet (R26.81);Muscle weakness (generalized) (M62.81)                Time: [id number] OT Time Calculation (min): 34 min Charges:  OT General Charges $OT Visit: 1 Visit OT Evaluation $OT Eval Moderate Complexity: 1 Mod   Brynn, OTR/L  Acute Rehabilitation Services Pager: [phone] Office: [phone] .   Mateo FlowBrynn Lasondra Hodgkins 06/29/2019, 1:44 PM

## 2019-06-30 LAB — CBC
HCT: 36.2 % — ABNORMAL LOW (ref 39.0–52.0)
Hemoglobin: 12.1 g/dL — ABNORMAL LOW (ref 13.0–17.0)
MCH: 27.9 pg (ref 26.0–34.0)
MCHC: 33.4 g/dL (ref 30.0–36.0)
MCV: 83.6 fL (ref 80.0–100.0)
Platelets: 95 10*3/uL — ABNORMAL LOW (ref 150–400)
RBC: 4.33 MIL/uL (ref 4.22–5.81)
RDW: 11.9 % (ref 11.5–15.5)
WBC: 5.1 10*3/uL (ref 4.0–10.5)
nRBC: 0 % (ref 0.0–0.2)

## 2019-06-30 LAB — BASIC METABOLIC PANEL
Anion gap: 5 (ref 5–15)
BUN: 5 mg/dL — ABNORMAL LOW (ref 6–20)
CO2: 23 mmol/L (ref 22–32)
Calcium: 8.3 mg/dL — ABNORMAL LOW (ref 8.9–10.3)
Chloride: 110 mmol/L (ref 98–111)
Creatinine, Ser: 0.77 mg/dL (ref 0.61–1.24)
GFR calc Af Amer: >60 mL/min (ref >60)
GFR calc non Af Amer: >60 mL/min (ref >60)
Glucose, Bld: 119 mg/dL — ABNORMAL HIGH (ref 70–99)
Potassium: 3.8 mmol/L (ref 3.5–5.1)
Sodium: 138 mmol/L (ref 135–145)

## 2019-06-30 NOTE — Progress Notes (Signed)
Patient expressing intention to leave the hospital today, has been counseled at length by this RN as well as providers from both [facility] and [facility]. Patient expresses understanding that he is at risk for adverse events such as vasospasm and that without immediate medical intervention that could result in death. Attempted to call patient's cousin to pick patient up with no answer.  Candy Sledge, RN

## 2019-06-30 NOTE — Progress Notes (Signed)
Patient wishes to have a "real" breakfast this morning. I contacted [facility] to see if we could change patient's diet to regular. I informed them that patient is neuro intact, and swallowing liquids fine, and had no episodes of nausea or vomiting during night shift. I was told by [facility] RN that they don't have time for that at the moment. Will continue to monitor.

## 2019-06-30 NOTE — Progress Notes (Signed)
Patient's cousin Bonnita Nasuti coming to pick up patient, IV removed and AMA documentation signed. Patient removed from all hospital equipment and personal items returned.  Candy Sledge, RN

## 2019-06-30 NOTE — Progress Notes (Addendum)
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 Breakfast tray at bedside with a few bites of toast taken out, reports that he does not like the taste of the hospital food so does not want to eat anymore. He denies any n/v in the last 24H. Reports good sleep last night and headache improved from yesterday. He states his contacts were lost by staff so he cannot see very far, he plans to get new contacts and glasses after he is discharged. He states he is leaving AMA regardless today because he "just wants to be home" and does not wish to discuss remaining inpatient any further. He reports he spoke with his mother and grandmother last night who both told him to stay in the hospital until he is cleared to go home, however he states he just does not want to stay any longer.   Allergies: Patient has no known allergies.  Medications: Prior to Admission medications   Medication Sig Start Date End Date Taking? Authorizing Provider  aspirin 81 MG chewable tablet Chew 81 mg by mouth daily.   Yes [provider]  clopidogrel (PLAVIX) 75 MG tablet Take 75 mg by mouth daily.    Yes [provider]  ibuprofen (ADVIL) 200 MG tablet Take 600 mg by mouth every 6 (six) hours as needed for headache.   Yes [provider]  verapamil (CALAN) 80 MG tablet Take 1 tablet (80 mg total) by mouth 3 (three) times daily. Patient not taking: Reported on 06/28/2019 02/20/19   Pieter Partridge, DO     Vital Signs: BP 126/76    Pulse (!) 59    Temp 98 F (36.7 C) (Oral)    Resp 13    Ht 5\' 11"  (1.803 m)    Wt 163 lb (73.9 kg)    SpO2 97%    BMI 22.73 kg/m   Physical Exam Vitals and nursing note reviewed.    Constitutional:      General: He is not in acute distress. HENT:     Head: Normocephalic.  Cardiovascular:     Rate and Rhythm: Normal rate.  Pulmonary:     Effort: Pulmonary effort is normal.  Skin:    General: Skin is warm and dry.  Neurological:     Mental Status: He is alert.   Alert, awake, and oriented x4 Speech and comprehension in tact - able to state "2 fingers, thumb, right hand" appropriately; slurring noted yesterday appears to have improved as well.  PERRL bilaterally EOMs without nystagmus or subjective diplopia. Visual fields - intact except for RLQ of right peripheral vision. Cannot assess distance vision as patient without contacts/glasses which he requires normally - cannot see clock, calendar or TV.  No facial asymmetry. Tongue midline Motor power full bilaterally Negative pronator drift. Fine motor and coordination in tact bilaterally Gait not assessed Romberg not assessed Heel to toe not assessed Distal pulses not assessed   Imaging: EEG  Result Date: 06/26/2019 Lora Havens, MD     06/26/2019  4:45 PM Patient Name: Juanmanuel Marohl MRN: [id number] Epilepsy Attending: Lora Havens Referring Physician/Provider: Etta Quill, PA Date: 06/26/2019 Duration: 22.25 mins Patient history: 28 year old male post embolization of basilar apex aneurysm with new onset bilateral visual  loss and aphasia.  EEG to evaluate for seizures. Level of alertness: Awake AEDs during EEG study: None Technical aspects: This EEG study was done with scalp electrodes positioned according to the 10-20 International system of electrode placement. Electrical activity was acquired at a sampling rate of  and reviewed with a high frequency filter of  and a low frequency filter of . EEG data were recorded continuously and digitally stored. Description: EEG was technically difficult due to significant myogenic artifact. No clear posterior dominant rhythm was seen. EEG showed  continuous generalized 3-5 hz theta-delta slowing. Hyperventilation and photic stimulation were not performed. Abnormality - Continuous slow, generalized IMPRESSION: This technically difficult study is suggestive of moderate diffuse encephalopathy, non specific to etiology. No definite seizures or epileptiform discharges were seen throughout the recording. Priyanka Annabelle Harman   CT ANGIO HEAD W OR WO CONTRAST  Result Date: 06/26/2019 CLINICAL DATA:  Loss of vision after neuro interventional procedure. Placement neuroform atlas stent across a wide necked basilar tip aneurysm. EXAM: CT ANGIOGRAPHY HEAD AND NECK TECHNIQUE: Multidetector CT imaging of the head and neck was performed using the standard protocol during bolus administration of intravenous contrast. Multiplanar CT image reconstructions and MIPs were obtained to evaluate the vascular anatomy. Carotid stenosis measurements (when applicable) are obtained utilizing NASCET criteria, using the distal internal carotid diameter as the denominator. CONTRAST:  75mL OMNIPAQUE IOHEXOL 350 MG/ML SOLN COMPARISON:  None. FINDINGS: CTA NECK FINDINGS Aortic arch: A 3 vessel arch configuration is present. No significant atherosclerotic changes are present. No aneurysm or stenosis. Right carotid system: The right common carotid artery is within normal limits. Bifurcation is unremarkable. Cervical right ICA is normal. Left carotid system: The left common carotid artery is within normal limits. Bifurcation is unremarkable. The cervical left ICA is normal. Vertebral arteries: The vertebral arteries are codominant. There is no significant stenosis in either vertebral artery in the neck. Vertebral arteries originate the subclavian arteries without significant stenosis. Skeleton: Vertebral body heights alignment are maintained. Focal lytic or blastic lesions are present. Other neck: Soft tissues the neck are otherwise unremarkable. Upper chest: Lung apices are clear.  Thoracic  inlet is normal. Review of the MIP images confirms the above findings CTA HEAD FINDINGS Anterior circulation: The internal carotid arteries are within normal limits through the ICA termini bilaterally. 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 Both A2 segments fill from right. M1 segments are normal bilaterally. MCA bifurcations are normal. ACA and MCA branch vessels are within normal limits. No significant proximal stenosis or occlusion is present. Posterior circulation: The vertebral arteries are codominant. PICA origins are visualized and normal. Vertebrobasilar junction is normal. Stent is visualized from the distal basilar artery into the left P2 segment. There is decreased caliber of the vessel just distal to the stent. The appearance is likely exaggerated due to artifact. Distal branch vessels fill in the left PCA branches. Right PCA branches are within normal limits. Venous sinuses: The dural sinuses are patent. The straight sinus and deep cerebral veins are intact. Cortical veins are within normal limits. No vascular malformation is evident. Anatomic variants: None Review of the MIP images confirms the above findings IMPRESSION: 1. Arterial stent from the mid basilar artery into the left P2 segment. 2. Decreased caliber of the left P2 segment just beyond the stent is likely exaggerated by artifact. 3. No distal branch vessel occlusion to explain visual loss. 4. No occlusive disease involving the right PCA branch vessels. 5. No acute infarct is evident. 6. Normal CTA  of the neck. These results were called by telephone at the time of interpretation on 06/26/2019 at 2:26 pm to provider [facility], who verbally acknowledged these results. Electronically Signed   By: Marin Roberts M.D.   On: 06/26/2019 14:40   CT HEAD WO CONTRAST  Result Date: 06/28/2019 CLINICAL DATA:  Nausea and vomiting. Subarachnoid hemorrhage. EXAM: CT HEAD WITHOUT CONTRAST TECHNIQUE: Contiguous axial images were obtained from the  base of the skull through the vertex without intravenous contrast. COMPARISON:  MR head without contrast 06/27/2019. CTA head and neck 07/26/2018 FINDINGS: Brain: Fluid levels in the ventricles are less well seen than prior exam. 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 Infarct along medial aspect of the left occipital lobe noted. A right PICA territory infarct is noted. There is expected increasing hypoattenuation within each of these infarcts. No new infarcts are present. There is no expansion of suspected hemorrhage. Stent is noted. Anterior circulation territories are within limits. Vascular: Basilar artery to left PCA stent is again noted. No vascular calcifications are present. Skull: Calvarium is intact. No focal lytic or blastic lesions are present. No significant extracranial soft tissue lesion is present. Sinuses/Orbits: The paranasal sinuses and mastoid air cells are clear. The globes and orbits are within normal limits. IMPRESSION: 1. 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 2. Expected evolution of right PICA territory infarct. 3. No new infarcts. 4. Stable appearance of distal basilar artery to left PCA stent. 5. Decreasing intraventricular blood products. 6. No new hemorrhage. Electronically Signed   By: Marin Roberts M.D.   On: 06/28/2019 14:44   CT HEAD WO CONTRAST  Result Date: 06/27/2019 CLINICAL DATA:  28 year old male status post basilar tip aneurysm stenting with loss of vision. Follow-up MRI reveals evidence of basilar subarachnoid hemorrhage and IVH. EXAM: CT HEAD WITHOUT CONTRAST TECHNIQUE: Contiguous axial images were obtained from the base of the skull through the vertex without intravenous contrast. COMPARISON:  Brain MRI 0335 hours today and earlier. FINDINGS: Brain: Small volume intraventricular hemorrhage in the occipital horns and 4th ventricle, also visible by MRI. Basilar cistern subarachnoid hemorrhage primarily on the  left: Interpeduncular, ambient cistern, prepontine, left CP angle, and pre medullary. The CT appearance is less convincing for subdural hematoma at the cisterna magna. Stable ventricle size, no ventriculomegaly. Patchy somewhat subtle hypodensity in the posterior parietal lobes, left occipital lobe and right cerebellum corresponding to the acute infarcts by MRI. No parenchymal hemorrhage. No significant intracranial mass effect at this time. Gray-white matter differentiation elsewhere remains within normal limits. Vascular: Basilar artery through left PCA vascular stent is evident. Skull: No acute osseous abnormality identified. Sinuses/Orbits: Visualized paranasal sinuses and mastoids are stable and well pneumatized. Other: Intubated. Fluid in the pharynx. No acute orbit or scalp soft tissue finding. IMPRESSION: 1. Basilar cistern subarachnoid hemorrhage is primarily on the left and stable from the MRI earlier today. No superimposed subdural hematoma suspected. No intra-axial blood identified. 2. Trace associated IVH. No ventriculomegaly or significant intracranial mass effect at this time. 3. Patchy hypodensity associated with the acute parietal, left occipital, and right cerebellar infarcts. Electronically Signed   By: Odessa Fleming M.D.   On: 06/27/2019 07:21   CT ANGIO NECK W OR WO CONTRAST  Result Date: 06/26/2019 CLINICAL DATA:  Loss of vision after neuro interventional procedure. Placement neuroform atlas stent across a wide necked basilar tip aneurysm. EXAM: CT ANGIOGRAPHY HEAD AND NECK TECHNIQUE: Multidetector CT imaging of the head and neck was performed using the standard  protocol during bolus administration of intravenous contrast. Multiplanar CT image reconstructions and MIPs were obtained to evaluate the vascular anatomy. Carotid stenosis measurements (when applicable) are obtained utilizing NASCET criteria, using the distal internal carotid diameter as the denominator. CONTRAST:  75mL OMNIPAQUE  IOHEXOL 350 MG/ML SOLN COMPARISON:  None. FINDINGS: CTA NECK FINDINGS Aortic arch: A 3 vessel arch configuration is present. No significant atherosclerotic changes are present. No aneurysm or stenosis. Right carotid system: The right common carotid artery is within normal limits. Bifurcation is unremarkable. Cervical right ICA is normal. Left carotid system: The left common carotid artery is within normal limits. Bifurcation is unremarkable. The cervical left ICA is normal. Vertebral arteries: The vertebral arteries are codominant. There is no significant stenosis in either vertebral artery in the neck. Vertebral arteries originate the subclavian arteries without significant stenosis. Skeleton: Vertebral body heights alignment are maintained. Focal lytic or blastic lesions are present. Other neck: Soft tissues the neck are otherwise unremarkable. Upper chest: Lung apices are clear.  Thoracic inlet is normal. Review of the MIP images confirms the above findings CTA HEAD FINDINGS Anterior circulation: The internal carotid arteries are within normal limits through the ICA termini bilaterally. 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 Both A2 segments fill from right. M1 segments are normal bilaterally. MCA bifurcations are normal. ACA and MCA branch vessels are within normal limits. No significant proximal stenosis or occlusion is present. Posterior circulation: The vertebral arteries are codominant. PICA origins are visualized and normal. Vertebrobasilar junction is normal. Stent is visualized from the distal basilar artery into the left P2 segment. There is decreased caliber of the vessel just distal to the stent. The appearance is likely exaggerated due to artifact. Distal branch vessels fill in the left PCA branches. Right PCA branches are within normal limits. Venous sinuses: The dural sinuses are patent. The straight sinus and deep cerebral veins are intact. Cortical veins are within normal limits. No vascular  malformation is evident. Anatomic variants: None Review of the MIP images confirms the above findings IMPRESSION: 1. Arterial stent from the mid basilar artery into the left P2 segment. 2. Decreased caliber of the left P2 segment just beyond the stent is likely exaggerated by artifact. 3. No distal branch vessel occlusion to explain visual loss. 4. No occlusive disease involving the right PCA branch vessels. 5. No acute infarct is evident. 6. Normal CTA of the neck. These results were called by telephone at the time of interpretation on 06/26/2019 at 2:26 pm [facility], who verbally acknowledged these results. Electronically Signed   By: Marin Robertshristopher  Mattern M.D.   On: 06/26/2019 14:40   MR BRAIN WO CONTRAST  Addendum Date: 06/27/2019   ADDENDUM REPORT: 06/27/2019 06:27 ADDENDUM: Study discussed by telephone with Dr. Caryl PinaEric Lindzen on 06/27/2019 at 0616 hours. Electronically Signed   By: Odessa FlemingH  Hall M.D.   On: 06/27/2019 06:27   Result Date: 06/27/2019 CLINICAL DATA:  28 year old male with loss of vision after NIR procedure, stent placed across basilar tip aneurysm. EXAM: MRI HEAD WITHOUT CONTRAST TECHNIQUE: Multiplanar, multiecho pulse sequences of the brain and surrounding structures were obtained without intravenous contrast. COMPARISON:  CTA head and neck yesterday.  Brain MRI 02/22/2019. FINDINGS: Brain: There is new high FLAIR signal and intermediate T1 signal in the basilar cisterns and posterior fossa suspicious for subarachnoid hemorrhage, including in the cisterna magna. Component of subdural blood is felt less likely. And there is associated intraventricular hemorrhage layering in the occipital horns, also the 4th ventricle.  No superimposed ventriculomegaly. No midline shift or significant intracranial mass effect. Patchy areas of cortical and white matter restricted diffusion in both posterior parietal lobes, superior occipital lobes, the left occipital pole, left lateral occipital  lobe, and a small area of the anterior and medial occipital lobe. Superimposed similar patchy restricted diffusion in the right cerebellum including the right tonsil. Cytotoxic edema with no parenchymal blood identified. No definite superimposed brainstem restricted diffusion. No thalamic restricted diffusion. No anterior circulation restricted diffusion identified. Stable gray and white matter signal in the anterior circulation. Vascular: Major intracranial vascular flow voids appear stable compared to August. Basilar through left PCA stent better demonstrated by CTA. Skull and upper cervical spine: No definite hemorrhage in the visible upper cervical spine. Bone marrow signal remains normal. Sinuses/Orbits: Intubated. Fluid in the pharynx. Paranasal sinuses remain well pneumatized. Negative orbits. Other: Mastoids remain clear. IMPRESSION: 1. Basilar cistern subarachnoid hemorrhage and small volume intraventricular hemorrhage appears new from the CTA yesterday. Difficult to exclude a component of subdural hematoma at the foramen magnum. 2. No ventriculomegaly or significant intracranial mass effect at this time. 3. 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 No brainstem or anterior circulation involvement identified. No intra-axial hemorrhage identified. 4. Major intracranial vascular flow voids appear stable. Dr. Otelia Limes with Neurology was paged via AMION regarding the findings this exam on 06/27/2019 at 0553 hours. Electronically Signed: By: Odessa Fleming M.D. On: 06/27/2019 06:01   DG Chest Port 1 View  Result Date: 06/27/2019 CLINICAL DATA:  Endotracheal tube.  Recent aneurysm embolization. EXAM: PORTABLE CHEST 1 VIEW COMPARISON:  None. FINDINGS: Endotracheal tube has tip 7.8 cm above the carina. Lungs are adequately inflated without focal airspace consolidation or effusion. Cardiomediastinal silhouette is within normal. Remaining bones and soft tissues are  normal. IMPRESSION: No active disease. Endotracheal tube with tip 7.8 cm above the carina. Electronically Signed   By: Elberta Fortis M.D.   On: 06/27/2019 07:10   ECHOCARDIOGRAM COMPLETE  Result Date: 06/28/2019   ECHOCARDIOGRAM REPORT   Patient Name:   [facility] Date of Exam: 06/28/2019 Medical Rec #:  [id number]       Height:       71.0 in Accession #:    [id number]      Weight:       163.0 lb Date of Birth:  [birth year]      BSA:          1.93 m Patient Age:    28 years        BP:           108/81 mmHg Patient Gender: M               HR:           75 bpm. Exam Location:  Inpatient Procedure: 2D Echo Indications:    Stroke  History:        Patient has no prior history of Echocardiogram examinations.  Sonographer:    Thurman Coyer RDCS (AE) Referring Phys: [id number] SHARON L BIBY IMPRESSIONS  1. Left ventricular ejection fraction, by visual estimation, is 60 to 65%. The left ventricle has normal function. There is no left ventricular hypertrophy.  2. The left ventricle has no regional wall motion abnormalities.  3. Global right ventricle has normal systolic function.The right ventricular size is normal. No increase in right ventricular wall thickness.  4. Left atrial size was normal.  5. Right atrial size was normal.  6.  The mitral valve is normal in structure. Mild mitral valve regurgitation.  7. The tricuspid valve is normal in structure.  8. The aortic valve is tricuspid. Aortic valve regurgitation is not visualized. No evidence of aortic valve sclerosis or stenosis.  9. The pulmonic valve was normal in structure. Pulmonic valve regurgitation is not visualized. 10. The inferior vena cava is normal in size with greater than 50% respiratory variability, suggesting right atrial pressure of 3 mmHg. FINDINGS  Left Ventricle: Left ventricular ejection fraction, by visual estimation, is 60 to 65%. The left ventricle has normal function. The left ventricle has no regional wall motion abnormalities. The left  ventricular internal cavity size was the left ventricle is normal in size. There is no left ventricular hypertrophy. Left ventricular diastolic parameters were normal. Right Ventricle: The right ventricular size is normal. No increase in right ventricular wall thickness. Global RV systolic function is has normal systolic function. Left Atrium: Left atrial size was normal in size. Right Atrium: Right atrial size was normal in size Pericardium: There is no evidence of pericardial effusion. Mitral Valve: The mitral valve is normal in structure. Mild mitral valve regurgitation. Tricuspid Valve: The tricuspid valve is normal in structure. Tricuspid valve regurgitation is trivial. Aortic Valve: The aortic valve is tricuspid. Aortic valve regurgitation is not visualized. The aortic valve is structurally normal, with no evidence of sclerosis or stenosis. Pulmonic Valve: The pulmonic valve was normal in structure. Pulmonic valve regurgitation is not visualized. Pulmonic regurgitation is not visualized. Aorta: The aortic root is normal in size and structure. Venous: The inferior vena cava is normal in size with greater than 50% respiratory variability, suggesting right atrial pressure of 3 mmHg. IAS/Shunts: No atrial level shunt detected by color flow Doppler.  LEFT VENTRICLE PLAX 2D LVIDd:         5.40 cm  Diastology LVIDs:         3.50 cm  LV e' lateral:   11.60 cm/s LV PW:         0.80 cm  LV E/e' lateral: 7.9 LV IVS:        0.70 cm  LV e' medial:    11.70 cm/s LVOT diam:     2.10 cm  LV E/e' medial:  7.9 LV SV:         90 ml LV SV Index:   47.00 LVOT Area:     3.46 cm  RIGHT VENTRICLE RV S prime:     17.30 cm/s TAPSE (M-mode): 2.7 cm LEFT ATRIUM             Index       RIGHT ATRIUM           Index LA diam:        3.70 cm 1.91 cm/m  RA Area:     15.20 cm LA Vol (A2C):   53.5 ml 27.68 ml/m RA Volume:   38.50 ml  19.92 ml/m LA Vol (A4C):   44.8 ml 23.18 ml/m LA Biplane Vol: 49.0 ml 25.35 ml/m  AORTIC VALVE LVOT Vmax:    110.00 cm/s LVOT Vmean:  66.200 cm/s LVOT VTI:    0.192 m  AORTA Ao Root diam: 3.10 cm MITRAL VALVE MV Area (PHT): 3.23 cm             SHUNTS MV PHT:        68.15 msec           Systemic VTI:  0.19 m MV Decel Time: 235 msec  Systemic Diam: 2.10 cm MV E velocity: 92.00 cm/s 103 cm/s MV A velocity: 64.90 cm/s 70.3 cm/s MV E/A ratio:  1.42       1.5  Prentice Docker MD Electronically signed by Prentice Docker MD Signature Date/Time: 06/28/2019/4:11:29 PM    Final     Labs:  CBC: Recent Labs    06/27/19 0252 06/28/19 0501 06/29/19 0548 06/30/19 0655  WBC 9.7 5.5 5.3 5.1  HGB 12.8* 13.4 11.6* 12.1*  HCT 38.3* 40.8 34.9* 36.2*  PLT 103* 88* 89* 95*    COAGS: Recent Labs    03/05/19 1110 06/26/19 0635  INR 1.2 1.1    BMP: Recent Labs    06/27/19 0252 06/28/19 0501 06/29/19 0548 06/30/19 0655  NA 137 140 140 138  K 3.9 3.6 3.3* 3.8  CL 104 104 106 110  CO2 23 22 21* 23  GLUCOSE 96 86 81 119*  BUN 5* 7 6 5*  CALCIUM 8.6* 8.5* 7.8* 8.3*  CREATININE 0.71 0.81 0.86 0.77  GFRNONAA >60 >60 >60 >60  GFRAA >60 >60 >60 >60    LIVER FUNCTION TESTS: Recent Labs    06/29/19 0548  BILITOT 1.1  AST 30  ALT 21  ALKPHOS 38  PROT 7.2  ALBUMIN 2.9*    Assessment and Plan:  28 y/o M with 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 Corliss Skains and post procedure basilar cistern subarachnoid hemorrhage and small volume intraventricular hemorrhage seen today for routine follow up.  Neuro exam has continued to improve - speech appears to be near baseline today, still some visual deficits in the RLQ of the right eye however all other visual fields in tact on exam. HA has improved, no n/v in last 24H, tolerates some PO intake but does not like taste of hospital food so is eating minimal amounts currently. AM labs stable - hgb 12.1, plt 95, creatinine 0.77.   Again discussed results of CT from yesterday showing no new hemorrhage and improving  previous hemorrhages, however blood products remain and could potentially cause further complications including stroke and death which is why he is still in the hospital. We discussed in great detail that leaving AMA at this time is very dangerous and could potentially be fatal should he have complications at home. It was strongly recommended several times that patient discuss this again with his family and reconsider desire to leave AMA. Patient again states he is leaving today no matter what, he appears to have appropriate insight into his medical condition(s) and confirms several times that he wishes to leave AGAINST MEDICAL ADVICE. He confirms he is aware of the risks of leaving AMA including but not limited to seizure, stroke, further hemorrhage or death.   Will continue to encourage patient to remain in the hospital - staff to continue to monitor for s/s of bleeding, OOB as tolerated, encourage PO intake.  Please call NIR with questions or concerns.   Electronically Signed: Villa Herb, PA-C 06/30/2019, 9:56 AM   I spent a total of 25 Minutes at the the patient's bedside AND on the patient's hospital floor or unit, greater than 50% of which was counseling/coordinating care for follow up aneurysm embolization and post procedure SAH/ICH.

## 2019-06-30 NOTE — Progress Notes (Addendum)
NAME:  Jacob Holder, MRN:  [id number], DOB:  1991-01-16, LOS: 4 ADMISSION DATE:  06/26/2019, CONSULTATION DATE:  12/23 REFERRING MD:  Dr. 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  Patient began displaying signs of altered mental status with complaints of vision loss that progressively worsened to severe agitation and combativeness.  PCCM consulted for further management.  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 Per IR team has been having difficulty getting procedure scheduled due to transportation issues and medication compliance.  Patient has been experiencing headaches with intermittent nausea prior to admission.  Denies any vision changes or other neurological symptoms.  Patient successfully underwent angiogram embolization of basilar aneurysm with Dr. Estanislado Pandy 12/23 without any acute complications.  However on repeat evaluation in PACU patient began experiencing vision changes stating "I cannot see" it appears that around the same time patient became confused with mild agitation.  Agitation quickly worsened and patient became significantly combative.  Neurology consulted and recommended obtaining MRI of the brain but due to combativeness unable to obtain.  Therefore decision was made to administer deep sedation to ensure compliance with imaging and  patient was intubated for airway protection prior to sedation.  PCCM consulted for further management  Past Medical History  Depression Prior suicidal ideations Basal artery aneurysm embolization GERD Headache  Significant Hospital Events   12/23 admitted for elective basilar artery aneurysm embolization 12/24 Extubated 12/25 Weaned off  precedex  Consults:  Neurology PCCM  Procedures:  12/23 basilar artery aneurysm embolization 12/23 intubated  12/24 extubated  Significant Diagnostic Tests:  CT angio head and neck 12/23 >  1. Arterial stent from the mid basilar artery into the left P2 segment. 2. Decreased caliber of the left P2 segment just beyond the stent is likely exaggerated by artifact. 3. No distal branch vessel occlusion to explain visual loss. 4. No occlusive disease involving the right PCA branch vessels. 5. No acute infarct is evident. 6. Normal CTA of the neck.  MRI 06/27/19> 1. Basilar cistern subarachnoid hemorrhage and small volume intraventricular hemorrhage appears new from the CTA yesterday. Difficult to exclude a component of subdural hematoma at the foramen magnum.  2. No ventriculomegaly or significant intracranial mass effect at this time.  3. 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 No brainstem or anterior circulation involvement identified. No intra-axial hemorrhage identified.  4. Major intracranial vascular flow voids appear stable.  Micro Data:  MRSA PCR 12/23 > Negative   Antimicrobials:   Ancef periop x1  Interim history/subjective:  Says he slept well last night Wants to leave AMA   Objective   Blood pressure (!) 177/83, pulse 70, temperature 98 F (36.7 C), temperature source Oral, resp. rate (!) 22, height 5\' 11"  (1.803 m), weight 73.9 kg, SpO2 98 %.        Intake/Output Summary (Last 24 hours) at 06/30/2019 0842 Last data filed at 06/30/2019 0700 Gross per 24 hour  Intake 2341.26 ml  Output --  Net 2341.26 ml   Filed Weights   06/26/19 0725  Weight: 73.9 kg   Physical Exam: General: WDWN adult M NAD reclined in bed  HENT: NCAt anicteric sclera trachea  midline pink mmm  Respiratory: CTA no accessory use on RA  Cardiovascular: RRR s1s2 no rgm  Extremities: Symmetrical bulk and tone no joint  deformity  Neuro: AAOx4 following commands  Skin: c/d/w/i without rash  Psych:  Appropriate insight. Calm but apathetic affect   Resolved Hospital Problem list   Acute hypoxemic respiratory failure  Assessment & Plan:  28 year old male admitted for embolization of known basilar artery aneurysm. Post-op patient developed confusion, bilateral blindness and aphasia. Due to his agitation he was intubated for airway protection to obtain urgent MRI which demonstrated SAH and scattered embolic infarcts.   PCCM signed off 12/25. 12/26 Primary team asked PCCM to re-engage to restart Precedex and speak with patient about leaving AMA.   Hx polysubstance abuse +cocaine this admission Agitation, improved  Plan -I will discontinue precedex today and plan to sign off again.  -Trazodone qHS for sleep   Acute encephalopathy secondary to [facility], embolic infarcts, polysubstance withdrawal +/- delirium -improved Vision partially improving Plan -Neuro has signed off -Speech/PT/OT  -Neuro checks -Goal systolic blood pressure 120-140   Goals of Care: PCCM asked to see again after re-initiation of precedex due to agitation 12/26. Long discussion with pt 12/26 regarding AMA vs staying inpatient. Patient agrees to stay 12/26 if we are able to help him sleep. He states that he will want to leave [facility] 12/27 regardless. I advise that leaving AMA is not recommended and we can further discuss 12/27 if his wishes are to leave AMA at that time.   12/27 patient is debating AMA again. We discussed the risks with leaving AMA. He has appropriate insight and understanding of this risks, is contemplative and will notify primary team when decision reached.  Best practice:  Diet: Reg Pain/Anxiety/Delirium protocol (if indicated): na VAP protocol (if indicated): na DVT prophylaxis: SCDs, heparin drip per neuro-pharmacy consult GI prophylaxis: PPI Glucose control: na Mobility: Bedrest Code Status: Full Family  Communication: Pt updated  Disposition: Fluctuating in desire to leave AMA. Currently in ICU as precedex is back on    Labs   CBC: Recent Labs  Lab 06/26/19 0635 06/26/19 1816 06/27/19 0252 06/28/19 0501 06/29/19 0548 06/30/19 0655  WBC 4.1  --  9.7 5.5 5.3 5.1  NEUTROABS 1.7  --   --   --  3.3  --   HGB 14.8 13.9 12.8* 13.4 11.6* 12.1*  HCT 44.7 41.0 38.3* 40.8 34.9* 36.2*  MCV 86.5  --  84.4 84.8 82.7 83.6  PLT 96*  --  103* 88* 89* 95*    Basic Metabolic Panel: Recent Labs  Lab 06/26/19 0635 06/26/19 1816 06/27/19 0252 06/28/19 0501 06/29/19 0548 06/30/19 0655  NA 137 140 137 140 140 138  K 3.7 3.8 3.9 3.6 3.3* 3.8  CL 103  --  104 104 106 110  CO2 26  --  23 22 21* 23  GLUCOSE 88  --  96 86 81 119*  BUN 7  --  5* 7 6 5*  CREATININE 0.99  --  0.71 0.81 0.86 0.77  CALCIUM 8.8*  --  8.6* 8.5* 7.8* 8.3*  MG  --   --  1.9  --   --   --   PHOS  --   --  2.6  --   --   --    GFR: Estimated Creatinine Clearance: 141.1 mL/min (by C-G formula based on SCr of 0.77 mg/dL). Recent Labs  Lab 06/26/19 1820 06/27/19 0252 06/28/19 0501 06/29/19 4097  06/30/19 0655  PROCALCITON <0.10 0.12  --   --   --   WBC  --  9.7 5.5 5.3 5.1    Liver Function Tests: Recent Labs  Lab 06/29/19 0548  AST 30  ALT 21  ALKPHOS 38  BILITOT 1.1  PROT 7.2  ALBUMIN 2.9*   No results for input(s): LIPASE, AMYLASE in the last 168 hours. Recent Labs  Lab 06/26/19 1820  AMMONIA 34    ABG    Component Value Date/Time   PHART 7.488 (H) 06/26/2019 1816   PCO2ART 37.8 06/26/2019 1816   PO2ART 641.0 (H) 06/26/2019 1816   HCO3 28.7 (H) 06/26/2019 1816   TCO2 30 06/26/2019 1816   O2SAT 100.0 06/26/2019 1816     Coagulation Profile: Recent Labs  Lab 06/26/19 0635  INR 1.1    Cardiac Enzymes: No results for input(s): CKTOTAL, CKMB, CKMBINDEX, TROPONINI in the last 168 hours.  HbA1C: Hgb A1c MFr Bld  Date/Time Value Ref Range Status  06/26/2019 06:20 PM 5.5 4.8 - 5.6 %  Final    Comment:    (NOTE) Pre diabetes:          5.7%-6.4% Diabetes:              >6.4% Glycemic control for   <7.0% adults with diabetes     CBG: Recent Labs  Lab 06/26/19 1609  GLUCAP 108*   Tessie FassGrace Bowser MSN, AGACNP-BC [facility] [phone] If no answer, [phone] 06/30/2019, 8:42 AM   Pt left hospital against medical advice prior to my assessment.  Coralyn HellingVineet Jamonta Goerner, MD [facility] 06/30/2019, 12:17 PM

## 2019-07-01 ENCOUNTER — Other Ambulatory Visit (HOSPITAL_COMMUNITY): Payer: Self-pay | Admitting: Interventional Radiology

## 2019-07-01 ENCOUNTER — Ambulatory Visit (HOSPITAL_COMMUNITY): Payer: Managed Care, Other (non HMO)

## 2019-07-01 DIAGNOSIS — I671 Cerebral aneurysm, nonruptured: Secondary | ICD-10-CM

## 2019-07-02 ENCOUNTER — Telehealth (HOSPITAL_COMMUNITY): Payer: Self-pay | Admitting: Radiology

## 2019-07-02 ENCOUNTER — Emergency Department (HOSPITAL_COMMUNITY)
Admission: EM | Admit: 2019-07-02 | Discharge: 2019-07-03 | Discharge status: Against Medical Advice | Payer: Managed Care, Other (non HMO) | Source: Home / Self Care

## 2019-07-02 ENCOUNTER — Emergency Department (HOSPITAL_COMMUNITY): Payer: Managed Care, Other (non HMO)

## 2019-07-02 ENCOUNTER — Other Ambulatory Visit: Payer: Self-pay

## 2019-07-02 ENCOUNTER — Encounter (HOSPITAL_COMMUNITY): Payer: Self-pay

## 2019-07-02 DIAGNOSIS — I639 Cerebral infarction, unspecified: Secondary | ICD-10-CM | POA: Diagnosis not present

## 2019-07-02 DIAGNOSIS — I615 Nontraumatic intracerebral hemorrhage, intraventricular: Secondary | ICD-10-CM | POA: Diagnosis not present

## 2019-07-02 LAB — DIFFERENTIAL
Abs Immature Granulocytes: 0.01 10*3/uL (ref 0.00–0.07)
Basophils Absolute: 0 10*3/uL (ref 0.0–0.1)
Basophils Relative: 1 %
Eosinophils Absolute: 0.1 10*3/uL (ref 0.0–0.5)
Eosinophils Relative: 2 %
Immature Granulocytes: 0 %
Lymphocytes Relative: 30 %
Lymphs Abs: 1.3 10*3/uL (ref 0.7–4.0)
Monocytes Absolute: 0.4 10*3/uL (ref 0.1–1.0)
Monocytes Relative: 10 %
Neutro Abs: 2.5 10*3/uL (ref 1.7–7.7)
Neutrophils Relative %: 57 %

## 2019-07-02 LAB — CBC
HCT: 38.4 % — ABNORMAL LOW (ref 39.0–52.0)
Hemoglobin: 12.4 g/dL — ABNORMAL LOW (ref 13.0–17.0)
MCH: 28.1 pg (ref 26.0–34.0)
MCHC: 32.3 g/dL (ref 30.0–36.0)
MCV: 86.9 fL (ref 80.0–100.0)
Platelets: 125 10*3/uL — ABNORMAL LOW (ref 150–400)
RBC: 4.42 MIL/uL (ref 4.22–5.81)
RDW: 12.4 % (ref 11.5–15.5)
WBC: 4.4 10*3/uL (ref 4.0–10.5)
nRBC: 0 % (ref 0.0–0.2)

## 2019-07-02 LAB — COMPREHENSIVE METABOLIC PANEL
ALT: 49 U/L — ABNORMAL HIGH (ref 0–44)
AST: 45 U/L — ABNORMAL HIGH (ref 15–41)
Albumin: 3.4 g/dL — ABNORMAL LOW (ref 3.5–5.0)
Alkaline Phosphatase: 55 U/L (ref 38–126)
Anion gap: 7 (ref 5–15)
BUN: <5 mg/dL — ABNORMAL LOW (ref 6–20)
CO2: 28 mmol/L (ref 22–32)
Calcium: 8.4 mg/dL — ABNORMAL LOW (ref 8.9–10.3)
Chloride: 104 mmol/L (ref 98–111)
Creatinine, Ser: 0.82 mg/dL (ref 0.61–1.24)
GFR calc Af Amer: >60 mL/min (ref >60)
GFR calc non Af Amer: >60 mL/min (ref >60)
Glucose, Bld: 86 mg/dL (ref 70–99)
Potassium: 3.6 mmol/L (ref 3.5–5.1)
Sodium: 139 mmol/L (ref 135–145)
Total Bilirubin: 0.4 mg/dL (ref 0.3–1.2)
Total Protein: 8.5 g/dL — ABNORMAL HIGH (ref 6.5–8.1)

## 2019-07-02 LAB — PROTIME-INR
INR: 1 (ref 0.8–1.2)
Prothrombin Time: 13.2 seconds (ref 11.4–15.2)

## 2019-07-02 LAB — APTT: aPTT: 29 seconds (ref 24–36)

## 2019-07-02 MED ORDER — SODIUM CHLORIDE 0.9% FLUSH
3.0000 mL | Freq: Once | INTRAVENOUS | Status: DC
Start: 2019-07-02 — End: 2019-07-03

## 2019-07-02 NOTE — ED Notes (Signed)
Pt stated that he is going to leave AMA due to the wait. This tech advised pt to come back if symptoms worsen.

## 2019-07-02 NOTE — Telephone Encounter (Deleted)
Patient called today and left a VM for [place] to call him back. He stated on the message that he "does not know how to do anything now." Asked for a number for counseling. I called Jacob Holder back and we spoke at great length concerning his current symptoms. He states that now that he is home he realizes that he does not remember how to do simple tasks, such as, using his phone, texting or making a phone call. Cannot do simple everyday tasks. He states he is having a feeling of "spasms" in his head. These are not constant but persistent. He also states that his vision is still much worse than before his procedure, even with his contacts in. The patient came in for an elective brain aneurysm treatment with Dr. Estanislado Pandy on 06/26/19. Following his procedure he developed multiple complications including hemorrhage, and multiple infarcts. A drug screen revealed +cocaine and benzos in his system. The patient was seen by neurology and admitted to neuro ICU. He decided on 06/30/19 that he was leaving the hospital "no matter what." He was advised by multiple staff to stay but refused. Now he calls back with continued symptoms and needing PT, OT, ST. I spoke with Dr. Estanislado Pandy as well as Fabio Pierce (4N social worker) who both state that due to Jacob Holder leaving AMA they are unable to advise him of any outpatient treatment other than to come to ED for re-evaluation. I called Jacob Holder back and he states that he is on his way back to [facility] ED for re-evaluation of stroke symptoms. JM

## 2019-07-02 NOTE — Telephone Encounter (Signed)
Pt called me to say that he had been in the ED for approximately 6 hours waiting on someone to give him his CT and lab results. He states that he cannot wait any longer and would not have a ride home if he waits any longer. I encouraged him to stay if at all possible. JM

## 2019-07-02 NOTE — Telephone Encounter (Signed)
Pt called and left VM for [place] to call him back. I called pt and he states that he is having continued symptoms of visual disturbances, spasms in his head, and not knowing how to do simple daily tasks such as using his phone, texting, or making calls. Pt was recently admitted and left the hospital AMA. Pt was told to go to the ED. JM

## 2019-07-02 NOTE — ED Triage Notes (Addendum)
Pt states he had aneurysm surgery on 12/2.  States he woke up this morning around 6am  and couldn't remember anything.  States he doesn't remember how to use phone.  C/o headache since surgery.   Denies weakness.  Reports blurred vision to bilateral eyes.  No arm drift.

## 2019-07-03 ENCOUNTER — Inpatient Hospital Stay (HOSPITAL_COMMUNITY)
Admission: EM | Admit: 2019-07-03 | Discharge: 2019-07-04 | DRG: 064 | Disposition: A | Discharge status: Home / Self Care | Payer: Managed Care, Other (non HMO) | Attending: Internal Medicine | Admitting: Internal Medicine

## 2019-07-03 ENCOUNTER — Emergency Department (HOSPITAL_COMMUNITY): Payer: Managed Care, Other (non HMO)

## 2019-07-03 ENCOUNTER — Other Ambulatory Visit: Payer: Self-pay

## 2019-07-03 DIAGNOSIS — Z791 Long term (current) use of non-steroidal anti-inflammatories (NSAID): Secondary | ICD-10-CM

## 2019-07-03 DIAGNOSIS — Z9114 Patient's other noncompliance with medication regimen: Secondary | ICD-10-CM

## 2019-07-03 DIAGNOSIS — K219 Gastro-esophageal reflux disease without esophagitis: Secondary | ICD-10-CM | POA: Diagnosis present

## 2019-07-03 DIAGNOSIS — D649 Anemia, unspecified: Secondary | ICD-10-CM | POA: Diagnosis present

## 2019-07-03 DIAGNOSIS — I6389 Other cerebral infarction: Principal | ICD-10-CM | POA: Diagnosis present

## 2019-07-03 DIAGNOSIS — F419 Anxiety disorder, unspecified: Secondary | ICD-10-CM | POA: Diagnosis present

## 2019-07-03 DIAGNOSIS — R29701 NIHSS score 1: Secondary | ICD-10-CM | POA: Diagnosis present

## 2019-07-03 DIAGNOSIS — I609 Nontraumatic subarachnoid hemorrhage, unspecified: Secondary | ICD-10-CM | POA: Diagnosis present

## 2019-07-03 DIAGNOSIS — Z8349 Family history of other endocrine, nutritional and metabolic diseases: Secondary | ICD-10-CM

## 2019-07-03 DIAGNOSIS — Z79899 Other long term (current) drug therapy: Secondary | ICD-10-CM

## 2019-07-03 DIAGNOSIS — F1423 Cocaine dependence with withdrawal: Secondary | ICD-10-CM | POA: Diagnosis present

## 2019-07-03 DIAGNOSIS — Z20828 Contact with and (suspected) exposure to other viral communicable diseases: Secondary | ICD-10-CM | POA: Diagnosis present

## 2019-07-03 DIAGNOSIS — I10 Essential (primary) hypertension: Secondary | ICD-10-CM | POA: Diagnosis present

## 2019-07-03 DIAGNOSIS — I639 Cerebral infarction, unspecified: Secondary | ICD-10-CM | POA: Diagnosis present

## 2019-07-03 DIAGNOSIS — H547 Unspecified visual loss: Secondary | ICD-10-CM | POA: Diagnosis present

## 2019-07-03 DIAGNOSIS — F1491 Cocaine use, unspecified, in remission: Secondary | ICD-10-CM | POA: Diagnosis present

## 2019-07-03 DIAGNOSIS — Z87898 Personal history of other specified conditions: Secondary | ICD-10-CM | POA: Diagnosis present

## 2019-07-03 DIAGNOSIS — I671 Cerebral aneurysm, nonruptured: Secondary | ICD-10-CM | POA: Diagnosis present

## 2019-07-03 DIAGNOSIS — D696 Thrombocytopenia, unspecified: Secondary | ICD-10-CM | POA: Diagnosis present

## 2019-07-03 DIAGNOSIS — Z833 Family history of diabetes mellitus: Secondary | ICD-10-CM

## 2019-07-03 DIAGNOSIS — F17210 Nicotine dependence, cigarettes, uncomplicated: Secondary | ICD-10-CM | POA: Diagnosis present

## 2019-07-03 DIAGNOSIS — Z7982 Long term (current) use of aspirin: Secondary | ICD-10-CM

## 2019-07-03 DIAGNOSIS — I615 Nontraumatic intracerebral hemorrhage, intraventricular: Secondary | ICD-10-CM | POA: Diagnosis present

## 2019-07-03 DIAGNOSIS — F1990 Other psychoactive substance use, unspecified, uncomplicated: Secondary | ICD-10-CM | POA: Diagnosis present

## 2019-07-03 HISTORY — DX: Cerebral infarction, unspecified: I63.9

## 2019-07-03 HISTORY — DX: Cocaine abuse, uncomplicated: F14.10

## 2019-07-03 LAB — CBC WITH DIFFERENTIAL/PLATELET
Abs Immature Granulocytes: 0.02 10*3/uL (ref 0.00–0.07)
Basophils Absolute: 0 10*3/uL (ref 0.0–0.1)
Basophils Relative: 1 %
Eosinophils Absolute: 0.2 10*3/uL (ref 0.0–0.5)
Eosinophils Relative: 5 %
HCT: 37.8 % — ABNORMAL LOW (ref 39.0–52.0)
Hemoglobin: 12.3 g/dL — ABNORMAL LOW (ref 13.0–17.0)
Immature Granulocytes: 1 %
Lymphocytes Relative: 37 %
Lymphs Abs: 1.3 10*3/uL (ref 0.7–4.0)
MCH: 28.4 pg (ref 26.0–34.0)
MCHC: 32.5 g/dL (ref 30.0–36.0)
MCV: 87.3 fL (ref 80.0–100.0)
Monocytes Absolute: 0.3 10*3/uL (ref 0.1–1.0)
Monocytes Relative: 9 %
Neutro Abs: 1.7 10*3/uL (ref 1.7–7.7)
Neutrophils Relative %: 47 %
Platelets: 127 10*3/uL — ABNORMAL LOW (ref 150–400)
RBC: 4.33 MIL/uL (ref 4.22–5.81)
RDW: 12.6 % (ref 11.5–15.5)
WBC: 3.5 10*3/uL — ABNORMAL LOW (ref 4.0–10.5)
nRBC: 0 % (ref 0.0–0.2)

## 2019-07-03 LAB — COMPREHENSIVE METABOLIC PANEL
ALT: 48 U/L — ABNORMAL HIGH (ref 0–44)
AST: 42 U/L — ABNORMAL HIGH (ref 15–41)
Albumin: 3.2 g/dL — ABNORMAL LOW (ref 3.5–5.0)
Alkaline Phosphatase: 57 U/L (ref 38–126)
Anion gap: 9 (ref 5–15)
BUN: 8 mg/dL (ref 6–20)
CO2: 25 mmol/L (ref 22–32)
Calcium: 8.6 mg/dL — ABNORMAL LOW (ref 8.9–10.3)
Chloride: 105 mmol/L (ref 98–111)
Creatinine, Ser: 0.68 mg/dL (ref 0.61–1.24)
GFR calc Af Amer: >60 mL/min (ref >60)
GFR calc non Af Amer: >60 mL/min (ref >60)
Glucose, Bld: 89 mg/dL (ref 70–99)
Potassium: 4.1 mmol/L (ref 3.5–5.1)
Sodium: 139 mmol/L (ref 135–145)
Total Bilirubin: 0.8 mg/dL (ref 0.3–1.2)
Total Protein: 8.4 g/dL — ABNORMAL HIGH (ref 6.5–8.1)

## 2019-07-03 LAB — RAPID URINE DRUG SCREEN, HOSP PERFORMED
Amphetamines: NOT DETECTED
Barbiturates: NOT DETECTED
Benzodiazepines: NOT DETECTED
Cocaine: NOT DETECTED
Opiates: NOT DETECTED
Tetrahydrocannabinol: NOT DETECTED

## 2019-07-03 LAB — URINALYSIS, ROUTINE W REFLEX MICROSCOPIC
Bilirubin Urine: NEGATIVE
Glucose, UA: NEGATIVE mg/dL
Hgb urine dipstick: NEGATIVE
Ketones, ur: NEGATIVE mg/dL
Leukocytes,Ua: NEGATIVE
Nitrite: NEGATIVE
Protein, ur: NEGATIVE mg/dL
Specific Gravity, Urine: 1.02 (ref 1.005–1.030)
pH: 6 (ref 5.0–8.0)

## 2019-07-03 LAB — SARS CORONAVIRUS 2 (TAT 6-24 HRS): SARS Coronavirus 2: NEGATIVE

## 2019-07-03 LAB — ETHANOL: Alcohol, Ethyl (B): <10 mg/dL (ref <10)

## 2019-07-03 MED ORDER — SODIUM CHLORIDE 0.9 % IV BOLUS
1000.0000 mL | Freq: Once | INTRAVENOUS | Status: AC
  Administered 2019-07-03: 1000 mL via INTRAVENOUS

## 2019-07-03 MED ORDER — IBUPROFEN 200 MG PO TABS: 600.0000 mg | ORAL_TABLET | Freq: Four times a day (QID) | ORAL | Status: DC | PRN

## 2019-07-03 MED ORDER — VERAPAMIL HCL 80 MG PO TABS
80.0000 mg | ORAL_TABLET | Freq: Three times a day (TID) | ORAL | Status: DC
  Administered 2019-07-03 – 2019-07-04 (×2): 80 mg via ORAL
  Filled 2019-07-03 (×4): qty 1

## 2019-07-03 MED ORDER — LORAZEPAM 2 MG/ML IJ SOLN: 1.0000 mg | Freq: Once | INTRAMUSCULAR | Status: DC | PRN

## 2019-07-03 MED ORDER — OXYCODONE-ACETAMINOPHEN 5-325 MG PO TABS
1.0000 | ORAL_TABLET | Freq: Once | ORAL | Status: AC
  Administered 2019-07-03: 1 via ORAL
  Filled 2019-07-03: qty 1

## 2019-07-03 MED ORDER — ASPIRIN 81 MG PO CHEW
81.0000 mg | CHEWABLE_TABLET | Freq: Every day | ORAL | Status: DC
  Administered 2019-07-03 – 2019-07-04 (×2): 81 mg via ORAL
  Filled 2019-07-03: qty 1

## 2019-07-03 MED ORDER — IOHEXOL 350 MG/ML SOLN
75.0000 mL | Freq: Once | INTRAVENOUS | Status: AC | PRN
  Administered 2019-07-03: 75 mL via INTRAVENOUS

## 2019-07-03 MED ORDER — CLOPIDOGREL BISULFATE 75 MG PO TABS
75.0000 mg | ORAL_TABLET | Freq: Every day | ORAL | Status: DC
  Administered 2019-07-03 – 2019-07-04 (×2): 75 mg via ORAL
  Filled 2019-07-03: qty 1

## 2019-07-03 MED ORDER — BUSPIRONE HCL 10 MG PO TABS
5.0000 mg | ORAL_TABLET | Freq: Two times a day (BID) | ORAL | Status: DC
  Administered 2019-07-03 – 2019-07-04 (×2): 5 mg via ORAL
  Filled 2019-07-03 (×2): qty 1

## 2019-07-03 NOTE — ED Notes (Signed)
Patient transported to CT 

## 2019-07-03 NOTE — ED Provider Notes (Signed)
[facility] EMERGENCY DEPARTMENT Provider Note   CSN: [id number] Arrival date & time: 07/03/19  0546     History Chief Complaint  Patient presents with  . Headache  . Numbness    Jacob Holder is a 28 y.o. male.  HPI      Jacob Holder is a 28 y.o. male, with a history of basilar aneurysm embolization, presenting to the ED with difficulty with expressing his thoughts in written form as well as memory recall.  He states this has been going on since his surgery to repair his aneurysm, but became much more evident the day after he left the hospital. For example, patient states, "If I try to think of a family member's phone number, I just cannot think of it and then it gets me all frustrated."  He also states, "I do not know how to use my phone anymore." He also notes worsening vision, "like I need to update my contacts prescription." He has been experiencing momentary episodes of sharp, bilateral, frontal headache pain, mild in intensity. He expresses remorse that he left AMA from the hospital.  Denies alcohol/illicit drug use since his hospitalization. Denies fever/chills, dizziness, syncope, seizure, numbness, weakness, vision loss, facial droop, chest pain, shortness of breath, nausea/vomiting, or any other complaints.  December 23: Patient underwent staged embolization of basilar aneurysm performed by Dr. Corliss Skains of IR.  Aneurysm was discovered following work-up from recurrent headaches. Patient had complicated hospital course post procedure due to agitation/combativeness and hypotension.  Required Precedex. December 24: Brain MRI with basilar cistern subarachnoid hemorrhage and small intraventricular hemorrhage as well as scattered acute infarcts in bilateral parietal lobe, left occipital lobe, and right cerebellum.  Subsequent noncontrast head CT the same day indicated stability of PCA territory infarcts. December 25: Noncontrast head CT showed continued  evolution of bilateral PCA territory nonhemorrhagic infarcts. December 27: Patient told his team that he felt better and wanted to leave.  Patient left the hospital AMA. December 28: Patient states Dr. Fatima Sanger office contacted the patient to follow-up.  Patient discussed his symptoms with them and he was reportedly told that he may need to be placed in a post stroke therapy program. December 29: Patient came to the ED, underwent head CT, results were normal.  Left without being seen.  Past Medical History:  Diagnosis Date  . Aneurysm (HCC)   . Anxiety   . Depression   . GERD (gastroesophageal reflux disease)   . Headache   . Wears contact lenses     Patient Active Problem List   Diagnosis Date Noted  . Hypokalemia   . History of ETT   . SAH (subarachnoid hemorrhage) (HCC) 06/27/2019  . IVH (intraventricular hemorrhage) (HCC) 06/27/2019  . 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 Aphasia 06/27/2019  . 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/26/2019  . Acute respiratory failure with hypoxemia (HCC)   . Acute metabolic encephalopathy   . Essential hypertension     Past Surgical History:  Procedure Laterality Date  . IR ANGIO INTRA EXTRACRAN SEL COM CAROTID INNOMINATE BILAT MOD SED  03/05/2019  . IR ANGIO VERTEBRAL SEL VERTEBRAL BILAT MOD SED  03/05/2019  . IR ANGIO VERTEBRAL SEL VERTEBRAL UNI R MOD SED  06/26/2019  . IR ANGIOGRAM FOLLOW UP STUDY  06/26/2019  . IR NEURO EACH ADD'L AFTER BASIC UNI RIGHT (MS)  06/26/2019  . IR TRANSCATH/EMBOLIZ  06/26/2019  .  RADIOLOGY WITH ANESTHESIA N/A 06/26/2019   Procedure: EMBOLIZATION;  Surgeon: Julieanne Cotton, MD;  Location: [facility] OR;  Service: Radiology;  Laterality: N/A;  . RECTAL SURGERY    . TOOTH EXTRACTION         Family History  Problem Relation Age of Onset  . Healthy Mother   . Thyroid disease Mother   . Diabetes  Father     Social History   Tobacco Use  . Smoking status: Current Every Day Smoker    Packs/day: 0.50    Types: Cigarettes  . Smokeless tobacco: Never Used  Substance Use Topics  . Alcohol use: Yes    Comment: from time to time  . Drug use: No    Home Medications Prior to Admission medications   Medication Sig Start Date End Date Taking? Authorizing Provider  aspirin 81 MG chewable tablet Chew 81 mg by mouth daily.   Yes [provider]  clopidogrel (PLAVIX) 75 MG tablet Take 75 mg by mouth daily.    Yes [provider]  ibuprofen (ADVIL) 200 MG tablet Take 600 mg by mouth every 6 (six) hours as needed for headache.   Yes [provider]  verapamil (CALAN) 80 MG tablet Take 1 tablet (80 mg total) by mouth 3 (three) times daily. 02/20/19  Yes Drema Dallas, DO    Allergies    Patient has no known allergies.  Review of Systems   Review of Systems  Constitutional: Negative for fever.  Respiratory: Negative for shortness of breath.   Cardiovascular: Negative for chest pain.  Gastrointestinal: Negative for abdominal pain, nausea and vomiting.  Musculoskeletal: Negative for neck pain.  Neurological: Positive for headaches. Negative for dizziness, seizures, syncope, facial asymmetry, speech difficulty, weakness and numbness.       Difficulty with recalling information and expressing himself in writing.  Psychiatric/Behavioral: Negative for confusion and hallucinations.  All other systems reviewed and are negative.   Physical Exam Updated Vital Signs BP (!) 146/102   Pulse 86   Temp 98.2 F (36.8 C) (Oral)   Resp 16   SpO2 100%   Physical Exam Vitals and nursing note reviewed.  Constitutional:      General: He is not in acute distress.    Appearance: He is well-developed. He is not diaphoretic.  HENT:     Head: Normocephalic and atraumatic.     Mouth/Throat:     Mouth: Mucous membranes are moist.     Pharynx: Oropharynx is clear.  Eyes:       Extraocular Movements: Extraocular movements intact.     Conjunctiva/sclera: Conjunctivae normal.     Pupils: Pupils are equal, round, and reactive to light.     Comments:   Visual Acuity  Right Eye Distance: 20/25 Left Eye Distance: 20/25 Bilateral Distance: 20/20(pt wearing contacts)  Right Eye Near:   Left Eye Near:    Bilateral Near:     Cardiovascular:     Rate and Rhythm: Normal rate and regular rhythm.     Pulses: Normal pulses.          Radial pulses are 2+ on the right side and 2+ on the left side.       Posterior tibial pulses are 2+ on the right side and 2+ on the left side.     Heart sounds: Normal heart sounds.     Comments: Tactile temperature in the extremities appropriate and equal bilaterally. Pulmonary:     Effort: Pulmonary effort is normal. No respiratory  distress.     Breath sounds: Normal breath sounds.  Abdominal:     Palpations: Abdomen is soft.     Tenderness: There is no abdominal tenderness. There is no guarding.  Musculoskeletal:     Cervical back: Neck supple.     Right lower leg: No edema.     Left lower leg: No edema.  Lymphadenopathy:     Cervical: No cervical adenopathy.  Skin:    General: Skin is warm and dry.  Neurological:     Mental Status: He is alert and oriented to person, place, and time.     Comments: Sensation grossly intact to light touch in the extremities. No noted speech deficits. No aphasia. Patient handles oral secretions without difficulty. No noted swallowing defects.  Equal grip strength bilaterally. No arm drift. Strength 5/5 in the upper extremities. Strength 5/5 in the lower extremities.  Coordination seems to be grossly intact with finger to nose testing.  Cranial nerves III-XII grossly intact.  No noted visual field deficit. EOMs are intact and in sync. No noted eye or lid ptosis.  Pupils are PERRL. No facial droop.  I noted the patient was able to pick up and unlock his phone, open a text message, and repeat  information from the screen. He was able to write on a piece of paper, copying words I had already written on the paper.  He was also able to write words that were verbally told to him.  Psychiatric:        Mood and Affect: Mood and affect normal.        Speech: Speech normal.        Behavior: Behavior normal.     ED Results / Procedures / Treatments   Labs (all labs ordered are listed, but only abnormal results are displayed) Labs Reviewed  COMPREHENSIVE METABOLIC PANEL - Abnormal; Notable for the following components:      Result Value   Calcium 8.6 (*)    Total Protein 8.4 (*)    Albumin 3.2 (*)    AST 42 (*)    ALT 48 (*)    All other components within normal limits  CBC WITH DIFFERENTIAL/PLATELET - Abnormal; Notable for the following components:   WBC 3.5 (*)    Hemoglobin 12.3 (*)    HCT 37.8 (*)    Platelets 127 (*)    All other components within normal limits  SARS CORONAVIRUS 2 (TAT 6-24 HRS)  ETHANOL  URINALYSIS, ROUTINE W REFLEX MICROSCOPIC  RAPID URINE DRUG SCREEN, HOSP PERFORMED    EKG None  Radiology CT Angio Head W or Wo Contrast  Result Date: 07/03/2019 CLINICAL DATA:  Subarachnoid hemorrhage [facility](SAH), follow-up. EXAM: CT ANGIOGRAPHY HEAD AND NECK TECHNIQUE: Multidetector CT imaging of the head and neck was performed using the standard protocol during bolus administration of intravenous contrast. Multiplanar CT image reconstructions and MIPs were obtained to evaluate the vascular anatomy. Carotid stenosis measurements (when applicable) are obtained utilizing NASCET criteria, using the distal internal carotid diameter as the denominator. CONTRAST:  75mL OMNIPAQUE IOHEXOL 350 MG/ML SOLN COMPARISON:  CT head performed 1 day earlier 07/02/2019, head CT 06/28/2019, head CT 06/27/2019, brain MRI 06/27/2019, CT angiogram head/neck 06/26/2019. FINDINGS: CT HEAD FINDINGS Brain: Known recent bilateral parietooccipital and right cerebellar infarcts were better appreciated on  prior examinations. No CT evidence of new acute infarct. No evidence of acute intracranial hemorrhage. No evidence of internal mass. No midline shift or extra-axial fluid collection. Vascular: Reported separately Skull: Normal.  Negative for fracture or focal lesion. Sinuses: Minimal ethmoid sinus mucosal thickening. No significant mastoid effusion. Orbits: Visualized orbits demonstrate no acute abnormality. Review of the MIP images confirms the above findings CTA NECK FINDINGS Aortic arch: Standard aortic branching. Right carotid system: CCA and ICA patent within the neck without stenosis. Left carotid system: CCA and ICA patent within the neck without stenosis. Vertebral arteries: The vertebral arteries are codominant and patent throughout the neck without significant stenosis. Skeleton: No acute bony abnormality. Other neck: No soft tissue neck mass or cervical lymphadenopathy. Upper chest: No consolidation within the imaged lung apices. Review of the MIP images confirms the above findings CTA HEAD FINDINGS Anterior circulation: The intracranial internal carotid arteries are patent without significant stenosis. The bilateral middle and anterior cerebral arteries are patent without proximal branch occlusion or proximal high-grade stenosis. Hypoplastic A1 left anterior cerebral artery. Redemonstrated 2 mm infundibulum projecting inferiorly from the supraclinoid right internal carotid artery. No anterior circulation aneurysm is identified. Posterior circulation: The intracranial vertebral arteries are patent without significant stenosis. The vertebrobasilar junction is normal. Redemonstrated stent visualized from the mid basilar artery extending into the left posterior cerebral artery P2 segment. Unchanged, there is decreased caliber of the P2 left posterior cerebral artery just distal to the stent. This appearance is likely exaggerated due to artifact. Distal to this, no occlusion or high-grade stenosis is  demonstrated within the P2 left PCA. The right posterior cerebral artery is patent without significant proximal stenosis. Venous sinuses: Within limitations of contrast timing, no convincing thrombus. Anatomic variants: As described Review of the MIP images confirms the above findings IMPRESSION: CT head: 1. Known recent bilateral parietooccipital and right cerebellar infarcts better appreciated on prior exams. 2. No evidence of acute intracranial hemorrhage or new acute infarct. CTA neck: The common carotid, internal carotid and vertebral arteries are patent within the neck without significant stenosis. CTA head: 1. Unchanged appearance of the intracranial arterial circulation as compared to CTA head 05/27/2019. Redemonstrated stent extending from the mid basilar artery into the P2 left PCA. As before, there is decreased caliber of the left P2 segment just beyond the stent, likely exaggerated by artifact. 2. The remaining branch vessels of the circle-of-Willis are unremarkable without proximal occlusion or high-grade proximal stenosis. Electronically Signed   By: Kellie Simmering DO   On: 07/03/2019 14:21   CT HEAD WO CONTRAST  Result Date: 07/02/2019 CLINICAL DATA:  Possible stroke, focal neuro deficit, greater than 6 hours, stroke suspected. Additional history provided: Lapses and memory, difficulty performing tasks, headache EXAM: CT HEAD WITHOUT CONTRAST TECHNIQUE: Contiguous axial images were obtained from the base of the skull through the vertex without intravenous contrast. COMPARISON:  Head CT examinations 06/28/2019 and earlier, brain MRI 06/27/2019, CT angiogram head 06/26/2019 FINDINGS: Brain: Known recent bilateral parietooccipital and right cerebellar infarcts were better appreciated on prior examinations. No CT evidence of new acute infarct. Previously demonstrated subarachnoid and intraventricular hemorrhage is no longer appreciated. No evidence of intracranial mass. No midline shift or extra-axial  fluid collection. Cerebral volume is normal for age. Vascular: Redemonstrated stent extending from the mid basilar artery into the left posterior cerebral artery. No vascular calcifications. Skull: Normal. Negative for fracture or focal lesion. Sinuses/Orbits: Visualized orbits demonstrate no acute abnormality. No significant paranasal sinus disease or mastoid effusion at the imaged levels. IMPRESSION: Known recent bilateral parietooccipital and right cerebellar infarcts were better appreciated on prior examinations. No CT evidence of new acute infarct. Consider brain MRI for further evaluation, as  clinically warranted. Previously demonstrated subarachnoid and intraventricular hemorrhage is no longer appreciated. Redemonstrated basilar artery/left posterior cerebral artery stent. Electronically Signed   By: Jackey Loge DO   On: 07/02/2019 18:15   CT Angio Neck W and/or Wo Contrast  Result Date: 07/03/2019 CLINICAL DATA:  Subarachnoid hemorrhage [facility]), follow-up. EXAM: CT ANGIOGRAPHY HEAD AND NECK TECHNIQUE: Multidetector CT imaging of the head and neck was performed using the standard protocol during bolus administration of intravenous contrast. Multiplanar CT image reconstructions and MIPs were obtained to evaluate the vascular anatomy. Carotid stenosis measurements (when applicable) are obtained utilizing NASCET criteria, using the distal internal carotid diameter as the denominator. CONTRAST:  75mL OMNIPAQUE IOHEXOL 350 MG/ML SOLN COMPARISON:  CT head performed 1 day earlier 07/02/2019, head CT 06/28/2019, head CT 06/27/2019, brain MRI 06/27/2019, CT angiogram head/neck 06/26/2019. FINDINGS: CT HEAD FINDINGS Brain: Known recent bilateral parietooccipital and right cerebellar infarcts were better appreciated on prior examinations. No CT evidence of new acute infarct. No evidence of acute intracranial hemorrhage. No evidence of internal mass. No midline shift or extra-axial fluid collection. Vascular:  Reported separately Skull: Normal. Negative for fracture or focal lesion. Sinuses: Minimal ethmoid sinus mucosal thickening. No significant mastoid effusion. Orbits: Visualized orbits demonstrate no acute abnormality. Review of the MIP images confirms the above findings CTA NECK FINDINGS Aortic arch: Standard aortic branching. Right carotid system: CCA and ICA patent within the neck without stenosis. Left carotid system: CCA and ICA patent within the neck without stenosis. Vertebral arteries: The vertebral arteries are codominant and patent throughout the neck without significant stenosis. Skeleton: No acute bony abnormality. Other neck: No soft tissue neck mass or cervical lymphadenopathy. Upper chest: No consolidation within the imaged lung apices. Review of the MIP images confirms the above findings CTA HEAD FINDINGS Anterior circulation: The intracranial internal carotid arteries are patent without significant stenosis. The bilateral middle and anterior cerebral arteries are patent without proximal branch occlusion or proximal high-grade stenosis. Hypoplastic A1 left anterior cerebral artery. Redemonstrated 2 mm infundibulum projecting inferiorly from the supraclinoid right internal carotid artery. No anterior circulation aneurysm is identified. Posterior circulation: The intracranial vertebral arteries are patent without significant stenosis. The vertebrobasilar junction is normal. Redemonstrated stent visualized from the mid basilar artery extending into the left posterior cerebral artery P2 segment. Unchanged, there is decreased caliber of the P2 left posterior cerebral artery just distal to the stent. This appearance is likely exaggerated due to artifact. Distal to this, no occlusion or high-grade stenosis is demonstrated within the P2 left PCA. The right posterior cerebral artery is patent without significant proximal stenosis. Venous sinuses: Within limitations of contrast timing, no convincing thrombus.  Anatomic variants: As described Review of the MIP images confirms the above findings IMPRESSION: CT head: 1. Known recent bilateral parietooccipital and right cerebellar infarcts better appreciated on prior exams. 2. No evidence of acute intracranial hemorrhage or new acute infarct. CTA neck: The common carotid, internal carotid and vertebral arteries are patent within the neck without significant stenosis. CTA head: 1. Unchanged appearance of the intracranial arterial circulation as compared to CTA head 05/27/2019. Redemonstrated stent extending from the mid basilar artery into the P2 left PCA. As before, there is decreased caliber of the left P2 segment just beyond the stent, likely exaggerated by artifact. 2. The remaining branch vessels of the circle-of-Willis are unremarkable without proximal occlusion or high-grade proximal stenosis. Electronically Signed   By: Jackey Loge DO   On: 07/03/2019 14:21    Procedures Procedures (including  critical care time)  Medications Ordered in ED Medications  LORazepam (ATIVAN) injection 1 mg (has no administration in time range)  sodium chloride 0.9 % bolus 1,000 mL (1,000 mLs Intravenous New Bag/Given 07/03/19 1150)  iohexol (OMNIPAQUE) 350 MG/ML injection 75 mL (75 mLs Intravenous Contrast Given 07/03/19 1320)    ED Course  I have reviewed the triage vital signs and the nursing notes.  Pertinent labs & imaging results that were available during my care of the patient were reviewed by me and considered in my medical decision making (see chart for details).  Clinical Course as of Jul 03 1607  Wed Jul 03, 2019  1114 Spoke with Dr. Wilford Corner, neurologist.  He states that based on the findings on his previous MRI, it would be logical to expect the patient to be having the symptoms he is describing.   Requests CTA head and neck and MRI brain without contrast.  Call him back following imaging results.    [SJ]  1521 Previously 95, then 125.  Platelets(!): 127  [SJ]    Clinical Course User Index [SJ] Roldan Laforest, Hillard Danker, PA-C   MDM Rules/Calculators/A&P                       Patient presents with ongoing symptoms of subjective cognitive dysfunction, memory recall, and subjectively diminished visual acuity. Patient is nontoxic appearing, afebrile, not tachycardic, not tachypneic, not hypotensive, maintains excellent SPO2 on room air, and is in no apparent distress.  No focal neuro deficits on exam. CTA head and neck without acute abnormality.  Findings and plan of care discussed with Chaney Malling, MD.   End of shift patient care handoff report given to [facility], PA-C. Plan: Patient awaiting MRI.  After MRI recontact neurology.   Final Clinical Impression(s) / ED Diagnoses Final diagnoses:  None    Rx / DC Orders ED Discharge Orders    None       Concepcion Living 07/03/19 1612    Charlynne Pander, MD 07/04/19 [phone]

## 2019-07-03 NOTE — Consult Note (Addendum)
NEURO HOSPITALIST CONSULT NOTE   Requesting physician: Dr. Darl Householder  Reason for Consult: New left cerebellar stroke  History obtained from:   Patient and Chart     HPI:                                                                                                                                          Cleve Paolillo is an 28 y.o. male patient of Dr. 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 His symptoms had consisted of daily headaches - he had been seen in Aug and Sept 2020 for same.   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 Both posterior cerebral arteries arising from fundus of the lobulated aneurysm. He had been scheduled for embolization for several weeks-- but P2y12 had continued to be out of range.   He started taking Plavix 150 mg daily on 12/15 and P2y12 goal was achieved soon afterwards - endovascular procedure was then scheduled for Dec 23. He underwent a right [facility] angiogram followed by staged embolization of large wide neck basilar apex aneurysm using a 4.5 mm x 30 mm neuroform ATLAS stent, on December 23.  Following the procedure, he had acute vision loss and confusion. MRI revealed scattered acute infarcts in the bilateral parietal lobes, left occipital lobe and right cerebellum. Also present was basilar cistern subarachnoid hemorrhage and small volume intraventricular hemorrhage.   He was cared for in the ICU due to intubation and improved over the subsequent days. A component of the agitation he had been experiencing during his stay was felt to be due to polysubstance withdrawal (tox screen during the admission was positive for cocaine). He was alert and able to watch TV with improved headache by 12/27. Plan had been to obtain rehab evaluation, PT/OT, social services and possibly placement. Inpatient  medications were ASA 81 qd, Plavix 150 mg qd, Advil PRN and verapamil 80 mg TID. He at that point decided to leave the hospital AMA.   He returns today with complaints of waking up during the night being unable to remember anything, in addition to numbness and tingling in his hands, headaches and "not feeling right". He denied blurred vision.   Past Medical History:  Diagnosis Date  . Aneurysm ([place])   . Anxiety   . Depression   . GERD (gastroesophageal reflux disease)   . Headache   . Wears contact lenses     Past Surgical History:  Procedure Laterality Date  . IR ANGIO INTRA EXTRACRAN SEL COM CAROTID INNOMINATE BILAT MOD SED  03/05/2019  . IR ANGIO VERTEBRAL SEL VERTEBRAL BILAT MOD SED  03/05/2019  . IR  ANGIO VERTEBRAL SEL VERTEBRAL UNI R MOD SED  06/26/2019  . IR ANGIOGRAM FOLLOW UP STUDY  06/26/2019  . IR NEURO EACH ADD'L AFTER BASIC UNI RIGHT ([facility])  06/26/2019  . IR TRANSCATH/EMBOLIZ  06/26/2019  . RADIOLOGY WITH ANESTHESIA N/A 06/26/2019   Procedure: EMBOLIZATION;  Surgeon: Julieanne Cotton, MD;  Location: [facility] OR;  Service: Radiology;  Laterality: N/A;  . RECTAL SURGERY    . TOOTH EXTRACTION      Family History  Problem Relation Age of Onset  . Healthy Mother   . Thyroid disease Mother   . Diabetes Father               Social History:  reports that he has been smoking cigarettes. He has been smoking about 0.50 packs per day. He has never used smokeless tobacco. He reports current alcohol use. He reports that he does not use drugs.  No Known Allergies  HOME MEDICATIONS:                                                                                                                      No current facility-administered medications on file prior to encounter.   Current Outpatient Medications on File Prior to Encounter  Medication Sig Dispense Refill  . aspirin 81 MG chewable tablet Chew 81 mg by mouth daily.    . clopidogrel (PLAVIX) 75 MG tablet Take 75 mg by mouth daily.      Marland Kitchen ibuprofen (ADVIL) 200 MG tablet Take 600 mg by mouth every 6 (six) hours as needed for headache.    . verapamil (CALAN) 80 MG tablet Take 1 tablet (80 mg total) by mouth 3 (three) times daily. 90 tablet 3  The patient states that he takes his ASA and Plavix intermittently.    ROS:                                                                                                                                       As per HPI. Comprehensive ROS otherwise negative.   Blood pressure 106/68, pulse 73, temperature 98.2 F (36.8 C), temperature source Oral, resp. rate (!) 24, SpO2 98 %.   General Examination:  Physical Exam  HEENT-  [place]/AT   Lungs- Respirations unlabored Extremities- No edema  Neurological Examination Mental Status: Awake and alert. Oriented 4/5 (did not remember the day). Speech fluent with intact comprehension and naming. Appears mildly to moderately anxious.  Cranial Nerves: II: Visual fields intact all 4 quadrants of OD an OS. Central vision intact - able to read a badge. PERRL.   III,IV, VI: Subtle saccadic quality of smooth pursuits when gazing to the right. EOMI without nystagmus.  V,VII: Smile symmetric, facial temp sensation equal bilaterally VIII: hearing intact to voice IX,X: Palate rises symmetrically XI: Symmetric shoulder shrug XII: midline tongue extension Motor: Right : Upper extremity   5/5    Left:     Upper extremity   5/5  Lower extremity   5/5     Lower extremity   5/5 No pronator drift.  Sensory: Temp and light touch intact in all 4 extremities. No extinction.  Deep Tendon Reflexes: 3+ upper and lower extremities, somewhat more brisk on the right.  Plantars: Right: downgoing   Left: downgoing Cerebellar: Mild dysmetria with FNF on the right. Left FNF and bilateral H-S normal.  Gait: Deferred   Lab Results: Basic Metabolic Panel: Recent Labs   Lab 06/27/19 0252 06/28/19 0501 06/29/19 0548 06/30/19 0655 07/02/19 1547 07/03/19 1148  NA 137 140 140 138 139 139  K 3.9 3.6 3.3* 3.8 3.6 4.1  CL 104 104 106 110 104 105  CO2 23 22 21* GLUCOSE 96 86 81 119* 86 89  BUN 5* 7 6 5* <5* 8  CREATININE 0.71 0.81 0.86 0.77 0.82 0.68  CALCIUM 8.6* 8.5* 7.8* 8.3* 8.4* 8.6*  MG 1.9  --   --   --   --   --   PHOS 2.6  --   --   --   --   --     CBC: Recent Labs  Lab 06/28/19 0501 06/29/19 0548 06/30/19 0655 07/02/19 1547 07/03/19 1148  WBC 5.5 5.3 5.1 4.4 3.5*  NEUTROABS  --  3.3  --  2.5 1.7  HGB 13.4 11.6* 12.1* 12.4* 12.3*  HCT 40.8 34.9* 36.2* 38.4* 37.8*  MCV 84.8 82.7 83.6 86.9 87.3  PLT 88* 89* 95* 125* 127*    Cardiac Enzymes: No results for input(s): CKTOTAL, CKMB, CKMBINDEX, TROPONINI in the last 168 hours.  Lipid Panel: Recent Labs  Lab 06/27/19 0252 06/28/19 0501  CHOL  --  133  TRIG 223* 178*  HDL  --  24*  CHOLHDL  --  5.5  VLDL  --  36  LDLCALC  --  73    Imaging: CT Angio Head W or Wo Contrast  Result Date: 07/03/2019 CLINICAL DATA:  Subarachnoid hemorrhage [facility]), follow-up. EXAM: CT ANGIOGRAPHY HEAD AND NECK TECHNIQUE: Multidetector CT imaging of the head and neck was performed using the standard protocol during bolus administration of intravenous contrast. Multiplanar CT image reconstructions and MIPs were obtained to evaluate the vascular anatomy. Carotid stenosis measurements (when applicable) are obtained utilizing NASCET criteria, using the distal internal carotid diameter as the denominator. CONTRAST:  75mL OMNIPAQUE IOHEXOL 350 MG/ML SOLN COMPARISON:  CT head performed 1 day earlier 07/02/2019, head CT 06/28/2019, head CT 06/27/2019, brain MRI 06/27/2019, CT angiogram head/neck 06/26/2019. FINDINGS: CT HEAD FINDINGS Brain: Known recent bilateral parietooccipital and right cerebellar infarcts were better appreciated on prior examinations. No CT evidence of new acute infarct. No evidence  of acute intracranial hemorrhage. No evidence of internal  mass. No midline shift or extra-axial fluid collection. Vascular: Reported separately Skull: Normal. Negative for fracture or focal lesion. Sinuses: Minimal ethmoid sinus mucosal thickening. No significant mastoid effusion. Orbits: Visualized orbits demonstrate no acute abnormality. Review of the MIP images confirms the above findings CTA NECK FINDINGS Aortic arch: Standard aortic branching. Right carotid system: CCA and ICA patent within the neck without stenosis. Left carotid system: CCA and ICA patent within the neck without stenosis. Vertebral arteries: The vertebral arteries are codominant and patent throughout the neck without significant stenosis. Skeleton: No acute bony abnormality. Other neck: No soft tissue neck mass or cervical lymphadenopathy. Upper chest: No consolidation within the imaged lung apices. Review of the MIP images confirms the above findings CTA HEAD FINDINGS Anterior circulation: The intracranial internal carotid arteries are patent without significant stenosis. The bilateral middle and anterior cerebral arteries are patent without proximal branch occlusion or proximal high-grade stenosis. Hypoplastic A1 left anterior cerebral artery. Redemonstrated 2 mm infundibulum projecting inferiorly from the supraclinoid right internal carotid artery. No anterior circulation aneurysm is identified. Posterior circulation: The intracranial vertebral arteries are patent without significant stenosis. The vertebrobasilar junction is normal. Redemonstrated stent visualized from the mid basilar artery extending into the left posterior cerebral artery P2 segment. Unchanged, there is decreased caliber of the P2 left posterior cerebral artery just distal to the stent. This appearance is likely exaggerated due to artifact. Distal to this, no occlusion or high-grade stenosis is demonstrated within the P2 left PCA. The right posterior cerebral artery is  patent without significant proximal stenosis. Venous sinuses: Within limitations of contrast timing, no convincing thrombus. Anatomic variants: As described Review of the MIP images confirms the above findings IMPRESSION: CT head: 1. Known recent bilateral parietooccipital and right cerebellar infarcts better appreciated on prior exams. 2. No evidence of acute intracranial hemorrhage or new acute infarct. CTA neck: The common carotid, internal carotid and vertebral arteries are patent within the neck without significant stenosis. CTA head: 1. Unchanged appearance of the intracranial arterial circulation as compared to CTA head 05/27/2019. Redemonstrated stent extending from the mid basilar artery into the P2 left PCA. As before, there is decreased caliber of the left P2 segment just beyond the stent, likely exaggerated by artifact. 2. The remaining branch vessels of the circle-of-Willis are unremarkable without proximal occlusion or high-grade proximal stenosis. Electronically Signed   By: Jackey Loge DO   On: 07/03/2019 14:21   CT HEAD WO CONTRAST  Result Date: 07/02/2019 CLINICAL DATA:  Possible stroke, focal neuro deficit, greater than 6 hours, stroke suspected. Additional history provided: Lapses and memory, difficulty performing tasks, headache EXAM: CT HEAD WITHOUT CONTRAST TECHNIQUE: Contiguous axial images were obtained from the base of the skull through the vertex without intravenous contrast. COMPARISON:  Head CT examinations 06/28/2019 and earlier, brain MRI 06/27/2019, CT angiogram head 06/26/2019 FINDINGS: Brain: Known recent bilateral parietooccipital and right cerebellar infarcts were better appreciated on prior examinations. No CT evidence of new acute infarct. Previously demonstrated subarachnoid and intraventricular hemorrhage is no longer appreciated. No evidence of intracranial mass. No midline shift or extra-axial fluid collection. Cerebral volume is normal for age. Vascular:  Redemonstrated stent extending from the mid basilar artery into the left posterior cerebral artery. No vascular calcifications. Skull: Normal. Negative for fracture or focal lesion. Sinuses/Orbits: Visualized orbits demonstrate no acute abnormality. No significant paranasal sinus disease or mastoid effusion at the imaged levels. IMPRESSION: Known recent bilateral parietooccipital and right cerebellar infarcts were better appreciated on prior examinations. No  CT evidence of new acute infarct. Consider brain MRI for further evaluation, as clinically warranted. Previously demonstrated subarachnoid and intraventricular hemorrhage is no longer appreciated. Redemonstrated basilar artery/left posterior cerebral artery stent. Electronically Signed   By: Jackey LogeKyle  Golden DO   On: 07/02/2019 18:15   CT Angio Neck W and/or Wo Contrast  Result Date: 07/03/2019 CLINICAL DATA:  Subarachnoid hemorrhage [facility] follow-up. EXAM: CT ANGIOGRAPHY HEAD AND NECK TECHNIQUE: Multidetector CT imaging of the head and neck was performed using the standard protocol during bolus administration of intravenous contrast. Multiplanar CT image reconstructions and MIPs were obtained to evaluate the vascular anatomy. Carotid stenosis measurements (when applicable) are obtained utilizing NASCET criteria, using the distal internal carotid diameter as the denominator. CONTRAST:  75mL OMNIPAQUE IOHEXOL 350 MG/ML SOLN COMPARISON:  CT head performed 1 day earlier 07/02/2019, head CT 06/28/2019, head CT 06/27/2019, brain MRI 06/27/2019, CT angiogram head/neck 06/26/2019. FINDINGS: CT HEAD FINDINGS Brain: Known recent bilateral parietooccipital and right cerebellar infarcts were better appreciated on prior examinations. No CT evidence of new acute infarct. No evidence of acute intracranial hemorrhage. No evidence of internal mass. No midline shift or extra-axial fluid collection. Vascular: Reported separately Skull: Normal. Negative for fracture or focal  lesion. Sinuses: Minimal ethmoid sinus mucosal thickening. No significant mastoid effusion. Orbits: Visualized orbits demonstrate no acute abnormality. Review of the MIP images confirms the above findings CTA NECK FINDINGS Aortic arch: Standard aortic branching. Right carotid system: CCA and ICA patent within the neck without stenosis. Left carotid system: CCA and ICA patent within the neck without stenosis. Vertebral arteries: The vertebral arteries are codominant and patent throughout the neck without significant stenosis. Skeleton: No acute bony abnormality. Other neck: No soft tissue neck mass or cervical lymphadenopathy. Upper chest: No consolidation within the imaged lung apices. Review of the MIP images confirms the above findings CTA HEAD FINDINGS Anterior circulation: The intracranial internal carotid arteries are patent without significant stenosis. The bilateral middle and anterior cerebral arteries are patent without proximal branch occlusion or proximal high-grade stenosis. Hypoplastic A1 left anterior cerebral artery. Redemonstrated 2 mm infundibulum projecting inferiorly from the supraclinoid right internal carotid artery. No anterior circulation aneurysm is identified. Posterior circulation: The intracranial vertebral arteries are patent without significant stenosis. The vertebrobasilar junction is normal. Redemonstrated stent visualized from the mid basilar artery extending into the left posterior cerebral artery P2 segment. Unchanged, there is decreased caliber of the P2 left posterior cerebral artery just distal to the stent. This appearance is likely exaggerated due to artifact. Distal to this, no occlusion or high-grade stenosis is demonstrated within the P2 left PCA. The right posterior cerebral artery is patent without significant proximal stenosis. Venous sinuses: Within limitations of contrast timing, no convincing thrombus. Anatomic variants: As described Review of the MIP images confirms  the above findings IMPRESSION: CT head: 1. Known recent bilateral parietooccipital and right cerebellar infarcts better appreciated on prior exams. 2. No evidence of acute intracranial hemorrhage or new acute infarct. CTA neck: The common carotid, internal carotid and vertebral arteries are patent within the neck without significant stenosis. CTA head: 1. Unchanged appearance of the intracranial arterial circulation as compared to CTA head 05/27/2019. Redemonstrated stent extending from the mid basilar artery into the P2 left PCA. As before, there is decreased caliber of the left P2 segment just beyond the stent, likely exaggerated by artifact. 2. The remaining branch vessels of the circle-of-Willis are unremarkable without proximal occlusion or high-grade proximal stenosis. Electronically Signed   By: Ronaldo MiyamotoKyle  Golden DO   On: 07/03/2019 14:21   MR BRAIN WO CONTRAST  Result Date: 07/03/2019 CLINICAL DATA:  Neuro deficits, subacute. Stroke follow-up. Difficulty expressing thoughts and difficulty with recall. 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 EXAM: MRI HEAD WITHOUT CONTRAST TECHNIQUE: Multiplanar, multiecho pulse sequences of the brain and surrounding structures were obtained without intravenous contrast. COMPARISON:  Head CT/CTA 07/03/2019 and MRI 06/27/2019 FINDINGS: Brain: There is decreased diffusion weighted signal abnormality associated with the right cerebellar, bilateral occipital, and bilateral parietal infarcts on the prior MRI which are now subacute in appearance. A small infarct in the left cerebellum is new from the prior MRI although it also has an early subacute appearance. No definite residual intraventricular subarachnoid hemorrhage is identified. There is no extra-axial fluid collection or intracranial mass effect. The ventricles and sulci are normal. Vascular: Major intracranial vascular flow voids are preserved. Susceptibility artifact related to the  stent extending from the basilar artery into the left PCA. Skull and upper cervical spine: Unremarkable bone marrow signal. Sinuses/Orbits: Unremarkable orbits. Paranasal sinuses and mastoid air cells are clear. Other: None. IMPRESSION: 1. Small early subacute left cerebellar infarct, new from the 06/27/2019 MRI. 2. Expected evolution of subacute bilateral parietal, occipital, and right cerebellar infarcts. 3. Resolved intraventricular and subarachnoid hemorrhage. Electronically Signed   By: Sebastian Ache M.D.   On: 07/03/2019 19:06   Review of imaging obtained in the ED today:  CT head: 1. Known recent bilateral parietooccipital and right cerebellar infarcts better appreciated on prior exams. 2. No evidence of acute intracranial hemorrhage or new acute infarct.  CTA neck: The common carotid, internal carotid and vertebral arteries are patent within the neck without significant stenosis.  CTA head: 1. Unchanged appearance of the intracranial arterial circulation as compared to CTA head 05/27/2019. Redemonstrated stent extending from the mid basilar artery into the P2 left PCA. As before, there is decreased caliber of the left P2 segment just beyond the stent, likely exaggerated by artifact. 2. The remaining branch vessels of the circle-of-Willis are unremarkable without proximal occlusion or high-grade proximal stenosis.  MRI brain: 1. Small early subacute left cerebellar infarct, new from the 06/27/2019 MRI. 2. Expected evolution of subacute bilateral parietal, occipital, and right cerebellar infarcts. 3. Resolved intraventricular and subarachnoid hemorrhage  Assessment: 28 year old male with recent strokes following basilar artery stenting for a large basilar tip aneurysm. Re-presents with small early subacute left cerebellar infarct, new from the 06/27/2019 MRI. 1.  Most likely etiology for the stroke is partial compliance with ASA and Plavix in the setting of a new basilar  artery stent.  2. Anxiety. This is significantly affecting his level of functioning at home.   Recommendations: 1. Will need to be admitted for new left cerebellar stroke. Most likely etiology for the stroke is partial compliance with ASA and Plavix in the setting of a new basilar artery stent.  2. Continue ASA 81 mg po qd and Plavix 75 mg po qd. Educated patient regarding the importance of taking both of these medications every day without missing any doses, to prevent stent restenosis. Patient expressed understanding and agreement with the plan.  3. Starting Buspar 5 mg po BID. Patient educated regarding this anxiolytic. Advised that it requires about 2-3 weeks for anxiolytic effect to manifest and that it is not to be taken PRN.  4. Stroke team to follow in the AM.   Electronically signed: Dr. Caryl Pina  07/03/2019, 7:32 PM

## 2019-07-03 NOTE — ED Triage Notes (Addendum)
Per pt he was here today and left bc of ride.pt had brain surgery on 12/23 and left ama from hospital 1 day later. Now pt is waking up during the night and cant remember anything and pt complaining of numbness and tingling in his hands and headaches, Pt said he does not feel right. No blurred vision.Cant remember his surgery or what is going on. Pt said he is scared. Pt had labs and CT today here in the ED

## 2019-07-03 NOTE — ED Notes (Signed)
PA at bedside.

## 2019-07-03 NOTE — ED Provider Notes (Signed)
Signout from [employer] at shift change.  Please see his note for details.  After discussion with neurology, repeat MRI ordered to reevaluate after recent complication from basilar aneurysm coiling.  MRI performed and shows progression of previous strokes with a new left cerebellar stroke.  I called Dr. Cheral Marker who will consult on patient.  He feels that patient would likely best be served by readmission to the hospital to ensure medication compliance and also to determine if he requires any rehabilitation.  Patient updated on results.  He is less than enthusiastic about being readmitted to the hospital, but is willing to wait and talk with neurologist.  10:35 PM Reviewed neurology consult note.  Patient will be admitted.  I spoke with Dr. Hal Hope who will see patient.  BP 124/87   Pulse 68   Temp 98.3 F (36.8 C)   Resp (!) 28   Ht 5\' 11"  (1.803 m)   Wt 73.9 kg   SpO2 100%   BMI 22.73 kg/m     Carlisle Cater, PA-C 07/03/19 2235    Quintella Reichert, MD 07/04/19 [phone]

## 2019-07-03 NOTE — ED Notes (Signed)
ED happy meal provided to pt per PA request.

## 2019-07-03 NOTE — ED Notes (Signed)
No answer for VS 

## 2019-07-03 NOTE — ED Notes (Signed)
Neurology at the bedside

## 2019-07-03 NOTE — ED Notes (Signed)
Patient transported to MRI 

## 2019-07-04 ENCOUNTER — Encounter (HOSPITAL_COMMUNITY): Payer: Self-pay | Admitting: Internal Medicine

## 2019-07-04 DIAGNOSIS — Z9114 Patient's other noncompliance with medication regimen: Secondary | ICD-10-CM | POA: Diagnosis not present

## 2019-07-04 DIAGNOSIS — F1491 Cocaine use, unspecified, in remission: Secondary | ICD-10-CM | POA: Diagnosis present

## 2019-07-04 DIAGNOSIS — D696 Thrombocytopenia, unspecified: Secondary | ICD-10-CM | POA: Diagnosis present

## 2019-07-04 DIAGNOSIS — F419 Anxiety disorder, unspecified: Secondary | ICD-10-CM | POA: Diagnosis present

## 2019-07-04 DIAGNOSIS — I671 Cerebral aneurysm, nonruptured: Secondary | ICD-10-CM | POA: Diagnosis not present

## 2019-07-04 DIAGNOSIS — I615 Nontraumatic intracerebral hemorrhage, intraventricular: Secondary | ICD-10-CM | POA: Diagnosis present

## 2019-07-04 DIAGNOSIS — R29701 NIHSS score 1: Secondary | ICD-10-CM | POA: Diagnosis present

## 2019-07-04 DIAGNOSIS — Z791 Long term (current) use of non-steroidal anti-inflammatories (NSAID): Secondary | ICD-10-CM | POA: Diagnosis not present

## 2019-07-04 DIAGNOSIS — I10 Essential (primary) hypertension: Secondary | ICD-10-CM

## 2019-07-04 DIAGNOSIS — Z833 Family history of diabetes mellitus: Secondary | ICD-10-CM | POA: Diagnosis not present

## 2019-07-04 DIAGNOSIS — F17210 Nicotine dependence, cigarettes, uncomplicated: Secondary | ICD-10-CM | POA: Diagnosis present

## 2019-07-04 DIAGNOSIS — I609 Nontraumatic subarachnoid hemorrhage, unspecified: Secondary | ICD-10-CM | POA: Diagnosis present

## 2019-07-04 DIAGNOSIS — F1410 Cocaine abuse, uncomplicated: Secondary | ICD-10-CM

## 2019-07-04 DIAGNOSIS — F1990 Other psychoactive substance use, unspecified, uncomplicated: Secondary | ICD-10-CM | POA: Diagnosis present

## 2019-07-04 DIAGNOSIS — D649 Anemia, unspecified: Secondary | ICD-10-CM | POA: Diagnosis present

## 2019-07-04 DIAGNOSIS — I639 Cerebral infarction, unspecified: Secondary | ICD-10-CM | POA: Diagnosis present

## 2019-07-04 DIAGNOSIS — Z7982 Long term (current) use of aspirin: Secondary | ICD-10-CM | POA: Diagnosis not present

## 2019-07-04 DIAGNOSIS — H547 Unspecified visual loss: Secondary | ICD-10-CM | POA: Diagnosis present

## 2019-07-04 DIAGNOSIS — Z79899 Other long term (current) drug therapy: Secondary | ICD-10-CM | POA: Diagnosis not present

## 2019-07-04 DIAGNOSIS — K219 Gastro-esophageal reflux disease without esophagitis: Secondary | ICD-10-CM | POA: Diagnosis present

## 2019-07-04 DIAGNOSIS — Z20828 Contact with and (suspected) exposure to other viral communicable diseases: Secondary | ICD-10-CM | POA: Diagnosis present

## 2019-07-04 DIAGNOSIS — Z8349 Family history of other endocrine, nutritional and metabolic diseases: Secondary | ICD-10-CM | POA: Diagnosis not present

## 2019-07-04 DIAGNOSIS — F1423 Cocaine dependence with withdrawal: Secondary | ICD-10-CM | POA: Diagnosis present

## 2019-07-04 DIAGNOSIS — Z87898 Personal history of other specified conditions: Secondary | ICD-10-CM | POA: Diagnosis present

## 2019-07-04 LAB — CBC
HCT: 36.2 % — ABNORMAL LOW (ref 39.0–52.0)
Hemoglobin: 12 g/dL — ABNORMAL LOW (ref 13.0–17.0)
MCH: 28 pg (ref 26.0–34.0)
MCHC: 33.1 g/dL (ref 30.0–36.0)
MCV: 84.4 fL (ref 80.0–100.0)
Platelets: 133 10*3/uL — ABNORMAL LOW (ref 150–400)
RBC: 4.29 MIL/uL (ref 4.22–5.81)
RDW: 12.5 % (ref 11.5–15.5)
WBC: 3.8 10*3/uL — ABNORMAL LOW (ref 4.0–10.5)
nRBC: 0 % (ref 0.0–0.2)

## 2019-07-04 LAB — CREATININE, SERUM
Creatinine, Ser: 0.63 mg/dL (ref 0.61–1.24)
GFR calc Af Amer: >60 mL/min (ref >60)
GFR calc non Af Amer: >60 mL/min (ref >60)

## 2019-07-04 LAB — HIV ANTIBODY (ROUTINE TESTING W REFLEX): HIV Screen 4th Generation wRfx: REACTIVE — AB

## 2019-07-04 LAB — PLATELET INHIBITION P2Y12: Platelet Function  P2Y12: 23 [PRU] — ABNORMAL LOW (ref 182–335)

## 2019-07-04 MED ORDER — ACETAMINOPHEN 650 MG RE SUPP: 650.0000 mg | RECTAL | Status: DC | PRN

## 2019-07-04 MED ORDER — ATORVASTATIN CALCIUM 40 MG PO TABS: 40.0000 mg | ORAL_TABLET | Freq: Every day | ORAL | 0 refills | Status: AC

## 2019-07-04 MED ORDER — ACETAMINOPHEN 325 MG PO TABS
650.0000 mg | ORAL_TABLET | ORAL | Status: DC | PRN
  Administered 2019-07-04 (×2): 650 mg via ORAL
  Filled 2019-07-04 (×2): qty 2

## 2019-07-04 MED ORDER — BUSPIRONE HCL 5 MG PO TABS: 5.0000 mg | ORAL_TABLET | Freq: Two times a day (BID) | ORAL | 1 refills | Status: DC

## 2019-07-04 MED ORDER — ATORVASTATIN CALCIUM 40 MG PO TABS: 40.0000 mg | ORAL_TABLET | Freq: Every day | ORAL | Status: DC

## 2019-07-04 MED ORDER — ENOXAPARIN SODIUM 40 MG/0.4ML ~~LOC~~ SOLN
40.0000 mg | SUBCUTANEOUS | Status: DC
  Administered 2019-07-04: 40 mg via SUBCUTANEOUS
  Filled 2019-07-04: qty 0.4

## 2019-07-04 MED ORDER — ACETAMINOPHEN 160 MG/5ML PO SOLN: 650.0000 mg | ORAL | Status: DC | PRN

## 2019-07-04 MED ORDER — STROKE: EARLY STAGES OF RECOVERY BOOK
Freq: Once | Status: AC
  Filled 2019-07-04: qty 1

## 2019-07-04 NOTE — Evaluation (Signed)
Occupational Therapy Evaluation Patient Details Name: Jacob Holder MRN: [id number] DOB: [birth year] Today's Date: 07/04/2019    History of Present Illness Pt is a 28 y.o. 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 Following coiling, pt developed acute vision loss and confusion. MRI at the time showing scattered acute infarcts in the bilateral parietal lobes, left occipital lobe and right cerebellum in addition to [facility] and IVH. Pt signed out AMA. Pt readmitted 07/03/2019 with memory issues and headache. 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    Clinical Impression   Pt recently admitted and left AMA, no returned with new onset of symptoms with L cerebellar stroke. At time of eval, pt is able to complete bed mobility and transfers independently. No LOB noted throughout functional mobility. Noted pt to have difficulty locating objects in his R visual field without cues to head turn. He shares that his vision bothers him when he is looking far away. Education given on compensatory techniques for BADL/IADL safety implications. Recommend pt f/u with OP OT to continue visual compensatory techniques. Will continue to follow acutely per POC listed below.    Follow Up Recommendations  Outpatient OT;Other (comment)(neuro for vision)    Equipment Recommendations  None recommended by OT    Recommendations for Other Services       Precautions / Restrictions Precautions Precautions: Fall Restrictions Weight Bearing Restrictions: No      Mobility Bed Mobility Overal bed mobility: Independent                Transfers Overall transfer level: Independent Equipment used: None                  Balance Overall balance assessment: No apparent balance deficits (not formally assessed)                                         ADL either performed or assessed with clinical judgement   ADL Overall  ADL's : Needs assistance/impaired Eating/Feeding: Set up;Sitting   Grooming: Min guard;Standing   Upper Body Bathing: Set up;Sitting   Lower Body Bathing: Set up;Sit to/from stand   Upper Body Dressing : Set up;Sitting   Lower Body Dressing: Set up;Sit to/from stand   Toilet Transfer: Min guard           Functional mobility during ADLs: Min guard General ADL Comments: pt with visual deficits affecting all adls.      Vision Baseline Vision/History: Wears glasses Wears Glasses: At all times Patient Visual Report: Blurring of vision Vision Assessment?: Vision impaired- to be further tested in functional context;Yes Tracking/Visual Pursuits: Decreased smoothness of eye movement to RIGHT inferior field;Requires cues, head turns, or add eye shifts to track;Impaired - to be further tested in functional context Visual Fields: Right visual field deficit Additional Comments: Pt having difficulty recognizing objects in R visual field with two trials of periphery testing     Perception     Praxis      Pertinent Vitals/Pain Pain Assessment: No/denies pain     Hand Dominance     Extremity/Trunk Assessment Upper Extremity Assessment Upper Extremity Assessment: Overall WFL for tasks assessed   Lower Extremity Assessment Lower Extremity Assessment: RLE deficits/detail;LLE deficits/detail RLE Deficits / Details: Strength 5/5 LLE Deficits / Details: Strength 5/5   Cervical / Trunk Assessment Cervical / Trunk Assessment: Normal  Communication Communication Communication: No difficulties   Cognition Arousal/Alertness: Awake/alert Behavior During Therapy: WFL for tasks assessed/performed Overall Cognitive Status: Impaired/Different from baseline Area of Impairment: Safety/judgement;Problem solving;Memory                     Memory: Decreased short-term memory   Safety/Judgement: Decreased awareness of safety;Decreased awareness of deficits   Problem Solving: Slow  processing;Requires verbal cues General Comments: 1/3 short term memory recall. pt with decreased awareness of deficits and overall health and safety. He requires some additional cues with slow processing in multii step tasks. 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 Pt must manage 20 calls in 4 hours      Prior Functioning/Environment Level of Independence: Independent                 OT Problem List: Decreased strength;Decreased activity tolerance;Impaired balance (sitting and/or standing);Impaired vision/perception;Decreased coordination;Decreased cognition;Decreased safety awareness;Decreased knowledge of use of DME or AE;Decreased knowledge of precautions      OT Treatment/Interventions: Self-care/ADL training;Therapeutic exercise;Neuromuscular education;DME and/or AE instruction;Therapeutic activities;Cognitive remediation/compensation;Visual/perceptual remediation/compensation    OT Goals(Current goals can be found in the care plan section) Acute Rehab OT Goals Patient Stated Goal: to go back to work OT Goal Formulation: With patient Time For Goal Achievement: 07/18/19 Potential to Achieve Goals: Good  OT Frequency: Min 3X/week   Barriers to D/C:            Co-evaluation PT/OT/SLP Co-Evaluation/Treatment: Yes Reason for Co-Treatment: To  address functional/ADL transfers PT goals addressed during session: Mobility/safety with mobility OT goals addressed during session: ADL's and self-care;Other (comment)(vision with BADL)      AM-PAC OT "6 Clicks" Daily Activity     Outcome Measure Help from another person eating meals?: A Little Help from another person taking care of personal grooming?: A Little Help from another person toileting, which includes using toliet, bedpan, or urinal?: A Little Help from another person bathing (including washing, rinsing, drying)?: A Little Help from another person to put on and taking off regular upper body clothing?: A Little Help from another person to put on and taking off regular lower body clothing?: A Little 6 Click Score: 18   End of Session Equipment Utilized During Treatment: Gait belt;Rolling walker Nurse Communication: Mobility status  Activity Tolerance: Patient tolerated treatment well Patient left: Other (comment)(in hallway with PT)  OT Visit Diagnosis: Low vision, both eyes (H54.2);Other symptoms and signs involving cognitive function                Time: 1035-1049 OT Time Calculation (min): 14 min Charges:  OT General Charges $OT Visit: 1 Visit OT Evaluation $OT Eval Low Complexity: 1 Low  Zenovia Jarred, MSOT, OTR/L [place] OT/ Acute Relief OT [facility] Office: [place] 07/04/2019, 1:35 PM

## 2019-07-04 NOTE — Progress Notes (Signed)
Pt admitted to the unit from ED. Pt A&O x4; MAE x4; denies any pain or discomfort; skin clean, dry and intact with no pressure ulcer or opened wound noted. Pt oriented to the unit and room; fall/safety precaution and prevention education completed. Pt watch TV in bed with call light within reach. VSS; Will continue to closely monitor. Delia Heady RN   07/04/19 0013  Vitals  Temp 98 F (36.7 C)  Temp Source Oral  BP 125/87  MAP (mmHg) 98  BP Location Right Arm  BP Method Automatic  Patient Position (if appropriate) Sitting  Pulse Rate 76  Pulse Rate Source Monitor  Resp 18  Oxygen Therapy  SpO2 100 %  O2 Device Room Air  Pain Assessment  Pain Scale 0-10  Pain Score 0  MEWS Score  MEWS RR 0  MEWS Pulse 0  MEWS Systolic 0  MEWS LOC 0  MEWS Temp 0  MEWS Score 0  MEWS Score Color Green

## 2019-07-04 NOTE — Plan of Care (Signed)
Patient stated "I understand how important it is quit smoking and take my medication now". Patient progressing towards care plan goal.

## 2019-07-04 NOTE — TOC Transition Note (Signed)
Transition of Care [facility]) - CM/SW Discharge Note   Patient Details  Name: Jacob Holder MRN: [id number] Date of Birth: 1991/02/14  Transition of [facility]) CM/SW Contact:  Geralynn Ochs, LCSW Phone Number: 07/04/2019, 2:10 PM   Clinical Narrative:   CSW met with patient to discuss discharge home. Patient agreeable to outpatient OT, referral sent to Neurorehab. Patient does not have a PCP, requested getting one set up. CSW attempted to call [facility] [place], and offices are closed for the holiday. CSW placed instructions for patient to call [facility] on Monday to schedule an appointment after the holiday and patient agreed. Patient discussed no issues with transportation or obtaining his meds. No further needs at this time, signing off.    Final next level of care: Home/Self Care Barriers to Discharge: Barriers Resolved   Patient Goals and CMS Choice Patient states their goals for this hospitalization and ongoing recovery are:: to get home CMS Medicare.gov Compare Post Acute Care list provided to:: Patient Choice offered to / list presented to : Patient  Discharge Placement                Patient to be transferred to facility by: Family car Name of family member notified: Self Patient and family notified of of transfer: 07/04/19  Discharge Plan and Services                                     Social Determinants of Health (SDOH) Interventions     Readmission Risk Interventions No flowsheet data found.

## 2019-07-04 NOTE — H&P (Signed)
History and Physical    Jacob Holder WLN:[id number] DOB: 1990/12/25 DOA: 07/03/2019  PCP: Patient, No Pcp Per  Patient coming from: Home.  Chief Complaint: Forgetfulness.  Numbness of the extremities.  HPI: Jacob Holder is a 28 y.o. 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  Denies any weakness of extremities or any difficulty swallowing or speaking.  No new visual disturbances.  ED Course: In the ER MRI of the brain shows new infarct involving the left cerebellum.  CT angiogram of the head and neck also was done.  EKG shows normal sinus rhythm Covid test was negative.  On-call neurologist Dr. Cheral Marker was consulted and patient admitted for further management and observation.  Labs show creatinine 1.6 hemoglobin 12.3 WBC 3.5 platelets 127.  Review of Systems: As per HPI, rest all negative.   Past Medical History:  Diagnosis Date  . Aneurysm ([place])   . Anxiety   . Depression   . GERD (gastroesophageal reflux disease)   . Headache   . Wears contact lenses     Past Surgical History:  Procedure Laterality Date  . IR ANGIO INTRA EXTRACRAN SEL COM CAROTID INNOMINATE BILAT MOD SED  03/05/2019  . IR ANGIO VERTEBRAL SEL VERTEBRAL BILAT MOD SED  03/05/2019  . IR ANGIO VERTEBRAL SEL VERTEBRAL UNI R MOD SED  06/26/2019  . IR ANGIOGRAM FOLLOW UP STUDY  06/26/2019  . IR NEURO EACH ADD'L AFTER BASIC UNI RIGHT (MS)  06/26/2019  . IR  TRANSCATH/EMBOLIZ  06/26/2019  . RADIOLOGY WITH ANESTHESIA N/A 06/26/2019   Procedure: EMBOLIZATION;  Surgeon: Luanne Bras, MD;  Location: [facility];  Service: Radiology;  Laterality: N/A;  . RECTAL SURGERY    . TOOTH EXTRACTION       reports that he has been smoking cigarettes. He has been smoking about 0.50 packs per day. He has never used smokeless tobacco. He reports current alcohol use. He reports that he does not use drugs.  No Known Allergies  Family History  Problem Relation Age of Onset  . Healthy Mother   . Thyroid disease Mother   . Diabetes Father     Prior to Admission medications   Medication Sig Start Date End Date Taking? Authorizing Provider  aspirin 81 MG chewable tablet Chew 81 mg by mouth daily.   Yes [provider]  clopidogrel (PLAVIX) 75 MG tablet Take 75 mg by mouth daily.    Yes [provider]  ibuprofen (ADVIL) 200 MG tablet Take 600 mg by mouth every 6 (six) hours as needed for headache.   Yes [provider]  verapamil (CALAN) 80 MG tablet Take 1 tablet (80 mg total) by mouth 3 (three) times daily. 02/20/19  Yes Pieter Partridge, DO    Physical Exam: Constitutional: Moderately built and nourished. Vitals:   07/03/19 2200 07/03/19 2230 07/03/19 2245 07/04/19 0013  BP: 124/87 113/70 111/66 125/87  Pulse: 68 68 (!) 25 76  Resp: (!) 28 (!) 24 (!) 22  18  Temp:    98 F (36.7 C)  TempSrc:    Oral  SpO2: 100% 99% 100% 100%  Weight:      Height:       Eyes: Anicteric no pallor. ENMT: No discharge from the ears eyes nose or mouth. Neck: No mass felt.  No neck rigidity. Respiratory: No rhonchi or crepitations. Cardiovascular: S1-S2 heard. Abdomen: Soft nontender bowel sounds present. Musculoskeletal: No edema. Skin: No rash. Neurologic: Alert awake oriented to time place and person.  Moves all extremities 5 x 5.  No facial asymmetry tongue is midline pupils equal reactive light. Psychiatric: Appears normal per normal  affect.   Labs on Admission: I have personally reviewed following labs and imaging studies  CBC: Recent Labs  Lab 06/28/19 0501 06/29/19 0548 06/30/19 0655 07/02/19 1547 07/03/19 1148  WBC 5.5 5.3 5.1 4.4 3.5*  NEUTROABS  --  3.3  --  2.5 1.7  HGB 13.4 11.6* 12.1* 12.4* 12.3*  HCT 40.8 34.9* 36.2* 38.4* 37.8*  MCV 84.8 82.7 83.6 86.9 87.3  PLT 88* 89* 95* 125* 127*   Basic Metabolic Panel: Recent Labs  Lab 06/27/19 0252 06/28/19 0501 06/29/19 0548 06/30/19 0655 07/02/19 1547 07/03/19 1148  NA 137 140 140 138 139 139  K 3.9 3.6 3.3* 3.8 3.6 4.1  CL 104 104 106 110 104 105  CO2 23 22 21* GLUCOSE 96 86 81 119* 86 89  BUN 5* 7 6 5* <5* 8  CREATININE 0.71 0.81 0.86 0.77 0.82 0.68  CALCIUM 8.6* 8.5* 7.8* 8.3* 8.4* 8.6*  MG 1.9  --   --   --   --   --   PHOS 2.6  --   --   --   --   --    GFR: Estimated Creatinine Clearance: 141.1 mL/min (by C-G formula based on SCr of 0.68 mg/dL). Liver Function Tests: Recent Labs  Lab 06/29/19 0548 07/02/19 1547 07/03/19 1148  AST 30 45* 42*  ALT 21 49* 48*  ALKPHOS 38 55 57  BILITOT 1.1 0.4 0.8  PROT 7.2 8.5* 8.4*  ALBUMIN 2.9* 3.4* 3.2*   No results for input(s): LIPASE, AMYLASE in the last 168 hours. No results for input(s): AMMONIA in the last 168 hours. Coagulation Profile: Recent Labs  Lab 07/02/19 1547  INR 1.0   Cardiac Enzymes: No results for input(s): CKTOTAL, CKMB, CKMBINDEX, TROPONINI in the last 168 hours. BNP (last 3 results) No results for input(s): PROBNP in the last 8760 hours. HbA1C: No results for input(s): HGBA1C in the last 72 hours. CBG: No results for input(s): GLUCAP in the last 168 hours. Lipid Profile: No results for input(s): CHOL, HDL, LDLCALC, TRIG, CHOLHDL, LDLDIRECT in the last 72 hours. Thyroid Function Tests: No results for input(s): TSH, T4TOTAL, FREET4, T3FREE, THYROIDAB in the last 72 hours. Anemia Panel: No results for input(s): VITAMINB12, FOLATE, FERRITIN, TIBC,  IRON, RETICCTPCT in the last 72 hours. Urine analysis:    Component Value Date/Time   COLORURINE YELLOW 07/03/2019 1140   APPEARANCEUR CLEAR 07/03/2019 1140   LABSPEC 1.020 07/03/2019 1140   PHURINE 6.0 07/03/2019 1140   GLUCOSEU NEGATIVE 07/03/2019 1140   HGBUR NEGATIVE 07/03/2019 1140   BILIRUBINUR NEGATIVE 07/03/2019 1140   KETONESUR NEGATIVE 07/03/2019 1140   PROTEINUR NEGATIVE 07/03/2019 1140   NITRITE NEGATIVE 07/03/2019 1140   LEUKOCYTESUR NEGATIVE 07/03/2019 1140   Sepsis Labs: (procalcitonin:4,lacticidven:4) ) Recent Results (from the past 240 hour(s))  MRSA PCR Screening  Status: None   Collection Time: 06/26/19  6:20 PM   Specimen: Nasal Mucosa; Nasopharyngeal  Result Value Ref Range Status   MRSA by PCR NEGATIVE NEGATIVE Final    Comment:        The GeneXpert MRSA Assay (FDA approved for NASAL specimens only), is one component of a comprehensive MRSA colonization surveillance program. It is not intended to diagnose MRSA infection nor to guide or monitor treatment for MRSA infections. Performed at [facility], [address] [place], [place] [address]   SARS CORONAVIRUS 2 (TAT 6-24 HRS) Nasopharyngeal Nasopharyngeal Swab     Status: None   Collection Time: 07/03/19  4:03 PM   Specimen: Nasopharyngeal Swab  Result Value Ref Range Status   SARS Coronavirus 2 NEGATIVE NEGATIVE Final    Comment: (NOTE) SARS-CoV-2 target nucleic acids are NOT DETECTED. The SARS-CoV-2 RNA is generally detectable in upper and lower respiratory specimens during the acute phase of infection. Negative results do not preclude SARS-CoV-2 infection, do not rule out co-infections with other pathogens, and should not be used as the sole basis for treatment or other patient management decisions. Negative results must be combined with clinical observations, patient history, and epidemiological information. The expected result is Negative. Fact Sheet for  Patients: HairSlick.no Fact Sheet for Healthcare Providers: quierodirigir.com This test is not yet approved or cleared by the [place] FDA and  has been authorized for detection and/or diagnosis of SARS-CoV-2 by FDA under an Emergency Use Authorization (EUA). This EUA will remain  in effect (meaning this test can be used) for the duration of the COVID-19 declaration under Section 56 4(b)(1) of the Act, 21 U.S.C. section 360bbb-3(b)(1), unless the authorization is terminated or revoked sooner. Performed at [facility], [address], [place], [place] [address]      Radiological Exams on Admission: CT Angio Head W or Wo Contrast  Result Date: 07/03/2019 CLINICAL DATA:  Subarachnoid hemorrhage [facility]), follow-up. EXAM: CT ANGIOGRAPHY HEAD AND NECK TECHNIQUE: Multidetector CT imaging of the head and neck was performed using the standard protocol during bolus administration of intravenous contrast. Multiplanar CT image reconstructions and MIPs were obtained to evaluate the vascular anatomy. Carotid stenosis measurements (when applicable) are obtained utilizing NASCET criteria, using the distal internal carotid diameter as the denominator. CONTRAST:  64mL OMNIPAQUE IOHEXOL 350 MG/ML SOLN COMPARISON:  CT head performed 1 day earlier 07/02/2019, head CT 06/28/2019, head CT 06/27/2019, brain MRI 06/27/2019, CT angiogram head/neck 06/26/2019. FINDINGS: CT HEAD FINDINGS Brain: Known recent bilateral parietooccipital and right cerebellar infarcts were better appreciated on prior examinations. No CT evidence of new acute infarct. No evidence of acute intracranial hemorrhage. No evidence of internal mass. No midline shift or extra-axial fluid collection. Vascular: Reported separately Skull: Normal. Negative for fracture or focal lesion. Sinuses: Minimal ethmoid sinus mucosal thickening. No significant mastoid effusion. Orbits: Visualized orbits  demonstrate no acute abnormality. Review of the MIP images confirms the above findings CTA NECK FINDINGS Aortic arch: Standard aortic branching. Right carotid system: CCA and ICA patent within the neck without stenosis. Left carotid system: CCA and ICA patent within the neck without stenosis. Vertebral arteries: The vertebral arteries are codominant and patent throughout the neck without significant stenosis. Skeleton: No acute bony abnormality. Other neck: No soft tissue neck mass or cervical lymphadenopathy. Upper chest: No consolidation within the imaged lung apices. Review of the MIP images confirms the above findings CTA HEAD FINDINGS Anterior circulation: The intracranial internal carotid arteries are patent  without significant stenosis. The bilateral middle and anterior cerebral arteries are patent without proximal branch occlusion or proximal high-grade stenosis. Hypoplastic A1 left anterior cerebral artery. Redemonstrated 2 mm infundibulum projecting inferiorly from the supraclinoid right internal carotid artery. No anterior circulation aneurysm is identified. Posterior circulation: The intracranial vertebral arteries are patent without significant stenosis. The vertebrobasilar junction is normal. Redemonstrated stent visualized from the mid basilar artery extending into the left posterior cerebral artery P2 segment. Unchanged, there is decreased caliber of the P2 left posterior cerebral artery just distal to the stent. This appearance is likely exaggerated due to artifact. Distal to this, no occlusion or high-grade stenosis is demonstrated within the P2 left PCA. The right posterior cerebral artery is patent without significant proximal stenosis. Venous sinuses: Within limitations of contrast timing, no convincing thrombus. Anatomic variants: As described Review of the MIP images confirms the above findings IMPRESSION: CT head: 1. Known recent bilateral parietooccipital and right cerebellar infarcts better  appreciated on prior exams. 2. No evidence of acute intracranial hemorrhage or new acute infarct. CTA neck: The common carotid, internal carotid and vertebral arteries are patent within the neck without significant stenosis. CTA head: 1. Unchanged appearance of the intracranial arterial circulation as compared to CTA head 05/27/2019. Redemonstrated stent extending from the mid basilar artery into the P2 left PCA. As before, there is decreased caliber of the left P2 segment just beyond the stent, likely exaggerated by artifact. 2. The remaining branch vessels of the circle-of-Willis are unremarkable without proximal occlusion or high-grade proximal stenosis. Electronically Signed   By: Jackey LogeKyle  Golden DO   On: 07/03/2019 14:21   CT HEAD WO CONTRAST  Result Date: 07/02/2019 CLINICAL DATA:  Possible stroke, focal neuro deficit, greater than 6 hours, stroke suspected. Additional history provided: Lapses and memory, difficulty performing tasks, headache EXAM: CT HEAD WITHOUT CONTRAST TECHNIQUE: Contiguous axial images were obtained from the base of the skull through the vertex without intravenous contrast. COMPARISON:  Head CT examinations 06/28/2019 and earlier, brain MRI 06/27/2019, CT angiogram head 06/26/2019 FINDINGS: Brain: Known recent bilateral parietooccipital and right cerebellar infarcts were better appreciated on prior examinations. No CT evidence of new acute infarct. Previously demonstrated subarachnoid and intraventricular hemorrhage is no longer appreciated. No evidence of intracranial mass. No midline shift or extra-axial fluid collection. Cerebral volume is normal for age. Vascular: Redemonstrated stent extending from the mid basilar artery into the left posterior cerebral artery. No vascular calcifications. Skull: Normal. Negative for fracture or focal lesion. Sinuses/Orbits: Visualized orbits demonstrate no acute abnormality. No significant paranasal sinus disease or mastoid effusion at the imaged  levels. IMPRESSION: Known recent bilateral parietooccipital and right cerebellar infarcts were better appreciated on prior examinations. No CT evidence of new acute infarct. Consider brain MRI for further evaluation, as clinically warranted. Previously demonstrated subarachnoid and intraventricular hemorrhage is no longer appreciated. Redemonstrated basilar artery/left posterior cerebral artery stent. Electronically Signed   By: Jackey LogeKyle  Golden DO   On: 07/02/2019 18:15   CT Angio Neck W and/or Wo Contrast  Result Date: 07/03/2019 CLINICAL DATA:  Subarachnoid hemorrhage [facility] follow-up. EXAM: CT ANGIOGRAPHY HEAD AND NECK TECHNIQUE: Multidetector CT imaging of the head and neck was performed using the standard protocol during bolus administration of intravenous contrast. Multiplanar CT image reconstructions and MIPs were obtained to evaluate the vascular anatomy. Carotid stenosis measurements (when applicable) are obtained utilizing NASCET criteria, using the distal internal carotid diameter as the denominator. CONTRAST:  75mL OMNIPAQUE IOHEXOL 350 MG/ML SOLN COMPARISON:  CT head  performed 1 day earlier 07/02/2019, head CT 06/28/2019, head CT 06/27/2019, brain MRI 06/27/2019, CT angiogram head/neck 06/26/2019. FINDINGS: CT HEAD FINDINGS Brain: Known recent bilateral parietooccipital and right cerebellar infarcts were better appreciated on prior examinations. No CT evidence of new acute infarct. No evidence of acute intracranial hemorrhage. No evidence of internal mass. No midline shift or extra-axial fluid collection. Vascular: Reported separately Skull: Normal. Negative for fracture or focal lesion. Sinuses: Minimal ethmoid sinus mucosal thickening. No significant mastoid effusion. Orbits: Visualized orbits demonstrate no acute abnormality. Review of the MIP images confirms the above findings CTA NECK FINDINGS Aortic arch: Standard aortic branching. Right carotid system: CCA and ICA patent within the neck without  stenosis. Left carotid system: CCA and ICA patent within the neck without stenosis. Vertebral arteries: The vertebral arteries are codominant and patent throughout the neck without significant stenosis. Skeleton: No acute bony abnormality. Other neck: No soft tissue neck mass or cervical lymphadenopathy. Upper chest: No consolidation within the imaged lung apices. Review of the MIP images confirms the above findings CTA HEAD FINDINGS Anterior circulation: The intracranial internal carotid arteries are patent without significant stenosis. The bilateral middle and anterior cerebral arteries are patent without proximal branch occlusion or proximal high-grade stenosis. Hypoplastic A1 left anterior cerebral artery. Redemonstrated 2 mm infundibulum projecting inferiorly from the supraclinoid right internal carotid artery. No anterior circulation aneurysm is identified. Posterior circulation: The intracranial vertebral arteries are patent without significant stenosis. The vertebrobasilar junction is normal. Redemonstrated stent visualized from the mid basilar artery extending into the left posterior cerebral artery P2 segment. Unchanged, there is decreased caliber of the P2 left posterior cerebral artery just distal to the stent. This appearance is likely exaggerated due to artifact. Distal to this, no occlusion or high-grade stenosis is demonstrated within the P2 left PCA. The right posterior cerebral artery is patent without significant proximal stenosis. Venous sinuses: Within limitations of contrast timing, no convincing thrombus. Anatomic variants: As described Review of the MIP images confirms the above findings IMPRESSION: CT head: 1. Known recent bilateral parietooccipital and right cerebellar infarcts better appreciated on prior exams. 2. No evidence of acute intracranial hemorrhage or new acute infarct. CTA neck: The common carotid, internal carotid and vertebral arteries are patent within the neck without  significant stenosis. CTA head: 1. Unchanged appearance of the intracranial arterial circulation as compared to CTA head 05/27/2019. Redemonstrated stent extending from the mid basilar artery into the P2 left PCA. As before, there is decreased caliber of the left P2 segment just beyond the stent, likely exaggerated by artifact. 2. The remaining branch vessels of the circle-of-Willis are unremarkable without proximal occlusion or high-grade proximal stenosis. Electronically Signed   By: Jackey Loge DO   On: 07/03/2019 14:21   MR BRAIN WO CONTRAST  Result Date: 07/03/2019 CLINICAL DATA:  Neuro deficits, subacute. Stroke follow-up. Difficulty expressing thoughts and difficulty with recall. 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 EXAM: MRI HEAD WITHOUT CONTRAST TECHNIQUE: Multiplanar, multiecho pulse sequences of the brain and surrounding structures were obtained without intravenous contrast. COMPARISON:  Head CT/CTA 07/03/2019 and MRI 06/27/2019 FINDINGS: Brain: There is decreased diffusion weighted signal abnormality associated with the right cerebellar, bilateral occipital, and bilateral parietal infarcts on the prior MRI which are now subacute in appearance. A small infarct in the left cerebellum is new from the prior MRI although it also has an early subacute appearance. No definite residual intraventricular subarachnoid hemorrhage is identified. There is no extra-axial fluid collection or intracranial  mass effect. The ventricles and sulci are normal. Vascular: Major intracranial vascular flow voids are preserved. Susceptibility artifact related to the stent extending from the basilar artery into the left PCA. Skull and upper cervical spine: Unremarkable bone marrow signal. Sinuses/Orbits: Unremarkable orbits. Paranasal sinuses and mastoid air cells are clear. Other: None. IMPRESSION: 1. Small early subacute left cerebellar infarct, new from the 06/27/2019 MRI. 2.  Expected evolution of subacute bilateral parietal, occipital, and right cerebellar infarcts. 3. Resolved intraventricular and subarachnoid hemorrhage. Electronically Signed   By: Sebastian Ache M.D.   On: 07/03/2019 19:06    EKG: Independently reviewed.  Normal sinus rhythm.  Assessment/Plan Principal Problem:   Acute CVA (cerebrovascular accident) [facility]) Active Problems:   Brain aneurysm   Normochromic normocytic anemia    1. Acute CVA -discussed with Dr. Suezanne Jacquet on-call neurologist.  At this time patient's acute infarct is attributed to patient not taking his aspirin and Plavix after his stent placement.  Closely monitor in telemetry and neurochecks.  Patient passed swallow. 2. History of hypertension on verapamil. 3. History of cocaine abuse has not used any cocaine since last admission as per the patient.  Urine drug screen is pending. 4. Chronic anemia and thrombocytopenia follow CBC.   DVT prophylaxis: Lovenox. Code Status: Full code. Family Communication: Discussed with patient. Disposition Plan: Home. Consults called: Neurology.  Physical therapy. Admission status: Observation.   Eduard Clos MD [facility] Pager [phone].  If 7PM-7AM, please contact night-coverage [email or web address] Password [email or web address]  07/04/2019, 12:59 AM

## 2019-07-04 NOTE — Progress Notes (Signed)
Telemetry applied and verified with CCMD; NT called second verified telemetry. Delia Heady RN

## 2019-07-04 NOTE — Progress Notes (Signed)
STROKE TEAM PROGRESS NOTE   INTERVAL HISTORY Pt walking in room and stated that he feels fine today. He stated that he had memory issue such as does not know how to use his cell phone since the procedure last time, however, he woke up early morning felt not right, "whole upper body numbness" "like panic attack". He called EMS, at the time in ER, symptoms all resolved. He feels good now and willing to go home today. P2Y12 23 after taking plavix last night. He stated that he did not take any meds since last discharge as he does not know he needed to take the medication everyday, now he knows and he will take meds everyday.   Vitals:   07/04/19 0013 07/04/19 0340 07/04/19 0906 07/04/19 1122  BP: 125/87 118/76 126/75 112/68  Pulse: 76 83 83 84  Resp: 18 18 18 15   Temp: 98 F (36.7 C) 98.5 F (36.9 C) 98.1 F (36.7 C) 97.9 F (36.6 C)  TempSrc: Oral Oral Oral Oral  SpO2: 100% 100% 99% 100%  Weight:      Height:        CBC:  Recent Labs  Lab 07/02/19 1547 07/03/19 1148 07/04/19 0250  WBC 4.4 3.5* 3.8*  NEUTROABS 2.5 1.7  --   HGB 12.4* 12.3* 12.0*  HCT 38.4* 37.8* 36.2*  MCV 86.9 87.3 84.4  PLT 125* 127* 133*    Basic Metabolic Panel:  Recent Labs  Lab 07/02/19 1547 07/03/19 1148 07/04/19 0250  NA 139 139  --   K 3.6 4.1  --   CL 104 105  --   CO2 28 25  --   GLUCOSE 86 89  --   BUN <5* 8  --   CREATININE 0.82 0.68 0.63  CALCIUM 8.4* 8.6*  --    Lipid Panel:     Component Value Date/Time   CHOL 133 06/28/2019 0501   TRIG 178 (H) 06/28/2019 0501   HDL 24 (L) 06/28/2019 0501   CHOLHDL 5.5 06/28/2019 0501   VLDL 36 06/28/2019 0501   LDLCALC 73 06/28/2019 0501   HgbA1c:  Lab Results  Component Value Date   HGBA1C 5.5 06/26/2019   Urine Drug Screen:     Component Value Date/Time   LABOPIA NONE DETECTED 07/03/2019 1140   COCAINSCRNUR NONE DETECTED 07/03/2019 1140   LABBENZ NONE DETECTED 07/03/2019 1140   AMPHETMU NONE DETECTED 07/03/2019 1140   THCU NONE  DETECTED 07/03/2019 1140   LABBARB NONE DETECTED 07/03/2019 1140    Alcohol Level     Component Value Date/Time   ETH <10 07/03/2019 1148    IMAGING CT Angio Head W or Wo Contrast  Result Date: 07/03/2019 CLINICAL DATA:  Subarachnoid hemorrhage [facility]), follow-up. EXAM: CT ANGIOGRAPHY HEAD AND NECK TECHNIQUE: Multidetector CT imaging of the head and neck was performed using the standard protocol during bolus administration of intravenous contrast. Multiplanar CT image reconstructions and MIPs were obtained to evaluate the vascular anatomy. Carotid stenosis measurements (when applicable) are obtained utilizing NASCET criteria, using the distal internal carotid diameter as the denominator. CONTRAST:  92mL OMNIPAQUE IOHEXOL 350 MG/ML SOLN COMPARISON:  CT head performed 1 day earlier 07/02/2019, head CT 06/28/2019, head CT 06/27/2019, brain MRI 06/27/2019, CT angiogram head/neck 06/26/2019. FINDINGS: CT HEAD FINDINGS Brain: Known recent bilateral parietooccipital and right cerebellar infarcts were better appreciated on prior examinations. No CT evidence of new acute infarct. No evidence of acute intracranial hemorrhage. No evidence of internal mass. No midline shift or extra-axial  fluid collection. Vascular: Reported separately Skull: Normal. Negative for fracture or focal lesion. Sinuses: Minimal ethmoid sinus mucosal thickening. No significant mastoid effusion. Orbits: Visualized orbits demonstrate no acute abnormality. Review of the MIP images confirms the above findings CTA NECK FINDINGS Aortic arch: Standard aortic branching. Right carotid system: CCA and ICA patent within the neck without stenosis. Left carotid system: CCA and ICA patent within the neck without stenosis. Vertebral arteries: The vertebral arteries are codominant and patent throughout the neck without significant stenosis. Skeleton: No acute bony abnormality. Other neck: No soft tissue neck mass or cervical lymphadenopathy. Upper chest:  No consolidation within the imaged lung apices. Review of the MIP images confirms the above findings CTA HEAD FINDINGS Anterior circulation: The intracranial internal carotid arteries are patent without significant stenosis. The bilateral middle and anterior cerebral arteries are patent without proximal branch occlusion or proximal high-grade stenosis. Hypoplastic A1 left anterior cerebral artery. Redemonstrated 2 mm infundibulum projecting inferiorly from the supraclinoid right internal carotid artery. No anterior circulation aneurysm is identified. Posterior circulation: The intracranial vertebral arteries are patent without significant stenosis. The vertebrobasilar junction is normal. Redemonstrated stent visualized from the mid basilar artery extending into the left posterior cerebral artery P2 segment. Unchanged, there is decreased caliber of the P2 left posterior cerebral artery just distal to the stent. This appearance is likely exaggerated due to artifact. Distal to this, no occlusion or high-grade stenosis is demonstrated within the P2 left PCA. The right posterior cerebral artery is patent without significant proximal stenosis. Venous sinuses: Within limitations of contrast timing, no convincing thrombus. Anatomic variants: As described Review of the MIP images confirms the above findings IMPRESSION: CT head: 1. Known recent bilateral parietooccipital and right cerebellar infarcts better appreciated on prior exams. 2. No evidence of acute intracranial hemorrhage or new acute infarct. CTA neck: The common carotid, internal carotid and vertebral arteries are patent within the neck without significant stenosis. CTA head: 1. Unchanged appearance of the intracranial arterial circulation as compared to CTA head 05/27/2019. Redemonstrated stent extending from the mid basilar artery into the P2 left PCA. As before, there is decreased caliber of the left P2 segment just beyond the stent, likely exaggerated by  artifact. 2. The remaining branch vessels of the circle-of-Willis are unremarkable without proximal occlusion or high-grade proximal stenosis. Electronically Signed   By: Jackey Loge DO   On: 07/03/2019 14:21   CT HEAD WO CONTRAST  Result Date: 07/02/2019 CLINICAL DATA:  Possible stroke, focal neuro deficit, greater than 6 hours, stroke suspected. Additional history provided: Lapses and memory, difficulty performing tasks, headache EXAM: CT HEAD WITHOUT CONTRAST TECHNIQUE: Contiguous axial images were obtained from the base of the skull through the vertex without intravenous contrast. COMPARISON:  Head CT examinations 06/28/2019 and earlier, brain MRI 06/27/2019, CT angiogram head 06/26/2019 FINDINGS: Brain: Known recent bilateral parietooccipital and right cerebellar infarcts were better appreciated on prior examinations. No CT evidence of new acute infarct. Previously demonstrated subarachnoid and intraventricular hemorrhage is no longer appreciated. No evidence of intracranial mass. No midline shift or extra-axial fluid collection. Cerebral volume is normal for age. Vascular: Redemonstrated stent extending from the mid basilar artery into the left posterior cerebral artery. No vascular calcifications. Skull: Normal. Negative for fracture or focal lesion. Sinuses/Orbits: Visualized orbits demonstrate no acute abnormality. No significant paranasal sinus disease or mastoid effusion at the imaged levels. IMPRESSION: Known recent bilateral parietooccipital and right cerebellar infarcts were better appreciated on prior examinations. No CT evidence of new acute infarct.  Consider brain MRI for further evaluation, as clinically warranted. Previously demonstrated subarachnoid and intraventricular hemorrhage is no longer appreciated. Redemonstrated basilar artery/left posterior cerebral artery stent. Electronically Signed   By: Jackey LogeKyle  Golden DO   On: 07/02/2019 18:15   CT Angio Neck W and/or Wo Contrast  Result  Date: 07/03/2019 CLINICAL DATA:  Subarachnoid hemorrhage [facility]), follow-up. EXAM: CT ANGIOGRAPHY HEAD AND NECK TECHNIQUE: Multidetector CT imaging of the head and neck was performed using the standard protocol during bolus administration of intravenous contrast. Multiplanar CT image reconstructions and MIPs were obtained to evaluate the vascular anatomy. Carotid stenosis measurements (when applicable) are obtained utilizing NASCET criteria, using the distal internal carotid diameter as the denominator. CONTRAST:  75mL OMNIPAQUE IOHEXOL 350 MG/ML SOLN COMPARISON:  CT head performed 1 day earlier 07/02/2019, head CT 06/28/2019, head CT 06/27/2019, brain MRI 06/27/2019, CT angiogram head/neck 06/26/2019. FINDINGS: CT HEAD FINDINGS Brain: Known recent bilateral parietooccipital and right cerebellar infarcts were better appreciated on prior examinations. No CT evidence of new acute infarct. No evidence of acute intracranial hemorrhage. No evidence of internal mass. No midline shift or extra-axial fluid collection. Vascular: Reported separately Skull: Normal. Negative for fracture or focal lesion. Sinuses: Minimal ethmoid sinus mucosal thickening. No significant mastoid effusion. Orbits: Visualized orbits demonstrate no acute abnormality. Review of the MIP images confirms the above findings CTA NECK FINDINGS Aortic arch: Standard aortic branching. Right carotid system: CCA and ICA patent within the neck without stenosis. Left carotid system: CCA and ICA patent within the neck without stenosis. Vertebral arteries: The vertebral arteries are codominant and patent throughout the neck without significant stenosis. Skeleton: No acute bony abnormality. Other neck: No soft tissue neck mass or cervical lymphadenopathy. Upper chest: No consolidation within the imaged lung apices. Review of the MIP images confirms the above findings CTA HEAD FINDINGS Anterior circulation: The intracranial internal carotid arteries are patent  without significant stenosis. The bilateral middle and anterior cerebral arteries are patent without proximal branch occlusion or proximal high-grade stenosis. Hypoplastic A1 left anterior cerebral artery. Redemonstrated 2 mm infundibulum projecting inferiorly from the supraclinoid right internal carotid artery. No anterior circulation aneurysm is identified. Posterior circulation: The intracranial vertebral arteries are patent without significant stenosis. The vertebrobasilar junction is normal. Redemonstrated stent visualized from the mid basilar artery extending into the left posterior cerebral artery P2 segment. Unchanged, there is decreased caliber of the P2 left posterior cerebral artery just distal to the stent. This appearance is likely exaggerated due to artifact. Distal to this, no occlusion or high-grade stenosis is demonstrated within the P2 left PCA. The right posterior cerebral artery is patent without significant proximal stenosis. Venous sinuses: Within limitations of contrast timing, no convincing thrombus. Anatomic variants: As described Review of the MIP images confirms the above findings IMPRESSION: CT head: 1. Known recent bilateral parietooccipital and right cerebellar infarcts better appreciated on prior exams. 2. No evidence of acute intracranial hemorrhage or new acute infarct. CTA neck: The common carotid, internal carotid and vertebral arteries are patent within the neck without significant stenosis. CTA head: 1. Unchanged appearance of the intracranial arterial circulation as compared to CTA head 05/27/2019. Redemonstrated stent extending from the mid basilar artery into the P2 left PCA. As before, there is decreased caliber of the left P2 segment just beyond the stent, likely exaggerated by artifact. 2. The remaining branch vessels of the circle-of-Willis are unremarkable without proximal occlusion or high-grade proximal stenosis. Electronically Signed   By: Jackey LogeKyle  Golden DO   On:  07/03/2019 14:21   MR BRAIN WO CONTRAST  Result Date: 07/03/2019 CLINICAL DATA:  Neuro deficits, subacute. Stroke follow-up. Difficulty expressing thoughts and difficulty with recall. 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 EXAM: MRI HEAD WITHOUT CONTRAST TECHNIQUE: Multiplanar, multiecho pulse sequences of the brain and surrounding structures were obtained without intravenous contrast. COMPARISON:  Head CT/CTA 07/03/2019 and MRI 06/27/2019 FINDINGS: Brain: There is decreased diffusion weighted signal abnormality associated with the right cerebellar, bilateral occipital, and bilateral parietal infarcts on the prior MRI which are now subacute in appearance. A small infarct in the left cerebellum is new from the prior MRI although it also has an early subacute appearance. No definite residual intraventricular subarachnoid hemorrhage is identified. There is no extra-axial fluid collection or intracranial mass effect. The ventricles and sulci are normal. Vascular: Major intracranial vascular flow voids are preserved. Susceptibility artifact related to the stent extending from the basilar artery into the left PCA. Skull and upper cervical spine: Unremarkable bone marrow signal. Sinuses/Orbits: Unremarkable orbits. Paranasal sinuses and mastoid air cells are clear. Other: None. IMPRESSION: 1. Small early subacute left cerebellar infarct, new from the 06/27/2019 MRI. 2. Expected evolution of subacute bilateral parietal, occipital, and right cerebellar infarcts. 3. Resolved intraventricular and subarachnoid hemorrhage. Electronically Signed   By: Logan Bores M.D.   On: 07/03/2019 19:06    PHYSICAL EXAM  Temp:  [97.9 F (36.6 C)-98.5 F (36.9 C)] 97.9 F (36.6 C) (12/31 1122) Pulse Rate:  [25-84] 84 (12/31 1122) Resp:  [11-31] 15 (12/31 1122) BP: (105-148)/(66-108) 112/68 (12/31 1122) SpO2:  [97 %-100 %] 100 % (12/31 1122) Weight:  [73.9 kg] 73.9 kg (12/30  2128)  General - Well nourished, well developed, in no apparent distress.  Ophthalmologic - fundi not visualized due to noncooperation.  Cardiovascular - Regular rhythm and rate.  Mental Status -  Level of arousal and orientation to time, place, and person were intact. Language including expression, naming, repetition, comprehension was assessed and found intact.  Cranial Nerves II - XII - II - Visual field exam showed right lower quadrantanopia. III, IV, VI - Extraocular movements intact. V - Facial sensation intact bilaterally. VII - Facial movement intact bilaterally. VIII - Hearing & vestibular intact bilaterally. X - Palate elevates symmetrically. XI - Chin turning & shoulder shrug intact bilaterally. XII - Tongue protrusion intact.  Motor Strength - The patient's strength was normal in all extremities and pronator drift was absent.  Bulk was normal and fasciculations were absent.   Motor Tone - Muscle tone was assessed at the neck and appendages and was normal.  Reflexes - The patient's reflexes were symmetrical in all extremities and he had no pathological reflexes.  Sensory - Light touch, temperature/pinprick were assessed and were symmetrical.    Coordination - The patient had normal movements in the hands and feet with no ataxia or dysmetria.  Tremor was absent.  Gait and Station - deferred.   ASSESSMENT/PLAN Mr. Cougar Imel is a 28 y.o. 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 Also Cocaine withdrawal induced agitation. For SNF but pt left AMA. Now preresenting with reported memory deficit, numbness and tingling in his hands.  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 No acute abnormality.    CTA head unchanged anterior circulation. Mid BA stent into L P2 w/ decreased caliber beyond stent.  CTA neck Unremarkable, stent patent  MRI  Small early subacute L cerebellar infarct, new from 12/24 MRI  Last LDL 73  Last HgbA1c 5.5  P2Y12 23  Lovenox 40 mg sq daily for VTE prophylaxis  aspirin 81 mg daily and clopidogrel 75 mg daily prior to admission but not taking routinely, now on aspirin 81 mg daily and clopidogrel 75 mg daily. Pt agreeable to take as prescribed.  Therapy recommendations:  No PT needs  Disposition:  Anticipate return home  Basal artery aneurysm   Status post embolization, stenting and repair 12/23  Continue DAPT  Continue follow-up with Dr. Corliss Skains as outpatient  Hypertension  Stable . BP goal normotensive  Hyperlipidemia  Home meds:  No statin listed  Now on lipitor 40  Last LDL 73, goal < 70  Continue statin at discharge  Other Stroke Risk Factors  Cigarette smoker, advised to stop smoking  ETOH use, alcohol level <10, advised to drink no more than 2 drink(s) a day  Hx cocaine use. UDS this admission:  THC NONE DETECTED, Cocaine NONE DETECTED. None used per pt since last admission   Hx stroke/TIA  06/27/2019 post BA embolization and stent, cocaine positive  Other Active Problems  Anxiety. buspar 5 bid added.   GERD  Chronic anemia and thrombocytopenia  Hospital day # 0  Neurology will sign off. Please call with questions. Pt will follow up with Dr. Corliss Skains as scheduled. Thanks for the consult.  Marvel Plan, MD PhD Stroke Neurology 07/04/2019 1:22 PM    To contact Stroke Continuity provider, please refer to [email or web address]. After hours, contact General Neurology

## 2019-07-04 NOTE — Discharge Summary (Signed)
Physician Discharge Summary  [name] VOH:[id number] DOB: 11-25-1990 DOA: 07/03/2019  PCP: Patient, No Pcp Per  Admit date: 07/03/2019 Discharge date: 07/04/2019  Time spent: 45 minutes  Recommendations for Outpatient Follow-up:  1.  follow up with PCP 1-2 weeks to evaluate symptoms. 2. Follow up with neuro. Order placed for neuro to contact you with apt  Discharge Diagnoses:  Principal Problem:   Stroke [facility]) Active Problems:   Brain aneurysm   Essential hypertension   Normochromic normocytic anemia   Drug use   Cocaine abuse ([place])   Discharge Condition: stable  Diet recommendation: heart healthy  Filed Weights   07/03/19 2128  Weight: 73.9 kg    History of present illness:  Jacob Holder is a 28 y.o. 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 patient stated he had not been taking his medicines since that time and started having some memory issues and numbness of the face and headache and went back to the ER on 12/30.  Denied any weakness of extremities or any difficulty swallowing or speaking.  No new visual disturbances  Hospital Course:  Acute CVA. New L cerebellar infarct in setting of post BA embolization/stenting infarcts 12/24 (B parietal, L occipital, R cerebellar w/ SAH and small IVH) not taking meds after discharge. CT head known recent B parietal occipital and R cerebellar infarct. No acute abnormality. CTA head unchanged anterior circulation. Mid BA stent into L P2 w/ decreased caliber beyond stent. CTA neck Unremarkable, stent patent. MRI  Small early subacute L  cerebellar infarct, new from 12/24 MRI. Last LDL 73. Last HgbA1c 5.5. P2Y12 23.Lovenox 40 mg sq daily for VTE prophylaxis. aspirin 81 mg daily and clopidogrel 75 mg daily prior to admission but not taking routinely, now on aspirin 81 mg daily and clopidogrel 75 mg daily. Pt agreeable to take as prescribed.  evaluated by PT with no recs. Evaluated by neuro who recommend discharge and encouragement of compliance with med.  History of hypertension on verapamil. Controlled  History of cocaine abuse has not used any cocaine since last admission as per the patient.  Urine drug screen negative.  Chronic anemia and thrombocytopenia. Stable no s/sx bleeding. OP follow up.   Procedures:    Consultations:  Xu neuro  Discharge Exam: Vitals:   07/04/19 0906 07/04/19 1122  BP: 126/75 112/68  Pulse: 83 84  Resp: 18 15  Temp: 98.1 F (36.7 C) 97.9 F (36.6 C)  SpO2: 99% 100%    General: awake alert no acute distress  Cardiovascular: rrr no mgr no LE edema Respiratory: normal effort BS clear bilaterally no wheeze Neuro: alert ambulating in room with steady gait. Bilateral grip 5/5. Speech clear facial symmetry  Discharge Instructions   Discharge Instructions    Ambulatory referral to Neurology   Complete by: As directed    An appointment is requested in approximately: 6 weeks   Call MD for:  temperature >100.4   Complete by: As directed    Diet - low sodium heart healthy   Complete by: As directed    Discharge instructions   Complete by: As directed    Follow up with neuro  Take meds as instructed Stop smoking   Increase activity slowly   Complete by: As directed      Allergies as of 07/04/2019   No Known Allergies     Medication List    TAKE these medications   aspirin 81 MG chewable tablet Chew 81 mg by mouth daily.   atorvastatin 40 MG tablet Commonly known as: LIPITOR Take 1 tablet (40 mg total) by mouth daily at 6 PM.   busPIRone 5 MG tablet Commonly known  as: BUSPAR Take 1 tablet (5 mg total) by mouth 2 (two) times daily.   clopidogrel 75 MG tablet Commonly known as: PLAVIX Take 75 mg by mouth daily.   ibuprofen 200 MG tablet Commonly known as: ADVIL Take 600 mg by mouth every 6 (six) hours as needed for headache.   verapamil 80 MG tablet Commonly known as: CALAN Take 1 tablet (80 mg total) by mouth 3 (three) times daily.      No Known Allergies    The results of significant diagnostics from this hospitalization (including imaging, microbiology, ancillary and laboratory) are listed below for reference.    Significant Diagnostic Studies: EEG  Result Date: 06/26/2019 Charlsie Quest, MD     06/26/2019  4:45 PM Patient Name: Jacob Holder MRN: [id number] Epilepsy Attending: Charlsie Quest Referring Physician/Provider: Felicie Morn, PA Date: 06/26/2019 Duration: 22.25 mins Patient history: 28 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  EEG to evaluate for seizures. Level of alertness: Awake AEDs during EEG study: None Technical aspects: This EEG study was done with scalp electrodes positioned according to the 10-20 International system of electrode placement. Electrical activity was acquired at a sampling rate of  and reviewed with a high frequency filter of  and a low frequency filter of . EEG data were recorded continuously and digitally stored. Description: EEG was technically difficult due to significant myogenic artifact. No clear posterior dominant rhythm was seen. EEG showed continuous generalized 3-5 hz theta-delta slowing. Hyperventilation and photic stimulation were not performed. Abnormality - Continuous slow, generalized IMPRESSION: This technically difficult study is suggestive of moderate diffuse encephalopathy, non specific to etiology. No definite seizures or epileptiform discharges were seen throughout the recording. Charlsie Quest   CT Angio  Head W or Wo Contrast  Result Date: 07/03/2019 CLINICAL DATA:  Subarachnoid hemorrhage [facility]), follow-up. EXAM: CT ANGIOGRAPHY HEAD AND NECK TECHNIQUE: Multidetector CT imaging of the head and neck was performed using the standard protocol during bolus administration of intravenous contrast. Multiplanar CT image reconstructions and MIPs were obtained to evaluate the vascular anatomy. Carotid stenosis measurements (when applicable) are obtained utilizing NASCET criteria, using the distal internal carotid diameter as the denominator. CONTRAST:  75mL OMNIPAQUE IOHEXOL 350 MG/ML SOLN COMPARISON:  CT head performed 1 day earlier 07/02/2019, head CT 06/28/2019, head CT 06/27/2019, brain MRI 06/27/2019, CT angiogram head/neck 06/26/2019. FINDINGS: CT HEAD FINDINGS Brain: Known recent bilateral parietooccipital and right cerebellar infarcts were better appreciated on prior examinations. No CT evidence of new acute infarct. No evidence of acute intracranial hemorrhage. No evidence of internal mass. No midline shift or extra-axial fluid collection. Vascular: Reported separately Skull: Normal. Negative for fracture or focal lesion. Sinuses: Minimal ethmoid sinus mucosal thickening. No significant mastoid effusion. Orbits: Visualized orbits demonstrate no acute abnormality. Review of the MIP images confirms the above findings CTA NECK FINDINGS Aortic arch: Standard aortic branching. Right carotid system: CCA and ICA patent within the neck without stenosis.  Left carotid system: CCA and ICA patent within the neck without stenosis. Vertebral arteries: The vertebral arteries are codominant and patent throughout the neck without significant stenosis. Skeleton: No acute bony abnormality. Other neck: No soft tissue neck mass or cervical lymphadenopathy. Upper chest: No consolidation within the imaged lung apices. Review of the MIP images confirms the above findings CTA HEAD FINDINGS Anterior circulation: The intracranial internal  carotid arteries are patent without significant stenosis. The bilateral middle and anterior cerebral arteries are patent without proximal branch occlusion or proximal high-grade stenosis. Hypoplastic A1 left anterior cerebral artery. Redemonstrated 2 mm infundibulum projecting inferiorly from the supraclinoid right internal carotid artery. No anterior circulation aneurysm is identified. Posterior circulation: The intracranial vertebral arteries are patent without significant stenosis. The vertebrobasilar junction is normal. Redemonstrated stent visualized from the mid basilar artery extending into the left posterior cerebral artery P2 segment. Unchanged, there is decreased caliber of the P2 left posterior cerebral artery just distal to the stent. This appearance is likely exaggerated due to artifact. Distal to this, no occlusion or high-grade stenosis is demonstrated within the P2 left PCA. The right posterior cerebral artery is patent without significant proximal stenosis. Venous sinuses: Within limitations of contrast timing, no convincing thrombus. Anatomic variants: As described Review of the MIP images confirms the above findings IMPRESSION: CT head: 1. Known recent bilateral parietooccipital and right cerebellar infarcts better appreciated on prior exams. 2. No evidence of acute intracranial hemorrhage or new acute infarct. CTA neck: The common carotid, internal carotid and vertebral arteries are patent within the neck without significant stenosis. CTA head: 1. Unchanged appearance of the intracranial arterial circulation as compared to CTA head 05/27/2019. Redemonstrated stent extending from the mid basilar artery into the P2 left PCA. As before, there is decreased caliber of the left P2 segment just beyond the stent, likely exaggerated by artifact. 2. The remaining branch vessels of the circle-of-Willis are unremarkable without proximal occlusion or high-grade proximal stenosis. Electronically Signed   By:  Jackey Loge DO   On: 07/03/2019 14:21   CT ANGIO HEAD W OR WO CONTRAST  Result Date: 06/26/2019 CLINICAL DATA:  Loss of vision after neuro interventional procedure. Placement neuroform atlas stent across a wide necked basilar tip aneurysm. EXAM: CT ANGIOGRAPHY HEAD AND NECK TECHNIQUE: Multidetector CT imaging of the head and neck was performed using the standard protocol during bolus administration of intravenous contrast. Multiplanar CT image reconstructions and MIPs were obtained to evaluate the vascular anatomy. Carotid stenosis measurements (when applicable) are obtained utilizing NASCET criteria, using the distal internal carotid diameter as the denominator. CONTRAST:  75mL OMNIPAQUE IOHEXOL 350 MG/ML SOLN COMPARISON:  None. FINDINGS: CTA NECK FINDINGS Aortic arch: A 3 vessel arch configuration is present. No significant atherosclerotic changes are present. No aneurysm or stenosis. Right carotid system: The right common carotid artery is within normal limits. Bifurcation is unremarkable. Cervical right ICA is normal. Left carotid system: The left common carotid artery is within normal limits. Bifurcation is unremarkable. The cervical left ICA is normal. Vertebral arteries: The vertebral arteries are codominant. There is no significant stenosis in either vertebral artery in the neck. Vertebral arteries originate the subclavian arteries without significant stenosis. Skeleton: Vertebral body heights alignment are maintained. Focal lytic or blastic lesions are present. Other neck: Soft tissues the neck are otherwise unremarkable. Upper chest: Lung apices are clear.  Thoracic inlet is normal. Review of the MIP images confirms the above findings CTA HEAD FINDINGS Anterior circulation: The internal carotid arteries are  within normal limits through the ICA termini bilaterally. 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 Both A2 segments fill from right. M1 segments are normal bilaterally. MCA bifurcations are normal. ACA  and MCA branch vessels are within normal limits. No significant proximal stenosis or occlusion is present. Posterior circulation: The vertebral arteries are codominant. PICA origins are visualized and normal. Vertebrobasilar junction is normal. Stent is visualized from the distal basilar artery into the left P2 segment. There is decreased caliber of the vessel just distal to the stent. The appearance is likely exaggerated due to artifact. Distal branch vessels fill in the left PCA branches. Right PCA branches are within normal limits. Venous sinuses: The dural sinuses are patent. The straight sinus and deep cerebral veins are intact. Cortical veins are within normal limits. No vascular malformation is evident. Anatomic variants: None Review of the MIP images confirms the above findings IMPRESSION: 1. Arterial stent from the mid basilar artery into the left P2 segment. 2. Decreased caliber of the left P2 segment just beyond the stent is likely exaggerated by artifact. 3. No distal branch vessel occlusion to explain visual loss. 4. No occlusive disease involving the right PCA branch vessels. 5. No acute infarct is evident. 6. Normal CTA of the neck. These results were called by telephone at the time of interpretation on 06/26/2019 at 2:26 pm to provider [facility], who verbally acknowledged these results. Electronically Signed   By: Marin Roberts M.D.   On: 06/26/2019 14:40   CT HEAD WO CONTRAST  Result Date: 07/02/2019 CLINICAL DATA:  Possible stroke, focal neuro deficit, greater than 6 hours, stroke suspected. Additional history provided: Lapses and memory, difficulty performing tasks, headache EXAM: CT HEAD WITHOUT CONTRAST TECHNIQUE: Contiguous axial images were obtained from the base of the skull through the vertex without intravenous contrast. COMPARISON:  Head CT examinations 06/28/2019 and earlier, brain MRI 06/27/2019, CT angiogram head 06/26/2019 FINDINGS: Brain: Known recent bilateral  parietooccipital and right cerebellar infarcts were better appreciated on prior examinations. No CT evidence of new acute infarct. Previously demonstrated subarachnoid and intraventricular hemorrhage is no longer appreciated. No evidence of intracranial mass. No midline shift or extra-axial fluid collection. Cerebral volume is normal for age. Vascular: Redemonstrated stent extending from the mid basilar artery into the left posterior cerebral artery. No vascular calcifications. Skull: Normal. Negative for fracture or focal lesion. Sinuses/Orbits: Visualized orbits demonstrate no acute abnormality. No significant paranasal sinus disease or mastoid effusion at the imaged levels. IMPRESSION: Known recent bilateral parietooccipital and right cerebellar infarcts were better appreciated on prior examinations. No CT evidence of new acute infarct. Consider brain MRI for further evaluation, as clinically warranted. Previously demonstrated subarachnoid and intraventricular hemorrhage is no longer appreciated. Redemonstrated basilar artery/left posterior cerebral artery stent. Electronically Signed   By: Jackey Loge DO   On: 07/02/2019 18:15   CT HEAD WO CONTRAST  Result Date: 06/28/2019 CLINICAL DATA:  Nausea and vomiting. Subarachnoid hemorrhage. EXAM: CT HEAD WITHOUT CONTRAST TECHNIQUE: Contiguous axial images were obtained from the base of the skull through the vertex without intravenous contrast. COMPARISON:  MR head without contrast 06/27/2019. CTA head and neck 07/26/2018 FINDINGS: Brain: Fluid levels in the ventricles are less well seen than prior exam. 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 Infarct along medial aspect of the left occipital lobe noted. A right PICA territory infarct is noted. There is expected increasing hypoattenuation within each of these infarcts. No new infarcts are present. There is no expansion of suspected hemorrhage. Stent is  noted. Anterior circulation territories are  within limits. Vascular: Basilar artery to left PCA stent is again noted. No vascular calcifications are present. Skull: Calvarium is intact. No focal lytic or blastic lesions are present. No significant extracranial soft tissue lesion is present. Sinuses/Orbits: The paranasal sinuses and mastoid air cells are clear. The globes and orbits are within normal limits. IMPRESSION: 1. 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 2. Expected evolution of right PICA territory infarct. 3. No new infarcts. 4. Stable appearance of distal basilar artery to left PCA stent. 5. Decreasing intraventricular blood products. 6. No new hemorrhage. Electronically Signed   By: Marin Roberts M.D.   On: 06/28/2019 14:44   CT HEAD WO CONTRAST  Result Date: 06/27/2019 CLINICAL DATA:  28 year old male status post basilar tip aneurysm stenting with loss of vision. Follow-up MRI reveals evidence of basilar subarachnoid hemorrhage and IVH. EXAM: CT HEAD WITHOUT CONTRAST TECHNIQUE: Contiguous axial images were obtained from the base of the skull through the vertex without intravenous contrast. COMPARISON:  Brain MRI 0335 hours today and earlier. FINDINGS: Brain: Small volume intraventricular hemorrhage in the occipital horns and 4th ventricle, also visible by MRI. Basilar cistern subarachnoid hemorrhage primarily on the left: Interpeduncular, ambient cistern, prepontine, left CP angle, and pre medullary. The CT appearance is less convincing for subdural hematoma at the cisterna magna. Stable ventricle size, no ventriculomegaly. Patchy somewhat subtle hypodensity in the posterior parietal lobes, left occipital lobe and right cerebellum corresponding to the acute infarcts by MRI. No parenchymal hemorrhage. No significant intracranial mass effect at this time. Gray-white matter differentiation elsewhere remains within normal limits. Vascular: Basilar artery through left PCA vascular stent is evident. Skull: No  acute osseous abnormality identified. Sinuses/Orbits: Visualized paranasal sinuses and mastoids are stable and well pneumatized. Other: Intubated. Fluid in the pharynx. No acute orbit or scalp soft tissue finding. IMPRESSION: 1. Basilar cistern subarachnoid hemorrhage is primarily on the left and stable from the MRI earlier today. No superimposed subdural hematoma suspected. No intra-axial blood identified. 2. Trace associated IVH. No ventriculomegaly or significant intracranial mass effect at this time. 3. Patchy hypodensity associated with the acute parietal, left occipital, and right cerebellar infarcts. Electronically Signed   By: Odessa Fleming M.D.   On: 06/27/2019 07:21   CT Angio Neck W and/or Wo Contrast  Result Date: 07/03/2019 CLINICAL DATA:  Subarachnoid hemorrhage [facility]), follow-up. EXAM: CT ANGIOGRAPHY HEAD AND NECK TECHNIQUE: Multidetector CT imaging of the head and neck was performed using the standard protocol during bolus administration of intravenous contrast. Multiplanar CT image reconstructions and MIPs were obtained to evaluate the vascular anatomy. Carotid stenosis measurements (when applicable) are obtained utilizing NASCET criteria, using the distal internal carotid diameter as the denominator. CONTRAST:  75mL OMNIPAQUE IOHEXOL 350 MG/ML SOLN COMPARISON:  CT head performed 1 day earlier 07/02/2019, head CT 06/28/2019, head CT 06/27/2019, brain MRI 06/27/2019, CT angiogram head/neck 06/26/2019. FINDINGS: CT HEAD FINDINGS Brain: Known recent bilateral parietooccipital and right cerebellar infarcts were better appreciated on prior examinations. No CT evidence of new acute infarct. No evidence of acute intracranial hemorrhage. No evidence of internal mass. No midline shift or extra-axial fluid collection. Vascular: Reported separately Skull: Normal. Negative for fracture or focal lesion. Sinuses: Minimal ethmoid sinus mucosal thickening. No significant mastoid effusion. Orbits: Visualized orbits  demonstrate no acute abnormality. Review of the MIP images confirms the above findings CTA NECK FINDINGS Aortic arch: Standard aortic branching. Right carotid system: CCA and ICA patent within the neck without stenosis.  Left carotid system: CCA and ICA patent within the neck without stenosis. Vertebral arteries: The vertebral arteries are codominant and patent throughout the neck without significant stenosis. Skeleton: No acute bony abnormality. Other neck: No soft tissue neck mass or cervical lymphadenopathy. Upper chest: No consolidation within the imaged lung apices. Review of the MIP images confirms the above findings CTA HEAD FINDINGS Anterior circulation: The intracranial internal carotid arteries are patent without significant stenosis. The bilateral middle and anterior cerebral arteries are patent without proximal branch occlusion or proximal high-grade stenosis. Hypoplastic A1 left anterior cerebral artery. Redemonstrated 2 mm infundibulum projecting inferiorly from the supraclinoid right internal carotid artery. No anterior circulation aneurysm is identified. Posterior circulation: The intracranial vertebral arteries are patent without significant stenosis. The vertebrobasilar junction is normal. Redemonstrated stent visualized from the mid basilar artery extending into the left posterior cerebral artery P2 segment. Unchanged, there is decreased caliber of the P2 left posterior cerebral artery just distal to the stent. This appearance is likely exaggerated due to artifact. Distal to this, no occlusion or high-grade stenosis is demonstrated within the P2 left PCA. The right posterior cerebral artery is patent without significant proximal stenosis. Venous sinuses: Within limitations of contrast timing, no convincing thrombus. Anatomic variants: As described Review of the MIP images confirms the above findings IMPRESSION: CT head: 1. Known recent bilateral parietooccipital and right cerebellar infarcts better  appreciated on prior exams. 2. No evidence of acute intracranial hemorrhage or new acute infarct. CTA neck: The common carotid, internal carotid and vertebral arteries are patent within the neck without significant stenosis. CTA head: 1. Unchanged appearance of the intracranial arterial circulation as compared to CTA head 05/27/2019. Redemonstrated stent extending from the mid basilar artery into the P2 left PCA. As before, there is decreased caliber of the left P2 segment just beyond the stent, likely exaggerated by artifact. 2. The remaining branch vessels of the circle-of-Willis are unremarkable without proximal occlusion or high-grade proximal stenosis. Electronically Signed   By: Jackey Loge DO   On: 07/03/2019 14:21   CT ANGIO NECK W OR WO CONTRAST  Result Date: 06/26/2019 CLINICAL DATA:  Loss of vision after neuro interventional procedure. Placement neuroform atlas stent across a wide necked basilar tip aneurysm. EXAM: CT ANGIOGRAPHY HEAD AND NECK TECHNIQUE: Multidetector CT imaging of the head and neck was performed using the standard protocol during bolus administration of intravenous contrast. Multiplanar CT image reconstructions and MIPs were obtained to evaluate the vascular anatomy. Carotid stenosis measurements (when applicable) are obtained utilizing NASCET criteria, using the distal internal carotid diameter as the denominator. CONTRAST:  75mL OMNIPAQUE IOHEXOL 350 MG/ML SOLN COMPARISON:  None. FINDINGS: CTA NECK FINDINGS Aortic arch: A 3 vessel arch configuration is present. No significant atherosclerotic changes are present. No aneurysm or stenosis. Right carotid system: The right common carotid artery is within normal limits. Bifurcation is unremarkable. Cervical right ICA is normal. Left carotid system: The left common carotid artery is within normal limits. Bifurcation is unremarkable. The cervical left ICA is normal. Vertebral arteries: The vertebral arteries are codominant. There is no  significant stenosis in either vertebral artery in the neck. Vertebral arteries originate the subclavian arteries without significant stenosis. Skeleton: Vertebral body heights alignment are maintained. Focal lytic or blastic lesions are present. Other neck: Soft tissues the neck are otherwise unremarkable. Upper chest: Lung apices are clear.  Thoracic inlet is normal. Review of the MIP images confirms the above findings CTA HEAD FINDINGS Anterior circulation: The internal carotid arteries are  within normal limits through the ICA termini bilaterally. 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 Both A2 segments fill from right. M1 segments are normal bilaterally. MCA bifurcations are normal. ACA and MCA branch vessels are within normal limits. No significant proximal stenosis or occlusion is present. Posterior circulation: The vertebral arteries are codominant. PICA origins are visualized and normal. Vertebrobasilar junction is normal. Stent is visualized from the distal basilar artery into the left P2 segment. There is decreased caliber of the vessel just distal to the stent. The appearance is likely exaggerated due to artifact. Distal branch vessels fill in the left PCA branches. Right PCA branches are within normal limits. Venous sinuses: The dural sinuses are patent. The straight sinus and deep cerebral veins are intact. Cortical veins are within normal limits. No vascular malformation is evident. Anatomic variants: None Review of the MIP images confirms the above findings IMPRESSION: 1. Arterial stent from the mid basilar artery into the left P2 segment. 2. Decreased caliber of the left P2 segment just beyond the stent is likely exaggerated by artifact. 3. No distal branch vessel occlusion to explain visual loss. 4. No occlusive disease involving the right PCA branch vessels. 5. No acute infarct is evident. 6. Normal CTA of the neck. These results were called by telephone at the time of interpretation on 06/26/2019 at 2:26  pm to provider [facility], who verbally acknowledged these results. Electronically Signed   By: Marin Roberts M.D.   On: 06/26/2019 14:40   MR BRAIN WO CONTRAST  Result Date: 07/03/2019 CLINICAL DATA:  Neuro deficits, subacute. Stroke follow-up. Difficulty expressing thoughts and difficulty with recall. 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 EXAM: MRI HEAD WITHOUT CONTRAST TECHNIQUE: Multiplanar, multiecho pulse sequences of the brain and surrounding structures were obtained without intravenous contrast. COMPARISON:  Head CT/CTA 07/03/2019 and MRI 06/27/2019 FINDINGS: Brain: There is decreased diffusion weighted signal abnormality associated with the right cerebellar, bilateral occipital, and bilateral parietal infarcts on the prior MRI which are now subacute in appearance. A small infarct in the left cerebellum is new from the prior MRI although it also has an early subacute appearance. No definite residual intraventricular subarachnoid hemorrhage is identified. There is no extra-axial fluid collection or intracranial mass effect. The ventricles and sulci are normal. Vascular: Major intracranial vascular flow voids are preserved. Susceptibility artifact related to the stent extending from the basilar artery into the left PCA. Skull and upper cervical spine: Unremarkable bone marrow signal. Sinuses/Orbits: Unremarkable orbits. Paranasal sinuses and mastoid air cells are clear. Other: None. IMPRESSION: 1. Small early subacute left cerebellar infarct, new from the 06/27/2019 MRI. 2. Expected evolution of subacute bilateral parietal, occipital, and right cerebellar infarcts. 3. Resolved intraventricular and subarachnoid hemorrhage. Electronically Signed   By: Sebastian Ache M.D.   On: 07/03/2019 19:06   MR BRAIN WO CONTRAST  Addendum Date: 06/27/2019   ADDENDUM REPORT: 06/27/2019 06:27 ADDENDUM: Study discussed by telephone with Dr. Caryl Pina on  06/27/2019 at 0616 hours. Electronically Signed   By: Odessa Fleming M.D.   On: 06/27/2019 06:27   Result Date: 06/27/2019 CLINICAL DATA:  28 year old male with loss of vision after NIR procedure, stent placed across basilar tip aneurysm. EXAM: MRI HEAD WITHOUT CONTRAST TECHNIQUE: Multiplanar, multiecho pulse sequences of the brain and surrounding structures were obtained without intravenous contrast. COMPARISON:  CTA head and neck yesterday.  Brain MRI 02/22/2019. FINDINGS: Brain: There is new high FLAIR signal and intermediate T1 signal in the basilar cisterns and posterior  fossa suspicious for subarachnoid hemorrhage, including in the cisterna magna. Component of subdural blood is felt less likely. And there is associated intraventricular hemorrhage layering in the occipital horns, also the 4th ventricle. No superimposed ventriculomegaly. No midline shift or significant intracranial mass effect. Patchy areas of cortical and white matter restricted diffusion in both posterior parietal lobes, superior occipital lobes, the left occipital pole, left lateral occipital lobe, and a small area of the anterior and medial occipital lobe. Superimposed similar patchy restricted diffusion in the right cerebellum including the right tonsil. Cytotoxic edema with no parenchymal blood identified. No definite superimposed brainstem restricted diffusion. No thalamic restricted diffusion. No anterior circulation restricted diffusion identified. Stable gray and white matter signal in the anterior circulation. Vascular: Major intracranial vascular flow voids appear stable compared to August. Basilar through left PCA stent better demonstrated by CTA. Skull and upper cervical spine: No definite hemorrhage in the visible upper cervical spine. Bone marrow signal remains normal. Sinuses/Orbits: Intubated. Fluid in the pharynx. Paranasal sinuses remain well pneumatized. Negative orbits. Other: Mastoids remain clear. IMPRESSION: 1. Basilar  cistern subarachnoid hemorrhage and small volume intraventricular hemorrhage appears new from the CTA yesterday. Difficult to exclude a component of subdural hematoma at the foramen magnum. 2. No ventriculomegaly or significant intracranial mass effect at this time. 3. 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 No brainstem or anterior circulation involvement identified. No intra-axial hemorrhage identified. 4. Major intracranial vascular flow voids appear stable. Dr. Otelia Limes with Neurology was paged via AMION regarding the findings this exam on 06/27/2019 at 0553 hours. Electronically Signed: By: Odessa Fleming M.D. On: 06/27/2019 06:01   IR Transcath/Emboliz  Result Date: 07/01/2019 CLINICAL DATA:  Severe sudden headache a few weeks ago followed by persistent headaches variable in intensity. 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 EXAM: TRANSCATHETER THERAPY EMBOLIZATION COMPARISON:  Diagnostic catheter arteriogram of March 05, 2019. MEDICATIONS: Heparin 5,000 units IV; Ancef 2 g IV antibiotic was administered within 1 hour of the procedure. ANESTHESIA/SEDATION: General anesthesia. CONTRAST:  Isovue 300 approximately 70 cc. FLUOROSCOPY TIME:  Fluoroscopy Time: 48 minutes 25 seconds (1749 mGy). COMPLICATIONS: None immediate. TECHNIQUE: Informed written consent was obtained from the patient after a thorough discussion of the procedural risks, benefits and alternatives. All questions were addressed. Maximal Sterile Barrier Technique was utilized including caps, mask, sterile gowns, sterile gloves, sterile drape, hand hygiene and skin antiseptic. A timeout was performed prior to the initiation of the procedure. The right groin was prepped and draped in the usual sterile fashion. Thereafter using modified Seldinger technique, transfemoral access into the right common femoral artery was obtained without difficulty. Over a 0.035 inch guidewire, a 5  French Pinnacle sheath was inserted. Through this, and also over 0.035 inch guidewire, a 5 [place] JB 1 catheter was advanced to the aortic arch region and selectively positioned in the vertebral artery. FINDINGS: The right subclavian arteriogram demonstrates the origin of the right vertebral artery to be widely patent. The vessel is seen to ascend normally to the cranial skull base. Wide patency is seen of the right vertebrobasilar junction and the right posterior-inferior cerebellar artery. The basilar artery, the posterior cerebral arteries, the superior cerebellar arteries and the anterior-inferior cerebellar arteries opacify into the capillary and venous phases. 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 Both posterior cerebral arteries are seen to emanate from the inferior aspect of the fundus of the aneurysm as well as the superior  cerebellar arteries. Retrograde opacification of the left vertebrobasilar junction to the left posterior-inferior cerebellar artery is seen from the right vertebral artery arteriogram. ENDOVASCULAR STAGED EMBOLIZATION OF WIDE NECKED LARGE BASILAR APEX ANEURYSM. The diagnostic JB 1 catheter in the distal right subclavian artery was then exchanged for a 0.035 inch 300 cm Rosen exchange guidewire for an 80 cm 8 [place] Neuron Max sheath. The guidewire was removed. Good aspiration obtained from the hub of the Neuron Max sheath. The sheath was then retrieved proximally to engage the origin of the right vertebral artery. Over a 0.035 inch Roadrunner guidewire, a 5 French 115 cm Catalyst guide catheter was then advanced to the distal right vertebral artery. The guidewire was removed. Good aspiration obtained from the hub of the Catalyst guide catheter. A gentle control arteriogram performed through the Catalyst guide catheter demonstrated no change in the posterior fossa circulation. The 8 French Neuron Max sheath was then advanced to the mid third of  the right vertebral artery. Using biplane roadmap technique and constant fluoroscopic guidance, in a coaxial manner and with constant heparinized saline infusion, a Phenom 17 microcatheter was then advanced over a 0.014 inch standard Synchro micro guidewire to the distal end of the 5 [place] Catalyst guide catheter. With the micro guidewire leading with a J-tip configuration, the combination was navigated to the distal basilar artery. Using a torque device, the left posterior cerebral artery was then accessed with the micro guidewire and advanced to the P2 P3 region followed by the microcatheter. The guidewire was removed. Good aspiration obtained from the hub of the microcatheter. A gentle control arteriogram performed through the microcatheter demonstrated safe position of tip of the microcatheter which was then connected to continuous heparinized saline infusion. Measurements were then performed of the left posterior cerebral artery distal P1 segment and the distal basilar artery. It was elected to place a 4.5 mm x 30 mm Neuroform Atlas stent across the left PCA and the distal basilar artery. The stent was then advanced to the distal end of the microcatheter. The proximal and the distal landing zones were then defined. With slight forward gentle traction with the right hand on the delivery micro guidewire, with the left hand the delivery microcatheter were retrieved unsheathing first the distal and then the proximal stent. The delivery microcatheter and the micro guidewire were then retrieved and removed without entanglement with the Neuroform stent. A control arteriogram performed through the 5 [place] Catalyst guide catheter in the right vertebral artery demonstrated excellent apposition proximally and distally of the stent across the neck of the aneurysm. Control arteriograms were performed at 15, 30 and 35 minutes post stent deployment. These continued to demonstrate excellent apposition and flow without  evidence of intraluminal filling defects, or of change in caliber of the posterior cerebral arteries, the superior cerebral arteries or the vertebral artery. Also no evidence of extravasation or mass-effect or midline shift was seen. She notes evidence of sudden surges in blood pressure were noted. The 5 [place] Catalyst guide catheter was retrieved and removed. A final control arteriogram performed through the 8 French Neuroform sheath in the origin of the right vertebral artery demonstrated excellent flow through the intracranial and extracranial vertebral artery with no change in the posterior cerebral arteries or the superior cerebellar arteries. The stent was noted to be stable distally and proximally. During the procedure, the patient's ACT was maintained in the region of approximately 200 seconds. The 8 French Pinnacle sheath was then removed with the successful placement of  an 8 [place] Angio-Seal closure device. The right groin appeared soft without evidence of bleeding or hematoma. Distal pulses remained Dopplerable in the posterior tibial, and the dorsalis pedis arteries bilaterally unchanged. The patient's general anesthesia was then reversed, and the patient was extubated without event. The patient was given 5 mg of protamine sulfate to partially reverse the effects of the heparin. Upon recovery, the patient denied any headaches, nausea, vomiting or visual difficulties. She responded to commands appropriately. She moved all extremities equally. She was then transported to the recovery area intact. The patient was started on IV heparin with blood pressure in the region of 120-140 mmHg. IMPRESSION: Status post endovascular staged embolization of large wide neck basilar apex aneurysm using the Neuroform Atlas stent as described. PLAN: Follow-up in the clinic in 2 weeks. Electronically Signed   By: Julieanne Cotton M.D.   On: 06/27/2019 17:04   IR Angiogram Follow Up Study  Result Date:  06/26/2019 CLINICAL DATA:  Severe sudden headache a few weeks ago followed by persistent headaches variable in intensity. 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  EXAM: TRANSCATHETER THERAPY EMBOLIZATION  COMPARISON:  Diagnostic catheter arteriogram of March 05, 2019.  MEDICATIONS: Heparin 5,000 units IV; Ancef 2 g IV antibiotic was administered within 1 hour of the procedure.  ANESTHESIA/SEDATION: General anesthesia.  CONTRAST:  Isovue 300 approximately 70 cc.  FLUOROSCOPY TIME:  Fluoroscopy Time: 48 minutes 25 seconds (1749 mGy).  COMPLICATIONS: None immediate.  TECHNIQUE: Informed written consent was obtained from the patient after a thorough discussion of the procedural risks, benefits and alternatives. All questions were addressed. Maximal Sterile Barrier Technique was utilized including caps, mask, sterile gowns, sterile gloves, sterile drape, hand hygiene and skin antiseptic. A timeout was performed prior to the initiation of the procedure.  The right groin was prepped and draped in the usual sterile fashion. Thereafter using modified Seldinger technique, transfemoral access into the right common femoral artery was obtained without difficulty. Over a 0.035 inch guidewire, a 5 French Pinnacle sheath was inserted. Through this, and also over 0.035 inch guidewire, a 5 [place] JB 1 catheter was advanced to the aortic arch region and selectively positioned in the vertebral artery.  FINDINGS: The right subclavian arteriogram demonstrates the origin of the right vertebral artery to be widely patent.  The vessel is seen to ascend normally to the cranial skull base. Wide patency is seen of the right vertebrobasilar junction and the right posterior-inferior cerebellar artery.  The basilar artery, the posterior cerebral arteries, the superior cerebellar arteries and the anterior-inferior cerebellar arteries opacify into the capillary and venous phases.  Again seen is the large wide  necked basilar apex aneurysm protruding posteriorly. Both posterior cerebral arteries are seen to emanate from the inferior aspect of the fundus of the aneurysm as well as the superior cerebellar arteries. Retrograde opacification of the left vertebrobasilar junction to the left posterior-inferior cerebellar artery is seen from the right vertebral artery arteriogram.  ENDOVASCULAR STAGED EMBOLIZATION OF WIDE NECKED LARGE BASILAR APEX ANEURYSM.  The diagnostic JB 1 catheter in the distal right subclavian artery was then exchanged for a 0.035 inch 300 cm Rosen exchange guidewire for an 80 cm 8 [place] Neuron Max sheath. The guidewire was removed. Good aspiration obtained from the hub of the Neuron Max sheath. The sheath was then retrieved proximally to engage the origin of the right vertebral artery.  Over a 0.035 inch Roadrunner guidewire, a 5 French 115 cm Catalyst guide catheter was then advanced  to the distal right vertebral artery.  The guidewire was removed. Good aspiration obtained from the hub of the Catalyst guide catheter. A gentle control arteriogram performed through the Catalyst guide catheter demonstrated no change in the posterior fossa circulation.  The 8 French Neuron Max sheath was then advanced to the mid third of the right vertebral artery.  Using biplane roadmap technique and constant fluoroscopic guidance, in a coaxial manner and with constant heparinized saline infusion, a Phenom 17 microcatheter was then advanced over a 0.014 inch standard Synchro micro guidewire to the distal end of the 5 [place] Catalyst guide catheter.  With the micro guidewire leading with a J-tip configuration, the combination was navigated to the distal basilar artery.  Using a torque device, the left posterior cerebral artery was then accessed with the micro guidewire and advanced to the P2 P3 region followed by the microcatheter. The guidewire was removed. Good aspiration obtained from the hub of the  microcatheter. A gentle control arteriogram performed through the microcatheter demonstrated safe position of tip of the microcatheter which was then connected to continuous heparinized saline infusion. Measurements were then performed of the left posterior cerebral artery distal P1 segment and the distal basilar artery.  It was elected to place a 4.5 mm x 30 mm Neuroform Atlas stent across the left PCA and the distal basilar artery.  The stent was then advanced to the distal end of the microcatheter.  The proximal and the distal landing zones were then defined.  With slight forward gentle traction with the right hand on the delivery micro guidewire, with the left hand the delivery microcatheter were retrieved unsheathing first the distal and then the proximal stent.  The delivery microcatheter and the micro guidewire were then retrieved and removed without entanglement with the Neuroform stent.  A control arteriogram performed through the 5 [place] Catalyst guide catheter in the right vertebral artery demonstrated excellent apposition proximally and distally of the stent across the neck of the aneurysm.  Control arteriograms were performed at 15, 30 and 35 minutes post stent deployment. These continued to demonstrate excellent apposition and flow without evidence of intraluminal filling defects, or of change in caliber of the posterior cerebral arteries, the superior cerebral arteries or the vertebral artery.  Also no evidence of extravasation or mass-effect or midline shift was seen.  She notes evidence of sudden surges in blood pressure were noted.  The 5 [place] Catalyst guide catheter was retrieved and removed. A final control arteriogram performed through the 8 French Neuroform sheath in the origin of the right vertebral artery demonstrated excellent flow through the intracranial and extracranial vertebral artery with no change in the posterior cerebral arteries or the superior cerebellar arteries. The  stent was noted to be stable distally and proximally.  During the procedure, the patient's ACT was maintained in the region of approximately 200 seconds.  The 8 French Pinnacle sheath was then removed with the successful placement of an 8 French Angio-Seal closure device.  The right groin appeared soft without evidence of bleeding or hematoma. Distal pulses remained Dopplerable in the posterior tibial, and the dorsalis pedis arteries bilaterally unchanged.  The patient's general anesthesia was then reversed, and the patient was extubated without event.  The patient was given 5 mg of protamine sulfate to partially reverse the effects of the heparin. Upon recovery, the patient denied any headaches, nausea, vomiting or visual difficulties. She responded to commands appropriately. She moved all extremities equally.  She was then transported to the  recovery area intact. The patient was started on IV heparin with blood pressure in the region of 120-140 mmHg.  IMPRESSION: Status post endovascular staged embolization of large wide neck basilar apex aneurysm using the Neuroform Atlas stent as described.  PLAN: Follow-up in the clinic in 2 weeks.   Electronically Signed   By: Julieanne Cotton M.D.   On: 06/27/2019 17:04   DG Chest Port 1 View  Result Date: 06/27/2019 CLINICAL DATA:  Endotracheal tube.  Recent aneurysm embolization. EXAM: PORTABLE CHEST 1 VIEW COMPARISON:  None. FINDINGS: Endotracheal tube has tip 7.8 cm above the carina. Lungs are adequately inflated without focal airspace consolidation or effusion. Cardiomediastinal silhouette is within normal. Remaining bones and soft tissues are normal. IMPRESSION: No active disease. Endotracheal tube with tip 7.8 cm above the carina. Electronically Signed   By: Elberta Fortis M.D.   On: 06/27/2019 07:10   ECHOCARDIOGRAM COMPLETE  Result Date: 06/28/2019   ECHOCARDIOGRAM REPORT   Patient Name:   [facility] Date of Exam: 06/28/2019 Medical Rec #:   [id number]       Height:       71.0 in Accession #:    [id number]      Weight:       163.0 lb Date of Birth:  1991/05/31      BSA:          1.93 m Patient Age:    28 years        BP:           108/81 mmHg Patient Gender: M               HR:           75 bpm. Exam Location:  Inpatient Procedure: 2D Echo Indications:    Stroke  History:        Patient has no prior history of Echocardiogram examinations.  Sonographer:    Thurman Coyer RDCS (AE) Referring Phys: [id number] SHARON L BIBY IMPRESSIONS  1. Left ventricular ejection fraction, by visual estimation, is 60 to 65%. The left ventricle has normal function. There is no left ventricular hypertrophy.  2. The left ventricle has no regional wall motion abnormalities.  3. Global right ventricle has normal systolic function.The right ventricular size is normal. No increase in right ventricular wall thickness.  4. Left atrial size was normal.  5. Right atrial size was normal.  6. The mitral valve is normal in structure. Mild mitral valve regurgitation.  7. The tricuspid valve is normal in structure.  8. The aortic valve is tricuspid. Aortic valve regurgitation is not visualized. No evidence of aortic valve sclerosis or stenosis.  9. The pulmonic valve was normal in structure. Pulmonic valve regurgitation is not visualized. 10. The inferior vena cava is normal in size with greater than 50% respiratory variability, suggesting right atrial pressure of 3 mmHg. FINDINGS  Left Ventricle: Left ventricular ejection fraction, by visual estimation, is 60 to 65%. The left ventricle has normal function. The left ventricle has no regional wall motion abnormalities. The left ventricular internal cavity size was the left ventricle is normal in size. There is no left ventricular hypertrophy. Left ventricular diastolic parameters were normal. Right Ventricle: The right ventricular size is normal. No increase in right ventricular wall thickness. Global RV systolic function is has normal  systolic function. Left Atrium: Left atrial size was normal in size. Right Atrium: Right atrial size was normal in size Pericardium: There is no evidence of pericardial effusion. Mitral  Valve: The mitral valve is normal in structure. Mild mitral valve regurgitation. Tricuspid Valve: The tricuspid valve is normal in structure. Tricuspid valve regurgitation is trivial. Aortic Valve: The aortic valve is tricuspid. Aortic valve regurgitation is not visualized. The aortic valve is structurally normal, with no evidence of sclerosis or stenosis. Pulmonic Valve: The pulmonic valve was normal in structure. Pulmonic valve regurgitation is not visualized. Pulmonic regurgitation is not visualized. Aorta: The aortic root is normal in size and structure. Venous: The inferior vena cava is normal in size with greater than 50% respiratory variability, suggesting right atrial pressure of 3 mmHg. IAS/Shunts: No atrial level shunt detected by color flow Doppler.  LEFT VENTRICLE PLAX 2D LVIDd:         5.40 cm  Diastology LVIDs:         3.50 cm  LV e' lateral:   11.60 cm/s LV PW:         0.80 cm  LV E/e' lateral: 7.9 LV IVS:        0.70 cm  LV e' medial:    11.70 cm/s LVOT diam:     2.10 cm  LV E/e' medial:  7.9 LV SV:         90 ml LV SV Index:   47.00 LVOT Area:     3.46 cm  RIGHT VENTRICLE RV S prime:     17.30 cm/s TAPSE (M-mode): 2.7 cm LEFT ATRIUM             Index       RIGHT ATRIUM           Index LA diam:        3.70 cm 1.91 cm/m  RA Area:     15.20 cm LA Vol (A2C):   53.5 ml 27.68 ml/m RA Volume:   38.50 ml  19.92 ml/m LA Vol (A4C):   44.8 ml 23.18 ml/m LA Biplane Vol: 49.0 ml 25.35 ml/m  AORTIC VALVE LVOT Vmax:   110.00 cm/s LVOT Vmean:  66.200 cm/s LVOT VTI:    0.192 m  AORTA Ao Root diam: 3.10 cm MITRAL VALVE MV Area (PHT): 3.23 cm             SHUNTS MV PHT:        68.15 msec           Systemic VTI:  0.19 m MV Decel Time: 235 msec             Systemic Diam: 2.10 cm MV E velocity: 92.00 cm/s 103 cm/s MV A velocity:  64.90 cm/s 70.3 cm/s MV E/A ratio:  1.42       1.5  Prentice Docker MD Electronically signed by Prentice Docker MD Signature Date/Time: 06/28/2019/4:11:29 PM    Final    IR ANGIO VERTEBRAL SEL VERTEBRAL UNI R MOD SED  Result Date: 06/26/2019 CLINICAL DATA:  Severe sudden headache a few weeks ago followed by persistent headaches variable in intensity. 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  EXAM: TRANSCATHETER THERAPY EMBOLIZATION  COMPARISON:  Diagnostic catheter arteriogram of March 05, 2019.  MEDICATIONS: Heparin 5,000 units IV; Ancef 2 g IV antibiotic was administered within 1 hour of the procedure.  ANESTHESIA/SEDATION: General anesthesia.  CONTRAST:  Isovue 300 approximately 70 cc.  FLUOROSCOPY TIME:  Fluoroscopy Time: 48 minutes 25 seconds (1749 mGy).  COMPLICATIONS: None immediate.  TECHNIQUE: Informed written consent was obtained from the patient after a thorough discussion of the procedural risks, benefits and alternatives. All  questions were addressed. Maximal Sterile Barrier Technique was utilized including caps, mask, sterile gowns, sterile gloves, sterile drape, hand hygiene and skin antiseptic. A timeout was performed prior to the initiation of the procedure.  The right groin was prepped and draped in the usual sterile fashion. Thereafter using modified Seldinger technique, transfemoral access into the right common femoral artery was obtained without difficulty. Over a 0.035 inch guidewire, a 5 French Pinnacle sheath was inserted. Through this, and also over 0.035 inch guidewire, a 5 [place] JB 1 catheter was advanced to the aortic arch region and selectively positioned in the vertebral artery.  FINDINGS: The right subclavian arteriogram demonstrates the origin of the right vertebral artery to be widely patent.  The vessel is seen to ascend normally to the cranial skull base. Wide patency is seen of the right vertebrobasilar junction and the right  posterior-inferior cerebellar artery.  The basilar artery, the posterior cerebral arteries, the superior cerebellar arteries and the anterior-inferior cerebellar arteries opacify into the capillary and venous phases.  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 Both posterior cerebral arteries are seen to emanate from the inferior aspect of the fundus of the aneurysm as well as the superior cerebellar arteries. Retrograde opacification of the left vertebrobasilar junction to the left posterior-inferior cerebellar artery is seen from the right vertebral artery arteriogram.  ENDOVASCULAR STAGED EMBOLIZATION OF WIDE NECKED LARGE BASILAR APEX ANEURYSM.  The diagnostic JB 1 catheter in the distal right subclavian artery was then exchanged for a 0.035 inch 300 cm Rosen exchange guidewire for an 80 cm 8 [place] Neuron Max sheath. The guidewire was removed. Good aspiration obtained from the hub of the Neuron Max sheath. The sheath was then retrieved proximally to engage the origin of the right vertebral artery.  Over a 0.035 inch Roadrunner guidewire, a 5 French 115 cm Catalyst guide catheter was then advanced to the distal right vertebral artery.  The guidewire was removed. Good aspiration obtained from the hub of the Catalyst guide catheter. A gentle control arteriogram performed through the Catalyst guide catheter demonstrated no change in the posterior fossa circulation.  The 8 French Neuron Max sheath was then advanced to the mid third of the right vertebral artery.  Using biplane roadmap technique and constant fluoroscopic guidance, in a coaxial manner and with constant heparinized saline infusion, a Phenom 17 microcatheter was then advanced over a 0.014 inch standard Synchro micro guidewire to the distal end of the 5 [place] Catalyst guide catheter.  With the micro guidewire leading with a J-tip configuration, the combination was navigated to the distal basilar artery.   Using a torque device, the left posterior cerebral artery was then accessed with the micro guidewire and advanced to the P2 P3 region followed by the microcatheter. The guidewire was removed. Good aspiration obtained from the hub of the microcatheter. A gentle control arteriogram performed through the microcatheter demonstrated safe position of tip of the microcatheter which was then connected to continuous heparinized saline infusion. Measurements were then performed of the left posterior cerebral artery distal P1 segment and the distal basilar artery.  It was elected to place a 4.5 mm x 30 mm Neuroform Atlas stent across the left PCA and the distal basilar artery.  The stent was then advanced to the distal end of the microcatheter.  The proximal and the distal landing zones were then defined.  With slight forward gentle traction with the right hand on the delivery micro guidewire, with the left  hand the delivery microcatheter were retrieved unsheathing first the distal and then the proximal stent.  The delivery microcatheter and the micro guidewire were then retrieved and removed without entanglement with the Neuroform stent.  A control arteriogram performed through the 5 [place] Catalyst guide catheter in the right vertebral artery demonstrated excellent apposition proximally and distally of the stent across the neck of the aneurysm.  Control arteriograms were performed at 15, 30 and 35 minutes post stent deployment. These continued to demonstrate excellent apposition and flow without evidence of intraluminal filling defects, or of change in caliber of the posterior cerebral arteries, the superior cerebral arteries or the vertebral artery.  Also no evidence of extravasation or mass-effect or midline shift was seen.  She notes evidence of sudden surges in blood pressure were noted.  The 5 [place] Catalyst guide catheter was retrieved and removed. A final control arteriogram performed through the 8 French  Neuroform sheath in the origin of the right vertebral artery demonstrated excellent flow through the intracranial and extracranial vertebral artery with no change in the posterior cerebral arteries or the superior cerebellar arteries. The stent was noted to be stable distally and proximally.  During the procedure, the patient's ACT was maintained in the region of approximately 200 seconds.  The 8 French Pinnacle sheath was then removed with the successful placement of an 8 French Angio-Seal closure device.  The right groin appeared soft without evidence of bleeding or hematoma. Distal pulses remained Dopplerable in the posterior tibial, and the dorsalis pedis arteries bilaterally unchanged.  The patient's general anesthesia was then reversed, and the patient was extubated without event.  The patient was given 5 mg of protamine sulfate to partially reverse the effects of the heparin. Upon recovery, the patient denied any headaches, nausea, vomiting or visual difficulties. She responded to commands appropriately. She moved all extremities equally.  She was then transported to the recovery area intact. The patient was started on IV heparin with blood pressure in the region of 120-140 mmHg.  IMPRESSION: Status post endovascular staged embolization of large wide neck basilar apex aneurysm using the Neuroform Atlas stent as described.  PLAN: Follow-up in the clinic in 2 weeks.   Electronically Signed   By: Julieanne Cotton M.D.   On: 06/27/2019 17:04   IR NEURO EACH ADD'L AFTER BASIC UNI RIGHT (MS)  Result Date: 06/26/2019 CLINICAL DATA:  Severe sudden headache a few weeks ago followed by persistent headaches variable in intensity. 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  EXAM: TRANSCATHETER THERAPY EMBOLIZATION  COMPARISON:  Diagnostic catheter arteriogram of March 05, 2019.  MEDICATIONS: Heparin 5,000 units IV; Ancef 2 g IV antibiotic was administered within 1 hour of the  procedure.  ANESTHESIA/SEDATION: General anesthesia.  CONTRAST:  Isovue 300 approximately 70 cc.  FLUOROSCOPY TIME:  Fluoroscopy Time: 48 minutes 25 seconds (1749 mGy).  COMPLICATIONS: None immediate.  TECHNIQUE: Informed written consent was obtained from the patient after a thorough discussion of the procedural risks, benefits and alternatives. All questions were addressed. Maximal Sterile Barrier Technique was utilized including caps, mask, sterile gowns, sterile gloves, sterile drape, hand hygiene and skin antiseptic. A timeout was performed prior to the initiation of the procedure.  The right groin was prepped and draped in the usual sterile fashion. Thereafter using modified Seldinger technique, transfemoral access into the right common femoral artery was obtained without difficulty. Over a 0.035 inch guidewire, a 5 French Pinnacle sheath was inserted. Through this, and also over 0.035 inch  guidewire, a 5 [place] JB 1 catheter was advanced to the aortic arch region and selectively positioned in the vertebral artery.  FINDINGS: The right subclavian arteriogram demonstrates the origin of the right vertebral artery to be widely patent.  The vessel is seen to ascend normally to the cranial skull base. Wide patency is seen of the right vertebrobasilar junction and the right posterior-inferior cerebellar artery.  The basilar artery, the posterior cerebral arteries, the superior cerebellar arteries and the anterior-inferior cerebellar arteries opacify into the capillary and venous phases.  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 Both posterior cerebral arteries are seen to emanate from the inferior aspect of the fundus of the aneurysm as well as the superior cerebellar arteries. Retrograde opacification of the left vertebrobasilar junction to the left posterior-inferior cerebellar artery is seen from the right vertebral artery arteriogram.  ENDOVASCULAR STAGED  EMBOLIZATION OF WIDE NECKED LARGE BASILAR APEX ANEURYSM.  The diagnostic JB 1 catheter in the distal right subclavian artery was then exchanged for a 0.035 inch 300 cm Rosen exchange guidewire for an 80 cm 8 [place] Neuron Max sheath. The guidewire was removed. Good aspiration obtained from the hub of the Neuron Max sheath. The sheath was then retrieved proximally to engage the origin of the right vertebral artery.  Over a 0.035 inch Roadrunner guidewire, a 5 French 115 cm Catalyst guide catheter was then advanced to the distal right vertebral artery.  The guidewire was removed. Good aspiration obtained from the hub of the Catalyst guide catheter. A gentle control arteriogram performed through the Catalyst guide catheter demonstrated no change in the posterior fossa circulation.  The 8 French Neuron Max sheath was then advanced to the mid third of the right vertebral artery.  Using biplane roadmap technique and constant fluoroscopic guidance, in a coaxial manner and with constant heparinized saline infusion, a Phenom 17 microcatheter was then advanced over a 0.014 inch standard Synchro micro guidewire to the distal end of the 5 [place] Catalyst guide catheter.  With the micro guidewire leading with a J-tip configuration, the combination was navigated to the distal basilar artery.  Using a torque device, the left posterior cerebral artery was then accessed with the micro guidewire and advanced to the P2 P3 region followed by the microcatheter. The guidewire was removed. Good aspiration obtained from the hub of the microcatheter. A gentle control arteriogram performed through the microcatheter demonstrated safe position of tip of the microcatheter which was then connected to continuous heparinized saline infusion. Measurements were then performed of the left posterior cerebral artery distal P1 segment and the distal basilar artery.  It was elected to place a 4.5 mm x 30 mm Neuroform Atlas stent across the left  PCA and the distal basilar artery.  The stent was then advanced to the distal end of the microcatheter.  The proximal and the distal landing zones were then defined.  With slight forward gentle traction with the right hand on the delivery micro guidewire, with the left hand the delivery microcatheter were retrieved unsheathing first the distal and then the proximal stent.  The delivery microcatheter and the micro guidewire were then retrieved and removed without entanglement with the Neuroform stent.  A control arteriogram performed through the 5 [place] Catalyst guide catheter in the right vertebral artery demonstrated excellent apposition proximally and distally of the stent across the neck of the aneurysm.  Control arteriograms were performed at 15, 30 and 35 minutes post stent deployment. These continued to  demonstrate excellent apposition and flow without evidence of intraluminal filling defects, or of change in caliber of the posterior cerebral arteries, the superior cerebral arteries or the vertebral artery.  Also no evidence of extravasation or mass-effect or midline shift was seen.  She notes evidence of sudden surges in blood pressure were noted.  The 5 [place] Catalyst guide catheter was retrieved and removed. A final control arteriogram performed through the 8 French Neuroform sheath in the origin of the right vertebral artery demonstrated excellent flow through the intracranial and extracranial vertebral artery with no change in the posterior cerebral arteries or the superior cerebellar arteries. The stent was noted to be stable distally and proximally.  During the procedure, the patient's ACT was maintained in the region of approximately 200 seconds.  The 8 French Pinnacle sheath was then removed with the successful placement of an 8 French Angio-Seal closure device.  The right groin appeared soft without evidence of bleeding or hematoma. Distal pulses remained Dopplerable in the posterior  tibial, and the dorsalis pedis arteries bilaterally unchanged.  The patient's general anesthesia was then reversed, and the patient was extubated without event.  The patient was given 5 mg of protamine sulfate to partially reverse the effects of the heparin. Upon recovery, the patient denied any headaches, nausea, vomiting or visual difficulties. She responded to commands appropriately. She moved all extremities equally.  She was then transported to the recovery area intact. The patient was started on IV heparin with blood pressure in the region of 120-140 mmHg.  IMPRESSION: Status post endovascular staged embolization of large wide neck basilar apex aneurysm using the Neuroform Atlas stent as described.  PLAN: Follow-up in the clinic in 2 weeks.   Electronically Signed   By: Julieanne Cotton M.D.   On: 06/27/2019 17:04    Microbiology: Recent Results (from the past 240 hour(s))  MRSA PCR Screening     Status: None   Collection Time: 06/26/19  6:20 PM   Specimen: Nasal Mucosa; Nasopharyngeal  Result Value Ref Range Status   MRSA by PCR NEGATIVE NEGATIVE Final    Comment:        The GeneXpert MRSA Assay (FDA approved for NASAL specimens only), is one component of a comprehensive MRSA colonization surveillance program. It is not intended to diagnose MRSA infection nor to guide or monitor treatment for MRSA infections. Performed at [facility], [address], [place], [place] [address]   SARS CORONAVIRUS 2 (TAT 6-24 HRS) Nasopharyngeal Nasopharyngeal Swab     Status: None   Collection Time: 07/03/19  4:03 PM   Specimen: Nasopharyngeal Swab  Result Value Ref Range Status   SARS Coronavirus 2 NEGATIVE NEGATIVE Final    Comment: (NOTE) SARS-CoV-2 target nucleic acids are NOT DETECTED. The SARS-CoV-2 RNA is generally detectable in upper and lower respiratory specimens during the acute phase of infection. Negative results do not preclude SARS-CoV-2 infection, do not rule  out co-infections with other pathogens, and should not be used as the sole basis for treatment or other patient management decisions. Negative results must be combined with clinical observations, patient history, and epidemiological information. The expected result is Negative. Fact Sheet for Patients: HairSlick.no Fact Sheet for Healthcare Providers: quierodirigir.com This test is not yet approved or cleared by the [place] FDA and  has been authorized for detection and/or diagnosis of SARS-CoV-2 by FDA under an Emergency Use Authorization (EUA). This EUA will remain  in effect (meaning this test can be used) for the  duration of the COVID-19 declaration under Section 56 4(b)(1) of the Act, 21 U.S.C. section 360bbb-3(b)(1), unless the authorization is terminated or revoked sooner. Performed at [facility], [address] [place], [place] [address]      Labs: Basic Metabolic Panel: Recent Labs  Lab 06/28/19 0501 06/29/19 0548 06/30/19 0655 07/02/19 1547 07/03/19 1148 07/04/19 0250  NA 140 140 138 139 139  --   K 3.6 3.3* 3.8 3.6 4.1  --   CL 104 106 110 104 105  --   CO2 22 21* 23 28 25   --   GLUCOSE 86 81 119* 86 89  --   BUN 7 6 5* <5* 8  --   CREATININE 0.81 0.86 0.77 0.82 0.68 0.63  CALCIUM 8.5* 7.8* 8.3* 8.4* 8.6*  --    Liver Function Tests: Recent Labs  Lab 06/29/19 0548 07/02/19 1547 07/03/19 1148  AST 30 45* 42*  ALT 21 49* 48*  ALKPHOS 38 55 57  BILITOT 1.1 0.4 0.8  PROT 7.2 8.5* 8.4*  ALBUMIN 2.9* 3.4* 3.2*   No results for input(s): LIPASE, AMYLASE in the last 168 hours. No results for input(s): AMMONIA in the last 168 hours. CBC: Recent Labs  Lab 06/29/19 0548 06/30/19 0655 07/02/19 1547 07/03/19 1148 07/04/19 0250  WBC 5.3 5.1 4.4 3.5* 3.8*  NEUTROABS 3.3  --  2.5 1.7  --   HGB 11.6* 12.1* 12.4* 12.3* 12.0*  HCT 34.9* 36.2* 38.4* 37.8* 36.2*  MCV 82.7 83.6 86.9 87.3 84.4   PLT 89* 95* 125* 127* 133*   Cardiac Enzymes: No results for input(s): CKTOTAL, CKMB, CKMBINDEX, TROPONINI in the last 168 hours. BNP: BNP (last 3 results) No results for input(s): BNP in the last 8760 hours.  ProBNP (last 3 results) No results for input(s): PROBNP in the last 8760 hours.  CBG: No results for input(s): GLUCAP in the last 168 hours.     SignedGwenyth Bender:  Alvin Rubano M NP [facility] 07/04/2019, 1:29 PM

## 2019-07-04 NOTE — Evaluation (Signed)
Physical Therapy Evaluation Patient Details Name: Jacob Holder MRN: [id number] DOB: 1991-06-07 Today's Date: 07/04/2019   History of Present Illness  Pt is a 28 y.o. 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 Following coiling, pt developed acute vision loss and confusion. 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 Pt signed out AMA. Pt readmitted 07/03/2019 with memory issues and headache. 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   Clinical Impression  Pt admitted with above. Pt presents with right visual field impairment and short term memory deficits. Ambulating hallway distances independently without an assistive device; negotiated a flight of steps to simulate home set up. Education provided regarding compensatory techniques for visual deficit I.e. head turns and supervision with medication management. No PT follow up warranted. Pt with no further questions/concerns. PT signing off.     Follow Up Recommendations No PT follow up    Equipment Recommendations  None recommended by PT    Recommendations for Other Services       Precautions / Restrictions Precautions Precautions: Fall Restrictions Weight Bearing Restrictions: No      Mobility  Bed Mobility Overal bed mobility: Independent                Transfers Overall transfer level: Independent Equipment used: None                Ambulation/Gait Ambulation/Gait assistance: Independent Gait Distance (Feet): 300 Feet Assistive device: None Gait Pattern/deviations: WFL(Within Functional Limits)     General Gait Details: no apparent gait deficits or gross imbalance; able to find rooms/signage on right side of hall with increased time  Stairs            Wheelchair Mobility    Modified Rankin (Stroke Patients  Only) Modified Rankin (Stroke Patients Only) Pre-Morbid Rankin Score: No significant disability Modified Rankin: No significant disability     Balance Overall balance assessment: No apparent balance deficits (not formally assessed)                                           Pertinent Vitals/Pain Pain Assessment: No/denies pain    Home Living Family/patient expects to be discharged to:: Private residence Living Arrangements: Spouse/significant other Available Help at Discharge: Friend(s);Available PRN/intermittently Type of Home: Apartment Home Access: Stairs to enter Entrance Stairs-Rails: None Entrance Stairs-Number of Steps: 3rd floor Home Layout: One level Home Equipment: None Additional Comments: On medical leave currently but works [employer]. has to answer phone and computer date input. Pt must manage 20 calls in 4 hours    Prior Function Level of Independence: Independent               Hand Dominance        Extremity/Trunk Assessment   Upper Extremity Assessment Upper Extremity Assessment: Overall WFL for tasks assessed    Lower Extremity Assessment Lower Extremity Assessment: RLE deficits/detail;LLE deficits/detail RLE Deficits / Details: Strength 5/5 LLE Deficits / Details: Strength 5/5    Cervical / Trunk Assessment Cervical / Trunk Assessment: Normal  Communication   Communication: No difficulties  Cognition Arousal/Alertness: Awake/alert Behavior During Therapy: WFL for tasks assessed/performed Overall Cognitive Status: Impaired/Different from baseline Area of Impairment: Safety/judgement;Problem solving;Memory  Memory: Decreased short-term memory   Safety/Judgement: Decreased awareness of safety;Decreased awareness of deficits   Problem Solving: Slow processing;Requires verbal cues General Comments: 1/3 short term memory recall. pt with decreased awareness of deficits and overall health and  safety. He requires some additional cues with slow processing in multii step tasks. He has a hx of not taking meds due to forgetfulness      General Comments      Exercises     Assessment/Plan    PT Assessment Patent does not need any further PT services  PT Problem List Decreased cognition;Decreased safety awareness;Decreased balance       PT Treatment Interventions      PT Goals (Current goals can be found in the Care Plan section)  Acute Rehab PT Goals Patient Stated Goal: to go back to work PT Goal Formulation: All assessment and education complete, DC therapy    Frequency     Barriers to discharge        Co-evaluation PT/OT/SLP Co-Evaluation/Treatment: Yes Reason for Co-Treatment: To address functional/ADL transfers PT goals addressed during session: Mobility/safety with mobility OT goals addressed during session: ADL's and self-care;Other (comment)(vision with BADL)       AM-PAC PT "6 Clicks" Mobility  Outcome Measure Help needed turning from your back to your side while in a flat bed without using bedrails?: None Help needed moving from lying on your back to sitting on the side of a flat bed without using bedrails?: None Help needed moving to and from a bed to a chair (including a wheelchair)?: None Help needed standing up from a chair using your arms (e.g., wheelchair or bedside chair)?: None Help needed to walk in hospital room?: None Help needed climbing 3-5 steps with a railing? : None 6 Click Score: 24    End of Session   Activity Tolerance: Patient tolerated treatment well Patient left: in bed;with call bell/phone within reach Nurse Communication: Mobility status PT Visit Diagnosis: Unsteadiness on feet (R26.81);Difficulty in walking, not elsewhere classified (R26.2)    Time: 0037-0488 PT Time Calculation (min) (ACUTE ONLY): 21 min   Charges:   PT Evaluation $PT Eval Moderate Complexity: 1 Mod          Jacob Holder, [place], DPT Acute  Rehabilitation Services Pager [phone] Office [phone]   Jacob Holder 07/04/2019, 12:01 PM

## 2019-07-08 ENCOUNTER — Telehealth: Payer: Self-pay | Admitting: Infectious Diseases

## 2019-07-08 LAB — PANEL 083904
HIV 1 AB: POSITIVE — AB
HIV 2 AB: NEGATIVE

## 2019-07-08 NOTE — Telephone Encounter (Signed)
Patient with positive HIV-1 Ab confirming new HIV infection. Please send to DIS for linkage to care.  Thank you

## 2019-07-09 ENCOUNTER — Ambulatory Visit: Payer: Managed Care, Other (non HMO) | Attending: Internal Medicine | Admitting: Occupational Therapy

## 2019-07-09 ENCOUNTER — Encounter: Payer: Self-pay | Admitting: Internal Medicine

## 2019-07-09 ENCOUNTER — Other Ambulatory Visit: Payer: Self-pay

## 2019-07-09 DIAGNOSIS — R41844 Frontal lobe and executive function deficit: Secondary | ICD-10-CM | POA: Diagnosis not present

## 2019-07-09 DIAGNOSIS — B20 Human immunodeficiency virus [HIV] disease: Secondary | ICD-10-CM | POA: Insufficient documentation

## 2019-07-09 DIAGNOSIS — R41840 Attention and concentration deficit: Secondary | ICD-10-CM | POA: Insufficient documentation

## 2019-07-09 DIAGNOSIS — R41842 Visuospatial deficit: Secondary | ICD-10-CM

## 2019-07-10 ENCOUNTER — Telehealth (HOSPITAL_COMMUNITY): Payer: Self-pay | Admitting: Radiology

## 2019-07-10 NOTE — Telephone Encounter (Signed)
No I have not called the patient directly. I typically put in a note as a "documentation" encounter but evidently out of habit used a telephone note today.   I apologize for any confusion that may have caused. Typically we let the ordering provider have the conversation and we send information to disease intervention specialist team.   Thank you for your care.

## 2019-07-10 NOTE — Therapy (Addendum)
[facility] [address] [place], [place], [address] Phone: [phone]   Fax:  [phone]  Occupational Therapy Treatment  Patient Details  Name: Jacob Holder MRN: [id number] Date of Birth: [birth year] Referring Provider (OT): Dr. Eulogio Bear   Encounter Date: 07/09/2019  OT End of Session - 07/09/19 1456    Visit Number  1    Number of Visits  25    Date for OT Re-Evaluation  10/08/19    Authorization Type  [employer]    Authorization - Visit Number  1    Authorization - Number of Visits  41    OT Start Time  1450    OT Stop Time  8416    OT Time Calculation (min)  40 min       Past Medical History:  Diagnosis Date  . Aneurysm ([place])   . Anxiety   . Cocaine abuse ([place])   . Depression   . GERD (gastroesophageal reflux disease)   . Headache   . Stroke ([place])   . Wears contact lenses     Past Surgical History:  Procedure Laterality Date  . IR ANGIO INTRA EXTRACRAN SEL COM CAROTID INNOMINATE BILAT MOD SED  03/05/2019  . IR ANGIO VERTEBRAL SEL VERTEBRAL BILAT MOD SED  03/05/2019  . IR ANGIO VERTEBRAL SEL VERTEBRAL UNI R MOD SED  06/26/2019  . IR ANGIOGRAM FOLLOW UP STUDY  06/26/2019  . IR NEURO EACH ADD'L AFTER BASIC UNI RIGHT (MS)  06/26/2019  . IR TRANSCATH/EMBOLIZ  06/26/2019  . RADIOLOGY WITH ANESTHESIA N/A 06/26/2019   Procedure: EMBOLIZATION;  Surgeon: Luanne Bras, MD;  Location: [facility];  Service: Radiology;  Laterality: N/A;  . RECTAL SURGERY    . TOOTH EXTRACTION      There were no vitals filed for this visit.  Subjective Assessment - 07/09/19 1455    Currently in Pain?  Yes    Pain Score  4     Pain Location  Head    Pain Descriptors / Indicators  Aching    Pain Type  Acute pain    Pain Onset  1 to 4 weeks ago    Pain Frequency  Constant    Aggravating Factors   not taking meds    Pain Relieving Factors  rest         [facility] OT Assessment - 07/10/19 0001      Assessment   Medical  Diagnosis  s/p aneurysm coiling 06/26/19,    parietal occipital lobe and cerebellar infarcts   Referring Provider (OT)  Dr. Eulogio Bear    Hand Dominance  Left      Precautions   Precautions  --  HIV+ visual field deficit     Balance Screen   Has the patient fallen in the past 6 months  No    Has the patient had a decrease in activity level because of a fear of falling?   No    Is the patient reluctant to leave their home because of a fear of falling?   No      Home  Environment   Family/patient expects to be discharged to:  Private residence    [place]  Full time employment   currently out on leave, works for [employer] from home  Vocation Requirements  computer use, reading      ADL   Eating/Feeding  Modified independent    Grooming  Modified independent    Upper Body Bathing  Modified independent    Lower Body Bathing  Modified independent    Upper Body Dressing  Independent    Lower Body Dressing  Independent    Toilet Transfer  Modified independent    Tub/Shower Transfer  Modified independent      IADL   Shopping  Assistance for transportation    Light Housekeeping  Performs light daily tasks such as dishwashing, bed making    Meal Prep  Able to complete simple cold meal and snack prep      Mobility   Mobility Status  Independent      Vision Assessment   Tracking/Visual Pursuits  Able to track stimulus in all quads without difficulty    Visual Fields  Right visual field deficit    Patient has diffculty with activities due to visual impairment  Reading bills    Comment  Vision to be furhter assessed in a functional context, pt appears to have missing area, in right eye cental visual field.      Cognition   Overall Cognitive Status  Impaired/Different from baseline    Area of Impairment  Memory;Safety/judgement;Awareness;Problem  solving    Attention  Selective    Selective Attention  --   impaired alternating attention per pt   Memory  Impaired    Problem Solving  Impaired    Executive Function  Sequencing;Organizing    Sequencing  Impaired    Organizing  Impaired      Coordination   Fine Motor Movements are Fluid and Coordinated  Yes    9 Hole Peg Test  Right;Left    Right 9 Hole Peg Test  24.59    Left 9 Hole Peg Test  31.37      Hand Function   Right Hand Grip (lbs)  70.9    Left Hand Grip (lbs)  89.2                         OT Short Term Goals - 07/10/19 1520      OT SHORT TERM GOAL #1   Title  Pt will verbalize understanding of compensatory strategies for visual perceptual deficits.-08/24/19    Time  6    Period  Weeks    Status  New    Target Date  08/24/19      OT SHORT TERM GOAL #2   Title  Pt will verbalize understanding of compensatory strategies for short term memory deficits.    Time  6    Period  Weeks    Status  New      OT SHORT TERM GOAL #3   Title  Pt will perform environmental scanning with 80% or better accuracy.    Time  6    Period  Weeks    Status  New      OT SHORT TERM GOAL #4   Title  Pt will perfom basic cooking modified indpendently demonstrating good safety awareness.    Time  6    Period  Weeks    Status  New      OT SHORT TERM GOAL #5   Title  Pt will perform tabletop scanning activities with 95% or better accuracy.    Time  6    Period  Weeks    Status  New        OT Long Term Goals - 07/10/19 1010      OT LONG TERM GOAL #1   Title  Pt will perform mod complex environmantal scanning tasks with 90% or better accuracy.    Time  12    Period  Weeks    Status  New      OT LONG TERM GOAL #2   Title  Pt will perfrom simulated work tasks with 90% or better accuracy.    Time  12    Period  Weeks    Status  New      OT LONG TERM GOAL #3   Title  Pt will perform a physical and cognitive task simultaneously in prep for work  activities/ driving.    Time  12    Period  Weeks    Status  New      OT LONG TERM GOAL #4   Title  Pt will perform mod complex cooking modified indpendently demonstrating good safety awareness.    Time  12    Period  Weeks    Status  New            Plan - 07/10/19 1000    Clinical Impression Statement  Pt is a 29 y.o. 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 Following coiling, pt developed acute vision loss and confusion. MRI at the time showing scattered acute infarcts in the bilateral parietal lobes, left occipital lobe and right cerebellum in addition to [facility] and IVH. Pt signed out AMA. Pt readmitted 07/03/2019 with memory issues and headache. MRI showing new infarct involing the left cerebellum. Pt presents to occupational therapy with the following deficits: cognitive deficits, and  visual deficitswhich impedes performance of ADLs/ IADLs. Pt can benefit from skilled occupational therapy to address these deficits in order to maximize pt's safety and indpendence with daily activities. Pt was working from home for [employer] prior to hospitalization. Pt has not resumed cooking using stov    OT Occupational Profile and History  Detailed Assessment- Review of Records and additional review of physical, cognitive, psychosocial history related to current functional performance    Occupational performance deficits (Please refer to evaluation for details):  ADL's;IADL's;Work;Play;Leisure;Social Participation    Body Structure / Function / Physical Skills  ADL;Vision;Decreased knowledge of precautions    Cognitive Skills  Attention;Memory;Orientation;Problem Solve;Perception;Safety Awareness;Sequencing;Thought;Understand    Rehab Potential  Good    Clinical Decision Making  Limited treatment options, no task modification necessary    Comorbidities Affecting Occupational Performance:  May have comorbidities impacting occupational  performance    Modification or Assistance to Complete Evaluation   No modification of tasks or assist necessary to complete eval    OT Frequency  2x / week   plus eval, may d/c after 8 weeks dep on progress.   OT Duration  12 weeks    OT Treatment/Interventions  Self-care/ADL training;Visual/perceptual remediation/compensation;Patient/family education;Therapeutic activities;Therapeutic exercise;Cognitive remediation/compensation    Plan  environmental scanning, further assess visual perceptual skills in a functional context, request ST order for word finding difficulties   Consulted and Agree with Plan of Care  Patient       Patient will benefit from skilled therapeutic intervention in order to improve the following deficits and impairments:   Body Structure / Function / Physical Skills: ADL, Vision, Decreased knowledge of precautions Cognitive Skills: Attention, Memory, Orientation, Problem Solve, Perception, Safety Awareness, Sequencing, Thought, Understand  Visit Diagnosis: Frontal lobe and executive function deficit - Plan: Ot plan of care cert/re-cert  Attention and concentration deficit - Plan: Ot plan of care cert/re-cert  Visuospatial deficit - Plan: Ot plan of care cert/re-cert    Problem List Patient Active Problem List   Diagnosis Date Noted  . HIV disease (HCC) 07/09/2019  . Normochromic normocytic anemia 07/04/2019  . Stroke (HCC) 07/04/2019  . Drug use 07/04/2019  . Cocaine abuse (HCC)   . Acute CVA (cerebrovascular accident) (HCC) 07/03/2019  . Hypokalemia   . History of ETT   . SAH (subarachnoid hemorrhage) (HCC) 06/27/2019  . IVH (intraventricular hemorrhage) (HCC) 06/27/2019  . 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 Aphasia 06/27/2019  . 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/26/2019  . Acute respiratory failure with hypoxemia (HCC)   . Acute  metabolic encephalopathy   . Essential hypertension     Dewana Ammirati 07/10/2019, 3:58 PM  [facility] [address] [place], [place], [address] Phone: [phone]   Fax:  [phone]  Name: Rhyse Loux MRN: [id number] Date of Birth: 17-Dec-1990

## 2019-07-10 NOTE — Telephone Encounter (Signed)
Called and check in to see how patient was doing. He states that he feels like his memory is getting better, slowly but surely. He seems to be feeling better mentally as well. He sounds much more secure and confident and much less anxious. He will be seen for follow-up on 07/15/19 by Dr. Corliss Skains. JM

## 2019-07-11 ENCOUNTER — Ambulatory Visit: Payer: Managed Care, Other (non HMO) | Admitting: Occupational Therapy

## 2019-07-11 ENCOUNTER — Other Ambulatory Visit: Payer: Self-pay

## 2019-07-11 DIAGNOSIS — R41844 Frontal lobe and executive function deficit: Secondary | ICD-10-CM | POA: Diagnosis not present

## 2019-07-11 DIAGNOSIS — R41842 Visuospatial deficit: Secondary | ICD-10-CM

## 2019-07-11 DIAGNOSIS — R41840 Attention and concentration deficit: Secondary | ICD-10-CM

## 2019-07-11 NOTE — Telephone Encounter (Signed)
Patient information sent to DIS for linkage to care.

## 2019-07-11 NOTE — Therapy (Signed)
[facility] [address] [place], [place], [address] Phone: [phone]   Fax:  [phone]  Occupational Therapy Treatment  Patient Details  Name: Jacob Holder MRN: [id number] Date of Birth: 01/03/1991 Referring Provider (OT): Dr. Marlin Canary   Encounter Date: 07/11/2019  OT End of Session - 07/11/19 1059    Visit Number  2    Number of Visits  25    Date for OT Re-Evaluation  10/08/19    Authorization Type  [employer]ign[employer]    Authorization - Visit Number  2    Authorization - Number of Visits  60    OT Start Time  1022    OT Stop Time  1100    OT Time Calculation (min)  38 min       Past Medical History:  Diagnosis Date  . Aneurysm (HCC)   . Anxiety   . Cocaine abuse (HCC)   . Depression   . GERD (gastroesophageal reflux disease)   . Headache   . Stroke (HCC)   . Wears contact lenses     Past Surgical History:  Procedure Laterality Date  . IR ANGIO INTRA EXTRACRAN SEL COM CAROTID INNOMINATE BILAT MOD SED  03/05/2019  . IR ANGIO VERTEBRAL SEL VERTEBRAL BILAT MOD SED  03/05/2019  . IR ANGIO VERTEBRAL SEL VERTEBRAL UNI R MOD SED  06/26/2019  . IR ANGIOGRAM FOLLOW UP STUDY  06/26/2019  . IR NEURO EACH ADD'L AFTER BASIC UNI RIGHT (MS)  06/26/2019  . IR TRANSCATH/EMBOLIZ  06/26/2019  . RADIOLOGY WITH ANESTHESIA N/A 06/26/2019   Procedure: EMBOLIZATION;  Surgeon: Julieanne Cotton, MD;  [facility];  Service: Radiology;  Laterality: N/A;  . RECTAL SURGERY    . TOOTH EXTRACTION      There were no vitals filed for this visit.  Subjective Assessment - 07/11/19 1023    Subjective   Pt reports he is still having word finding difficulties    Pertinent History  Pt is a 29 y.o. 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 Following coiling, pt developed acute vision loss and confusion. MRI at the time showing scattered acute infarcts in the bilateral  parietal lobes, left occipital lobe and right cerebellum in addition to [facility] and IVH. Pt signed out AMA. Pt readmitted 07/03/2019 with memory issues and headache. MRI showing new infarct invovling the left cerebellum    Patient Stated Goals  to improve vision and thinking abilities    Currently in Pain?  No/denies             Treatment: Environmental scanning 17/19 located first pass, pt located remaining 2 on second pass Completing a 12 piece puzzle, mod difficulty initially, then therapist assisted pt with organizing task by helping pt locate corners. Pt was able to complete without additional prompting. Education performed regarding vision compensation strategies, and activities to perform at home. Scheduling activity with pt reading and answering questions regarding the schedule, increased time and 100% accuracy.                OT Short Term Goals - 07/10/19 1520      OT SHORT TERM GOAL #1   Title  Pt will verbalize understanding of compensatory strategies for visual perceptual deficits.-08/24/19    Time  6    Period  Weeks    Status  New    Target Date  08/24/19      OT SHORT TERM  GOAL #2   Title  Pt will verbalize understanding of compensatory strategies for short term memory deficits.    Time  6    Period  Weeks    Status  New      OT SHORT TERM GOAL #3   Title  Pt will perform environmental scanning with 80% or better accuracy.    Time  6    Period  Weeks    Status  New      OT SHORT TERM GOAL #4   Title  Pt will perfom basic cooking modified indpendently demonstrating good safety awareness.    Time  6    Period  Weeks    Status  New      OT SHORT TERM GOAL #5   Title  Pt will perform tabletop scanning activities with 95% or better accuracy.    Time  6    Period  Weeks    Status  New        OT Long Term Goals - 07/10/19 1010      OT LONG TERM GOAL #1   Title  Pt will perform mod complex environmantal scanning tasks with 90% ofr better  accuracy.    Time  12    Period  Weeks    Status  New      OT LONG TERM GOAL #2   Title  Pt will perfrom simulated work tasks with 90% or better accuracy.    Time  12    Period  Weeks    Status  New      OT LONG TERM GOAL #3   Title  Pt will perform a physical and cognitive task simultaneously in prep for work activities/ driving.    Time  12    Period  Weeks    Status  New      OT LONG TERM GOAL #4   Title  Pt will perform mod complex cooking modified indpendently demonstrating good safety awareness.    Time  12    Period  Weeks    Status  New              Patient will benefit from skilled therapeutic intervention in order to improve the following deficits and impairments:           Visit Diagnosis: Frontal lobe and executive function deficit  Attention and concentration deficit  Visuospatial deficit    Problem List Patient Active Problem List   Diagnosis Date Noted  . HIV disease ([place]) 07/09/2019  . Normochromic normocytic anemia 07/04/2019  . Stroke ([place]) 07/04/2019  . Drug use 07/04/2019  . Cocaine abuse ([place])   . Acute CVA (cerebrovascular accident) ([place]) 07/03/2019  . Hypokalemia   . History of ETT   . SAH (subarachnoid hemorrhage) ([place]) 06/27/2019  . IVH (intraventricular hemorrhage) ([place]) 06/27/2019  . Cerebrovascular accident (CVA) due to embolism of posterior cerebral artery with infarctions of both occipital lobes ([place]) 06/27/2019  . 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 Aphasia 06/27/2019  . 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/26/2019  . Acute respiratory failure with hypoxemia ([place])   . Acute metabolic encephalopathy   . Essential hypertension     Vicent Febles 07/11/2019, 10:59 AM  [facility] [address] [place], [place], [address] Phone: [phone]   Fax:  [phone]  Name: Donta Fuster MRN: [id number] Date of Birth: Jun 13, 1991

## 2019-07-11 NOTE — Patient Instructions (Signed)
Visual scanning strategies:   1. Look for the edge of objects (to the left and/or right) so that you make sure you are seeing all of an object 2. Turn your head when walking, scan from side to side, particularly in busy environments 3. Use an organized scanning pattern. It's usually easier to scan from top to bottom, and left to right (like you are reading) 4. Double check yourself 5. Use a line guide (like a blank piece of paper) or your finger when reading  Activities to try at home to encourage visual scanning:   1. Word searches 2. Mazes 3. Puzzles 4. Card games 5. Computer games and/or searches 6. Connect-the-dots  Activities for environmental (larger) scanning:  1. With supervision, scan for items in grocery store or drugstore.  Begin with a familiar store, then progress to a new store you've never been in before. Make sure you have supervision with this.

## 2019-07-12 NOTE — Addendum Note (Signed)
Addendum  created 07/12/19 1136 by Adair Laundry, CRNA   Order list changed

## 2019-07-15 ENCOUNTER — Other Ambulatory Visit: Payer: Self-pay

## 2019-07-15 ENCOUNTER — Ambulatory Visit (HOSPITAL_COMMUNITY)
Admission: RE | Admit: 2019-07-15 | Discharge: 2019-07-15 | Disposition: A | Discharge status: Home / Self Care | Payer: Managed Care, Other (non HMO) | Source: Ambulatory Visit | Attending: Interventional Radiology | Admitting: Interventional Radiology

## 2019-07-15 DIAGNOSIS — I671 Cerebral aneurysm, nonruptured: Secondary | ICD-10-CM

## 2019-07-15 NOTE — Progress Notes (Signed)
Chief Complaint: Patient was seen in consultation today for basilar apex aneurysm s/p staged embolization.  Referring Physician(s): Drema DallasJaffe, Adam R  Supervising Physician: Julieanne Cottoneveshwar, Sanjeev  [facility]  History of Present Illness: Jacob LamasDominique Kamrowski is a 29 y.o. 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 [facility] and has been followed by Dr. Corliss Skainseveshwar since 03/2019. He first presented to our department at the request of Dr. Everlena CooperJaffe for management of a basilar apex aneurysm. He underwent an image-guided cerebral arteriogram with staged embolization of basilar apex aneurysm using a neuroform ATLAS stent 06/26/2019 by Dr. Corliss Skainseveshwar. Shortly following procedure, patient became combative and MR brain revealed SAH and small volume IVH of the basilar cistern. Of note, UDS positive for cocaine. He was admitted for further management. However, despite multiple conversations with patient, Dr. Corliss Skainseveshwar, neurology, and CCM, [facility]  Patient presents today for follow-up regarding recent procedure 06/26/2019. Patient awake and alert sitting in chair. Complains of memory issues, improved since patient left [facility]. Complains of "worsening vision". Denies headaches, weakness, numbness/tingling, dizziness, seizure activity, hearing changes, tinnitus, or speech difficulty.  Currently taking Plavix 75 mg once daily and Aspirin 81 mg once daily.   Past Medical History:  Diagnosis Date   Aneurysm (HCC)    Anxiety    Cocaine abuse (HCC)    Depression    GERD (gastroesophageal reflux disease)    Headache    Stroke (HCC)    Wears contact lenses     Past Surgical History:  Procedure Laterality Date   IR ANGIO INTRA EXTRACRAN SEL COM CAROTID INNOMINATE BILAT MOD SED  03/05/2019   IR ANGIO VERTEBRAL SEL VERTEBRAL BILAT MOD SED  03/05/2019     IR ANGIO VERTEBRAL SEL VERTEBRAL UNI R MOD SED  06/26/2019   IR ANGIOGRAM FOLLOW UP STUDY  06/26/2019   IR NEURO EACH ADD'L AFTER BASIC UNI RIGHT (MS)  06/26/2019   IR TRANSCATH/EMBOLIZ  06/26/2019   RADIOLOGY WITH ANESTHESIA N/A 06/26/2019   Procedure: EMBOLIZATION;  Surgeon: Julieanne Cottoneveshwar, Sanjeev, MD;  Location: [facility] OR;  Service: Radiology;  Laterality: N/A;   RECTAL SURGERY     TOOTH EXTRACTION      Allergies: Patient has no known allergies.  Medications: Prior to Admission medications   Medication Sig Start Date End Date Taking? Authorizing Provider  aspirin 81 MG chewable tablet Chew 81 mg by mouth daily.    [provider]  atorvastatin (LIPITOR) 40 MG tablet Take 1 tablet (40 mg total) by mouth daily at 6 PM. 07/04/19   Black, Lesle ChrisKaren M, NP  busPIRone (BUSPAR) 5 MG tablet Take 1 tablet (5 mg total) by mouth 2 (two) times daily. 07/04/19   Black, Lesle ChrisKaren M, NP  clopidogrel (PLAVIX) 75 MG tablet Take 75 mg by mouth daily.     [provider]  ibuprofen (ADVIL) 200 MG tablet Take 600 mg by mouth every 6 (six) hours as needed for headache.    [provider]  verapamil (CALAN) 80 MG tablet Take 1 tablet (80 mg total) by mouth 3 (three) times daily. 02/20/19   Drema DallasJaffe, Adam R, DO     Family History  Problem Relation Age of Onset   Healthy Mother    Thyroid disease Mother    Diabetes Father     Social History   Socioeconomic History   Marital status: Single  Spouse name: Not on file   Number of children: 0   Years of education: Not on file   Highest education level: Some college, no degree  Occupational History   Occupation: unemployed  Tobacco Use   Smoking status: Current Every Day Smoker    Packs/day: 0.50    Types: Cigarettes   Smokeless tobacco: Never Used  Substance and Sexual Activity   Alcohol use: Yes    Comment: from time to time   Drug use: No   Sexual activity: Yes    Birth control/protection: Condom  Other  Topics Concern   Not on file  Social History Narrative   Patient is left-handed. He lives in a 2 level home. He drinks tea 2-3 glasses a day, and coffee occassionally.   Social Determinants of Health   Financial Resource Strain:    Difficulty of Paying Living Expenses: Not on file  Food Insecurity:    Worried About Charity fundraiser in the Last Year: Not on file   [employer] of Food in the Last Year: Not on file  Transportation Needs:    Lack of Transportation (Medical): Not on file   Lack of Transportation (Non-Medical): Not on file  Physical Activity:    Days of Exercise per Week: Not on file   Minutes of Exercise per Session: Not on file  Stress:    Feeling of Stress : Not on file  Social Connections:    Frequency of Communication with Friends and Family: Not on file   Frequency of Social Gatherings with Friends and Family: Not on file   Attends Religious Services: Not on file   Active Member of Clubs or Organizations: Not on file   Attends Archivist Meetings: Not on file   Marital Status: Not on file     Review of Systems: A 12 point ROS discussed and pertinent positives are indicated in the HPI above.  All other systems are negative.  Review of Systems  Constitutional: Negative for chills and fever.  HENT: Negative for hearing loss and tinnitus.   Eyes: Positive for visual disturbance.  Respiratory: Negative for shortness of breath and wheezing.   Cardiovascular: Negative for chest pain and palpitations.  Neurological: Negative for dizziness, seizures, speech difficulty, weakness, numbness and headaches.  Psychiatric/Behavioral: Negative for behavioral problems and confusion.    Vital Signs: There were no vitals taken for this visit.  Physical Exam Constitutional:      General: He is not in acute distress.    Appearance: Normal appearance.  Pulmonary:     Effort: Pulmonary effort is normal. No respiratory distress.  Skin:    General:  Skin is warm and dry.  Neurological:     Mental Status: He is alert and oriented to person, place, and time.      Imaging: EEG  Result Date: 06/26/2019 Lora Havens, MD     06/26/2019  4:45 PM Patient Name: Marquee Fuchs MRN: [id number] Epilepsy Attending: Lora Havens Referring Physician/Provider: Etta Quill, PA Date: 06/26/2019 Duration: 22.25 mins Patient history: 29 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  EEG to evaluate for seizures. Level of alertness: Awake AEDs during EEG study: None Technical aspects: This EEG study was done with scalp electrodes positioned according to the 10-20 International system of electrode placement. Electrical activity was acquired at a sampling rate of 500Hz  and reviewed with a high frequency filter of 70Hz  and a low frequency filter  of 1Hz . EEG data were recorded continuously and digitally stored. Description: EEG was technically difficult due to significant myogenic artifact. No clear posterior dominant rhythm was seen. EEG showed continuous generalized 3-5 hz theta-delta slowing. Hyperventilation and photic stimulation were not performed. Abnormality - Continuous slow, generalized IMPRESSION: This technically difficult study is suggestive of moderate diffuse encephalopathy, non specific to etiology. No definite seizures or epileptiform discharges were seen throughout the recording. Charlsie Quest   CT Angio Head W or Wo Contrast  Result Date: 07/03/2019 CLINICAL DATA:  Subarachnoid hemorrhage [facility]), follow-up. EXAM: CT ANGIOGRAPHY HEAD AND NECK TECHNIQUE: Multidetector CT imaging of the head and neck was performed using the standard protocol during bolus administration of intravenous contrast. Multiplanar CT image reconstructions and MIPs were obtained to evaluate the vascular anatomy. Carotid stenosis measurements (when applicable) are obtained utilizing NASCET criteria, using the  distal internal carotid diameter as the denominator. CONTRAST:  75mL OMNIPAQUE IOHEXOL 350 MG/ML SOLN COMPARISON:  CT head performed 1 day earlier 07/02/2019, head CT 06/28/2019, head CT 06/27/2019, brain MRI 06/27/2019, CT angiogram head/neck 06/26/2019. FINDINGS: CT HEAD FINDINGS Brain: Known recent bilateral parietooccipital and right cerebellar infarcts were better appreciated on prior examinations. No CT evidence of new acute infarct. No evidence of acute intracranial hemorrhage. No evidence of internal mass. No midline shift or extra-axial fluid collection. Vascular: Reported separately Skull: Normal. Negative for fracture or focal lesion. Sinuses: Minimal ethmoid sinus mucosal thickening. No significant mastoid effusion. Orbits: Visualized orbits demonstrate no acute abnormality. Review of the MIP images confirms the above findings CTA NECK FINDINGS Aortic arch: Standard aortic branching. Right carotid system: CCA and ICA patent within the neck without stenosis. Left carotid system: CCA and ICA patent within the neck without stenosis. Vertebral arteries: The vertebral arteries are codominant and patent throughout the neck without significant stenosis. Skeleton: No acute bony abnormality. Other neck: No soft tissue neck mass or cervical lymphadenopathy. Upper chest: No consolidation within the imaged lung apices. Review of the MIP images confirms the above findings CTA HEAD FINDINGS Anterior circulation: The intracranial internal carotid arteries are patent without significant stenosis. The bilateral middle and anterior cerebral arteries are patent without proximal branch occlusion or proximal high-grade stenosis. Hypoplastic A1 left anterior cerebral artery. Redemonstrated 2 mm infundibulum projecting inferiorly from the supraclinoid right internal carotid artery. No anterior circulation aneurysm is identified. Posterior circulation: The intracranial vertebral arteries are patent without significant stenosis.  The vertebrobasilar junction is normal. Redemonstrated stent visualized from the mid basilar artery extending into the left posterior cerebral artery P2 segment. Unchanged, there is decreased caliber of the P2 left posterior cerebral artery just distal to the stent. This appearance is likely exaggerated due to artifact. Distal to this, no occlusion or high-grade stenosis is demonstrated within the P2 left PCA. The right posterior cerebral artery is patent without significant proximal stenosis. Venous sinuses: Within limitations of contrast timing, no convincing thrombus. Anatomic variants: As described Review of the MIP images confirms the above findings IMPRESSION: CT head: 1. Known recent bilateral parietooccipital and right cerebellar infarcts better appreciated on prior exams. 2. No evidence of acute intracranial hemorrhage or new acute infarct. CTA neck: The common carotid, internal carotid and vertebral arteries are patent within the neck without significant stenosis. CTA head: 1. Unchanged appearance of the intracranial arterial circulation as compared to CTA head 05/27/2019. Redemonstrated stent extending from the mid basilar artery into the P2 left PCA. As before, there is decreased caliber of the left P2 segment just beyond  the stent, likely exaggerated by artifact. 2. The remaining branch vessels of the circle-of-Willis are unremarkable without proximal occlusion or high-grade proximal stenosis. Electronically Signed   By: Jackey Loge DO   On: 07/03/2019 14:21   CT ANGIO HEAD W OR WO CONTRAST  Result Date: 06/26/2019 CLINICAL DATA:  Loss of vision after neuro interventional procedure. Placement neuroform atlas stent across a wide necked basilar tip aneurysm. EXAM: CT ANGIOGRAPHY HEAD AND NECK TECHNIQUE: Multidetector CT imaging of the head and neck was performed using the standard protocol during bolus administration of intravenous contrast. Multiplanar CT image reconstructions and MIPs were  obtained to evaluate the vascular anatomy. Carotid stenosis measurements (when applicable) are obtained utilizing NASCET criteria, using the distal internal carotid diameter as the denominator. CONTRAST:  75mL OMNIPAQUE IOHEXOL 350 MG/ML SOLN COMPARISON:  None. FINDINGS: CTA NECK FINDINGS Aortic arch: A 3 vessel arch configuration is present. No significant atherosclerotic changes are present. No aneurysm or stenosis. Right carotid system: The right common carotid artery is within normal limits. Bifurcation is unremarkable. Cervical right ICA is normal. Left carotid system: The left common carotid artery is within normal limits. Bifurcation is unremarkable. The cervical left ICA is normal. Vertebral arteries: The vertebral arteries are codominant. There is no significant stenosis in either vertebral artery in the neck. Vertebral arteries originate the subclavian arteries without significant stenosis. Skeleton: Vertebral body heights alignment are maintained. Focal lytic or blastic lesions are present. Other neck: Soft tissues the neck are otherwise unremarkable. Upper chest: Lung apices are clear.  Thoracic inlet is normal. Review of the MIP images confirms the above findings CTA HEAD FINDINGS Anterior circulation: The internal carotid arteries are within normal limits through the ICA termini bilaterally. 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 Both A2 segments fill from right. M1 segments are normal bilaterally. MCA bifurcations are normal. ACA and MCA branch vessels are within normal limits. No significant proximal stenosis or occlusion is present. Posterior circulation: The vertebral arteries are codominant. PICA origins are visualized and normal. Vertebrobasilar junction is normal. Stent is visualized from the distal basilar artery into the left P2 segment. There is decreased caliber of the vessel just distal to the stent. The appearance is likely exaggerated due to artifact. Distal branch vessels fill in the left PCA  branches. Right PCA branches are within normal limits. Venous sinuses: The dural sinuses are patent. The straight sinus and deep cerebral veins are intact. Cortical veins are within normal limits. No vascular malformation is evident. Anatomic variants: None Review of the MIP images confirms the above findings IMPRESSION: 1. Arterial stent from the mid basilar artery into the left P2 segment. 2. Decreased caliber of the left P2 segment just beyond the stent is likely exaggerated by artifact. 3. No distal branch vessel occlusion to explain visual loss. 4. No occlusive disease involving the right PCA branch vessels. 5. No acute infarct is evident. 6. Normal CTA of the neck. These results were called by telephone at the time of interpretation on 06/26/2019 at 2:26 pm to provider [facility], who verbally acknowledged these results. Electronically Signed   By: Marin Roberts M.D.   On: 06/26/2019 14:40   CT HEAD WO CONTRAST  Result Date: 07/02/2019 CLINICAL DATA:  Possible stroke, focal neuro deficit, greater than 6 hours, stroke suspected. Additional history provided: Lapses and memory, difficulty performing tasks, headache EXAM: CT HEAD WITHOUT CONTRAST TECHNIQUE: Contiguous axial images were obtained from the base of the skull through the vertex without intravenous contrast. COMPARISON:  Head CT examinations 06/28/2019 and earlier, brain MRI 06/27/2019, CT angiogram head 06/26/2019 FINDINGS: Brain: Known recent bilateral parietooccipital and right cerebellar infarcts were better appreciated on prior examinations. No CT evidence of new acute infarct. Previously demonstrated subarachnoid and intraventricular hemorrhage is no longer appreciated. No evidence of intracranial mass. No midline shift or extra-axial fluid collection. Cerebral volume is normal for age. Vascular: Redemonstrated stent extending from the mid basilar artery into the left posterior cerebral artery. No vascular calcifications. Skull:  Normal. Negative for fracture or focal lesion. Sinuses/Orbits: Visualized orbits demonstrate no acute abnormality. No significant paranasal sinus disease or mastoid effusion at the imaged levels. IMPRESSION: Known recent bilateral parietooccipital and right cerebellar infarcts were better appreciated on prior examinations. No CT evidence of new acute infarct. Consider brain MRI for further evaluation, as clinically warranted. Previously demonstrated subarachnoid and intraventricular hemorrhage is no longer appreciated. Redemonstrated basilar artery/left posterior cerebral artery stent. Electronically Signed   By: Jackey Loge DO   On: 07/02/2019 18:15   CT HEAD WO CONTRAST  Result Date: 06/28/2019 CLINICAL DATA:  Nausea and vomiting. Subarachnoid hemorrhage. EXAM: CT HEAD WITHOUT CONTRAST TECHNIQUE: Contiguous axial images were obtained from the base of the skull through the vertex without intravenous contrast. COMPARISON:  MR head without contrast 06/27/2019. CTA head and neck 07/26/2018 FINDINGS: Brain: Fluid levels in the ventricles are less well seen than prior exam. 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 Infarct along medial aspect of the left occipital lobe noted. A right PICA territory infarct is noted. There is expected increasing hypoattenuation within each of these infarcts. No new infarcts are present. There is no expansion of suspected hemorrhage. Stent is noted. Anterior circulation territories are within limits. Vascular: Basilar artery to left PCA stent is again noted. No vascular calcifications are present. Skull: Calvarium is intact. No focal lytic or blastic lesions are present. No significant extracranial soft tissue lesion is present. Sinuses/Orbits: The paranasal sinuses and mastoid air cells are clear. The globes and orbits are within normal limits. IMPRESSION: 1. 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 2. Expected evolution of right PICA  territory infarct. 3. No new infarcts. 4. Stable appearance of distal basilar artery to left PCA stent. 5. Decreasing intraventricular blood products. 6. No new hemorrhage. Electronically Signed   By: Marin Roberts M.D.   On: 06/28/2019 14:44   CT HEAD WO CONTRAST  Result Date: 06/27/2019 CLINICAL DATA:  29 year old male status post basilar tip aneurysm stenting with loss of vision. Follow-up MRI reveals evidence of basilar subarachnoid hemorrhage and IVH. EXAM: CT HEAD WITHOUT CONTRAST TECHNIQUE: Contiguous axial images were obtained from the base of the skull through the vertex without intravenous contrast. COMPARISON:  Brain MRI 0335 hours today and earlier. FINDINGS: Brain: Small volume intraventricular hemorrhage in the occipital horns and 4th ventricle, also visible by MRI. Basilar cistern subarachnoid hemorrhage primarily on the left: Interpeduncular, ambient cistern, prepontine, left CP angle, and pre medullary. The CT appearance is less convincing for subdural hematoma at the cisterna magna. Stable ventricle size, no ventriculomegaly. Patchy somewhat subtle hypodensity in the posterior parietal lobes, left occipital lobe and right cerebellum corresponding to the acute infarcts by MRI. No parenchymal hemorrhage. No significant intracranial mass effect at this time. Gray-white matter differentiation elsewhere remains within normal limits. Vascular: Basilar artery through left PCA vascular stent is evident. Skull: No acute osseous abnormality identified. Sinuses/Orbits: Visualized paranasal sinuses and mastoids are stable and well pneumatized. Other: Intubated. Fluid in the pharynx. No acute  orbit or scalp soft tissue finding. IMPRESSION: 1. Basilar cistern subarachnoid hemorrhage is primarily on the left and stable from the MRI earlier today. No superimposed subdural hematoma suspected. No intra-axial blood identified. 2. Trace associated IVH. No ventriculomegaly or significant intracranial mass  effect at this time. 3. Patchy hypodensity associated with the acute parietal, left occipital, and right cerebellar infarcts. Electronically Signed   By: Odessa Fleming M.D.   On: 06/27/2019 07:21   CT Angio Neck W and/or Wo Contrast  Result Date: 07/03/2019 CLINICAL DATA:  Subarachnoid hemorrhage [facility]), follow-up. EXAM: CT ANGIOGRAPHY HEAD AND NECK TECHNIQUE: Multidetector CT imaging of the head and neck was performed using the standard protocol during bolus administration of intravenous contrast. Multiplanar CT image reconstructions and MIPs were obtained to evaluate the vascular anatomy. Carotid stenosis measurements (when applicable) are obtained utilizing NASCET criteria, using the distal internal carotid diameter as the denominator. CONTRAST:  75mL OMNIPAQUE IOHEXOL 350 MG/ML SOLN COMPARISON:  CT head performed 1 day earlier 07/02/2019, head CT 06/28/2019, head CT 06/27/2019, brain MRI 06/27/2019, CT angiogram head/neck 06/26/2019. FINDINGS: CT HEAD FINDINGS Brain: Known recent bilateral parietooccipital and right cerebellar infarcts were better appreciated on prior examinations. No CT evidence of new acute infarct. No evidence of acute intracranial hemorrhage. No evidence of internal mass. No midline shift or extra-axial fluid collection. Vascular: Reported separately Skull: Normal. Negative for fracture or focal lesion. Sinuses: Minimal ethmoid sinus mucosal thickening. No significant mastoid effusion. Orbits: Visualized orbits demonstrate no acute abnormality. Review of the MIP images confirms the above findings CTA NECK FINDINGS Aortic arch: Standard aortic branching. Right carotid system: CCA and ICA patent within the neck without stenosis. Left carotid system: CCA and ICA patent within the neck without stenosis. Vertebral arteries: The vertebral arteries are codominant and patent throughout the neck without significant stenosis. Skeleton: No acute bony abnormality. Other neck: No soft tissue neck mass or  cervical lymphadenopathy. Upper chest: No consolidation within the imaged lung apices. Review of the MIP images confirms the above findings CTA HEAD FINDINGS Anterior circulation: The intracranial internal carotid arteries are patent without significant stenosis. The bilateral middle and anterior cerebral arteries are patent without proximal branch occlusion or proximal high-grade stenosis. Hypoplastic A1 left anterior cerebral artery. Redemonstrated 2 mm infundibulum projecting inferiorly from the supraclinoid right internal carotid artery. No anterior circulation aneurysm is identified. Posterior circulation: The intracranial vertebral arteries are patent without significant stenosis. The vertebrobasilar junction is normal. Redemonstrated stent visualized from the mid basilar artery extending into the left posterior cerebral artery P2 segment. Unchanged, there is decreased caliber of the P2 left posterior cerebral artery just distal to the stent. This appearance is likely exaggerated due to artifact. Distal to this, no occlusion or high-grade stenosis is demonstrated within the P2 left PCA. The right posterior cerebral artery is patent without significant proximal stenosis. Venous sinuses: Within limitations of contrast timing, no convincing thrombus. Anatomic variants: As described Review of the MIP images confirms the above findings IMPRESSION: CT head: 1. Known recent bilateral parietooccipital and right cerebellar infarcts better appreciated on prior exams. 2. No evidence of acute intracranial hemorrhage or new acute infarct. CTA neck: The common carotid, internal carotid and vertebral arteries are patent within the neck without significant stenosis. CTA head: 1. Unchanged appearance of the intracranial arterial circulation as compared to CTA head 05/27/2019. Redemonstrated stent extending from the mid basilar artery into the P2 left PCA. As before, there is decreased caliber of the left P2 segment just beyond  the stent, likely exaggerated by artifact. 2. The remaining branch vessels of the circle-of-Willis are unremarkable without proximal occlusion or high-grade proximal stenosis. Electronically Signed   By: Jackey Loge DO   On: 07/03/2019 14:21   CT ANGIO NECK W OR WO CONTRAST  Result Date: 06/26/2019 CLINICAL DATA:  Loss of vision after neuro interventional procedure. Placement neuroform atlas stent across a wide necked basilar tip aneurysm. EXAM: CT ANGIOGRAPHY HEAD AND NECK TECHNIQUE: Multidetector CT imaging of the head and neck was performed using the standard protocol during bolus administration of intravenous contrast. Multiplanar CT image reconstructions and MIPs were obtained to evaluate the vascular anatomy. Carotid stenosis measurements (when applicable) are obtained utilizing NASCET criteria, using the distal internal carotid diameter as the denominator. CONTRAST:  75mL OMNIPAQUE IOHEXOL 350 MG/ML SOLN COMPARISON:  None. FINDINGS: CTA NECK FINDINGS Aortic arch: A 3 vessel arch configuration is present. No significant atherosclerotic changes are present. No aneurysm or stenosis. Right carotid system: The right common carotid artery is within normal limits. Bifurcation is unremarkable. Cervical right ICA is normal. Left carotid system: The left common carotid artery is within normal limits. Bifurcation is unremarkable. The cervical left ICA is normal. Vertebral arteries: The vertebral arteries are codominant. There is no significant stenosis in either vertebral artery in the neck. Vertebral arteries originate the subclavian arteries without significant stenosis. Skeleton: Vertebral body heights alignment are maintained. Focal lytic or blastic lesions are present. Other neck: Soft tissues the neck are otherwise unremarkable. Upper chest: Lung apices are clear.  Thoracic inlet is normal. Review of the MIP images confirms the above findings CTA HEAD FINDINGS Anterior circulation: The internal carotid  arteries are within normal limits through the ICA termini bilaterally. 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 Both A2 segments fill from right. M1 segments are normal bilaterally. MCA bifurcations are normal. ACA and MCA branch vessels are within normal limits. No significant proximal stenosis or occlusion is present. Posterior circulation: The vertebral arteries are codominant. PICA origins are visualized and normal. Vertebrobasilar junction is normal. Stent is visualized from the distal basilar artery into the left P2 segment. There is decreased caliber of the vessel just distal to the stent. The appearance is likely exaggerated due to artifact. Distal branch vessels fill in the left PCA branches. Right PCA branches are within normal limits. Venous sinuses: The dural sinuses are patent. The straight sinus and deep cerebral veins are intact. Cortical veins are within normal limits. No vascular malformation is evident. Anatomic variants: None Review of the MIP images confirms the above findings IMPRESSION: 1. Arterial stent from the mid basilar artery into the left P2 segment. 2. Decreased caliber of the left P2 segment just beyond the stent is likely exaggerated by artifact. 3. No distal branch vessel occlusion to explain visual loss. 4. No occlusive disease involving the right PCA branch vessels. 5. No acute infarct is evident. 6. Normal CTA of the neck. These results were called by telephone at the time of interpretation on 06/26/2019 at 2:26 pm to provider [facility], who verbally acknowledged these results. Electronically Signed   By: Marin Roberts M.D.   On: 06/26/2019 14:40   MR BRAIN WO CONTRAST  Result Date: 07/03/2019 CLINICAL DATA:  Neuro deficits, subacute. Stroke follow-up. Difficulty expressing thoughts and difficulty with recall. 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 EXAM: MRI HEAD WITHOUT CONTRAST TECHNIQUE: Multiplanar, multiecho pulse sequences  of the brain and surrounding structures were obtained without intravenous contrast. COMPARISON:  Head CT/CTA  07/03/2019 and MRI 06/27/2019 FINDINGS: Brain: There is decreased diffusion weighted signal abnormality associated with the right cerebellar, bilateral occipital, and bilateral parietal infarcts on the prior MRI which are now subacute in appearance. A small infarct in the left cerebellum is new from the prior MRI although it also has an early subacute appearance. No definite residual intraventricular subarachnoid hemorrhage is identified. There is no extra-axial fluid collection or intracranial mass effect. The ventricles and sulci are normal. Vascular: Major intracranial vascular flow voids are preserved. Susceptibility artifact related to the stent extending from the basilar artery into the left PCA. Skull and upper cervical spine: Unremarkable bone marrow signal. Sinuses/Orbits: Unremarkable orbits. Paranasal sinuses and mastoid air cells are clear. Other: None. IMPRESSION: 1. Small early subacute left cerebellar infarct, new from the 06/27/2019 MRI. 2. Expected evolution of subacute bilateral parietal, occipital, and right cerebellar infarcts. 3. Resolved intraventricular and subarachnoid hemorrhage. Electronically Signed   By: Sebastian Ache M.D.   On: 07/03/2019 19:06   MR BRAIN WO CONTRAST  Addendum Date: 06/27/2019   ADDENDUM REPORT: 06/27/2019 06:27 ADDENDUM: Study discussed by telephone with Dr. Caryl Pina on 06/27/2019 at 0616 hours. Electronically Signed   By: Odessa Fleming M.D.   On: 06/27/2019 06:27   Result Date: 06/27/2019 CLINICAL DATA:  29 year old male with loss of vision after NIR procedure, stent placed across basilar tip aneurysm. EXAM: MRI HEAD WITHOUT CONTRAST TECHNIQUE: Multiplanar, multiecho pulse sequences of the brain and surrounding structures were obtained without intravenous contrast. COMPARISON:  CTA head and neck yesterday.  Brain MRI 02/22/2019. FINDINGS: Brain: There is  new high FLAIR signal and intermediate T1 signal in the basilar cisterns and posterior fossa suspicious for subarachnoid hemorrhage, including in the cisterna magna. Component of subdural blood is felt less likely. And there is associated intraventricular hemorrhage layering in the occipital horns, also the 4th ventricle. No superimposed ventriculomegaly. No midline shift or significant intracranial mass effect. Patchy areas of cortical and white matter restricted diffusion in both posterior parietal lobes, superior occipital lobes, the left occipital pole, left lateral occipital lobe, and a small area of the anterior and medial occipital lobe. Superimposed similar patchy restricted diffusion in the right cerebellum including the right tonsil. Cytotoxic edema with no parenchymal blood identified. No definite superimposed brainstem restricted diffusion. No thalamic restricted diffusion. No anterior circulation restricted diffusion identified. Stable gray and white matter signal in the anterior circulation. Vascular: Major intracranial vascular flow voids appear stable compared to August. Basilar through left PCA stent better demonstrated by CTA. Skull and upper cervical spine: No definite hemorrhage in the visible upper cervical spine. Bone marrow signal remains normal. Sinuses/Orbits: Intubated. Fluid in the pharynx. Paranasal sinuses remain well pneumatized. Negative orbits. Other: Mastoids remain clear. IMPRESSION: 1. Basilar cistern subarachnoid hemorrhage and small volume intraventricular hemorrhage appears new from the CTA yesterday. Difficult to exclude a component of subdural hematoma at the foramen magnum. 2. No ventriculomegaly or significant intracranial mass effect at this time. 3. 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 No brainstem or anterior circulation involvement identified. No intra-axial hemorrhage identified. 4. Major intracranial vascular  flow voids appear stable. Dr. Otelia Limes with Neurology was paged via AMION regarding the findings this exam on 06/27/2019 at 0553 hours. Electronically Signed: By: Odessa Fleming M.D. On: 06/27/2019 06:01   IR Transcath/Emboliz  Result Date: 07/01/2019 CLINICAL DATA:  Severe sudden headache a few weeks ago followed by persistent headaches variable in intensity. 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 EXAM: TRANSCATHETER THERAPY EMBOLIZATION COMPARISON:  Diagnostic catheter arteriogram of March 05, 2019. MEDICATIONS: Heparin 5,000 units IV; Ancef 2 g IV antibiotic was administered within 1 hour of the procedure. ANESTHESIA/SEDATION: General anesthesia. CONTRAST:  Isovue 300 approximately 70 cc. FLUOROSCOPY TIME:  Fluoroscopy Time: 48 minutes 25 seconds (1749 mGy). COMPLICATIONS: None immediate. TECHNIQUE: Informed written consent was obtained from the patient after a thorough discussion of the procedural risks, benefits and alternatives. All questions were addressed. Maximal Sterile Barrier Technique was utilized including caps, mask, sterile gowns, sterile gloves, sterile drape, hand hygiene and skin antiseptic. A timeout was performed prior to the initiation of the procedure. The right groin was prepped and draped in the usual sterile fashion. Thereafter using modified Seldinger technique, transfemoral access into the right common femoral artery was obtained without difficulty. Over a 0.035 inch guidewire, a 5 French Pinnacle sheath was inserted. Through this, and also over 0.035 inch guidewire, a 5 [place] JB 1 catheter was advanced to the aortic arch region and selectively positioned in the vertebral artery. FINDINGS: The right subclavian arteriogram demonstrates the origin of the right vertebral artery to be widely patent. The vessel is seen to ascend normally to the cranial skull base. Wide patency is seen of the right vertebrobasilar junction and the right posterior-inferior cerebellar artery.  The basilar artery, the posterior cerebral arteries, the superior cerebellar arteries and the anterior-inferior cerebellar arteries opacify into the capillary and venous phases. 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 Both posterior cerebral arteries are seen to emanate from the inferior aspect of the fundus of the aneurysm as well as the superior cerebellar arteries. Retrograde opacification of the left vertebrobasilar junction to the left posterior-inferior cerebellar artery is seen from the right vertebral artery arteriogram. ENDOVASCULAR STAGED EMBOLIZATION OF WIDE NECKED LARGE BASILAR APEX ANEURYSM. The diagnostic JB 1 catheter in the distal right subclavian artery was then exchanged for a 0.035 inch 300 cm Rosen exchange guidewire for an 80 cm 8 [place] Neuron Max sheath. The guidewire was removed. Good aspiration obtained from the hub of the Neuron Max sheath. The sheath was then retrieved proximally to engage the origin of the right vertebral artery. Over a 0.035 inch Roadrunner guidewire, a 5 French 115 cm Catalyst guide catheter was then advanced to the distal right vertebral artery. The guidewire was removed. Good aspiration obtained from the hub of the Catalyst guide catheter. A gentle control arteriogram performed through the Catalyst guide catheter demonstrated no change in the posterior fossa circulation. The 8 French Neuron Max sheath was then advanced to the mid third of the right vertebral artery. Using biplane roadmap technique and constant fluoroscopic guidance, in a coaxial manner and with constant heparinized saline infusion, a Phenom 17 microcatheter was then advanced over a 0.014 inch standard Synchro micro guidewire to the distal end of the 5 [place] Catalyst guide catheter. With the micro guidewire leading with a J-tip configuration, the combination was navigated to the distal basilar artery. Using a torque device, the left posterior cerebral artery  was then accessed with the micro guidewire and advanced to the P2 P3 region followed by the microcatheter. The guidewire was removed. Good aspiration obtained from the hub of the microcatheter. A gentle control arteriogram performed through the microcatheter demonstrated safe position of tip of the microcatheter which was then connected to continuous heparinized saline infusion. Measurements were then performed of the left posterior cerebral artery distal P1 segment and the distal  basilar artery. It was elected to place a 4.5 mm x 30 mm Neuroform Atlas stent across the left PCA and the distal basilar artery. The stent was then advanced to the distal end of the microcatheter. The proximal and the distal landing zones were then defined. With slight forward gentle traction with the right hand on the delivery micro guidewire, with the left hand the delivery microcatheter were retrieved unsheathing first the distal and then the proximal stent. The delivery microcatheter and the micro guidewire were then retrieved and removed without entanglement with the Neuroform stent. A control arteriogram performed through the 5 [place] Catalyst guide catheter in the right vertebral artery demonstrated excellent apposition proximally and distally of the stent across the neck of the aneurysm. Control arteriograms were performed at 15, 30 and 35 minutes post stent deployment. These continued to demonstrate excellent apposition and flow without evidence of intraluminal filling defects, or of change in caliber of the posterior cerebral arteries, the superior cerebral arteries or the vertebral artery. Also no evidence of extravasation or mass-effect or midline shift was seen. She notes evidence of sudden surges in blood pressure were noted. The 5 [place] Catalyst guide catheter was retrieved and removed. A final control arteriogram performed through the 8 French Neuroform sheath in the origin of the right vertebral artery demonstrated  excellent flow through the intracranial and extracranial vertebral artery with no change in the posterior cerebral arteries or the superior cerebellar arteries. The stent was noted to be stable distally and proximally. During the procedure, the patient's ACT was maintained in the region of approximately 200 seconds. The 8 French Pinnacle sheath was then removed with the successful placement of an 8 French Angio-Seal closure device. The right groin appeared soft without evidence of bleeding or hematoma. Distal pulses remained Dopplerable in the posterior tibial, and the dorsalis pedis arteries bilaterally unchanged. The patient's general anesthesia was then reversed, and the patient was extubated without event. The patient was given 5 mg of protamine sulfate to partially reverse the effects of the heparin. Upon recovery, the patient denied any headaches, nausea, vomiting or visual difficulties. She responded to commands appropriately. She moved all extremities equally. She was then transported to the recovery area intact. The patient was started on IV heparin with blood pressure in the region of 120-140 mmHg. IMPRESSION: Status post endovascular staged embolization of large wide neck basilar apex aneurysm using the Neuroform Atlas stent as described. PLAN: Follow-up in the clinic in 2 weeks. Electronically Signed   By: Julieanne Cotton M.D.   On: 06/27/2019 17:04   IR Angiogram Follow Up Study  Result Date: 06/26/2019 CLINICAL DATA:  Severe sudden headache a few weeks ago followed by persistent headaches variable in intensity. 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  EXAM: TRANSCATHETER THERAPY EMBOLIZATION  COMPARISON:  Diagnostic catheter arteriogram of March 05, 2019.  MEDICATIONS: Heparin 5,000 units IV; Ancef 2 g IV antibiotic was administered within 1 hour of the procedure.  ANESTHESIA/SEDATION: General anesthesia.  CONTRAST:  Isovue 300 approximately 70 cc.  FLUOROSCOPY TIME:   Fluoroscopy Time: 48 minutes 25 seconds (1749 mGy).  COMPLICATIONS: None immediate.  TECHNIQUE: Informed written consent was obtained from the patient after a thorough discussion of the procedural risks, benefits and alternatives. All questions were addressed. Maximal Sterile Barrier Technique was utilized including caps, mask, sterile gowns, sterile gloves, sterile drape, hand hygiene and skin antiseptic. A timeout was performed prior to the initiation of the procedure.  The right groin was  prepped and draped in the usual sterile fashion. Thereafter using modified Seldinger technique, transfemoral access into the right common femoral artery was obtained without difficulty. Over a 0.035 inch guidewire, a 5 French Pinnacle sheath was inserted. Through this, and also over 0.035 inch guidewire, a 5 [place] JB 1 catheter was advanced to the aortic arch region and selectively positioned in the vertebral artery.  FINDINGS: The right subclavian arteriogram demonstrates the origin of the right vertebral artery to be widely patent.  The vessel is seen to ascend normally to the cranial skull base. Wide patency is seen of the right vertebrobasilar junction and the right posterior-inferior cerebellar artery.  The basilar artery, the posterior cerebral arteries, the superior cerebellar arteries and the anterior-inferior cerebellar arteries opacify into the capillary and venous phases.  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 Both posterior cerebral arteries are seen to emanate from the inferior aspect of the fundus of the aneurysm as well as the superior cerebellar arteries. Retrograde opacification of the left vertebrobasilar junction to the left posterior-inferior cerebellar artery is seen from the right vertebral artery arteriogram.  ENDOVASCULAR STAGED EMBOLIZATION OF WIDE NECKED LARGE BASILAR APEX ANEURYSM.  The diagnostic JB 1 catheter in the distal right subclavian artery  was then exchanged for a 0.035 inch 300 cm Rosen exchange guidewire for an 80 cm 8 [place] Neuron Max sheath. The guidewire was removed. Good aspiration obtained from the hub of the Neuron Max sheath. The sheath was then retrieved proximally to engage the origin of the right vertebral artery.  Over a 0.035 inch Roadrunner guidewire, a 5 French 115 cm Catalyst guide catheter was then advanced to the distal right vertebral artery.  The guidewire was removed. Good aspiration obtained from the hub of the Catalyst guide catheter. A gentle control arteriogram performed through the Catalyst guide catheter demonstrated no change in the posterior fossa circulation.  The 8 French Neuron Max sheath was then advanced to the mid third of the right vertebral artery.  Using biplane roadmap technique and constant fluoroscopic guidance, in a coaxial manner and with constant heparinized saline infusion, a Phenom 17 microcatheter was then advanced over a 0.014 inch standard Synchro micro guidewire to the distal end of the 5 [place] Catalyst guide catheter.  With the micro guidewire leading with a J-tip configuration, the combination was navigated to the distal basilar artery.  Using a torque device, the left posterior cerebral artery was then accessed with the micro guidewire and advanced to the P2 P3 region followed by the microcatheter. The guidewire was removed. Good aspiration obtained from the hub of the microcatheter. A gentle control arteriogram performed through the microcatheter demonstrated safe position of tip of the microcatheter which was then connected to continuous heparinized saline infusion. Measurements were then performed of the left posterior cerebral artery distal P1 segment and the distal basilar artery.  It was elected to place a 4.5 mm x 30 mm Neuroform Atlas stent across the left PCA and the distal basilar artery.  The stent was then advanced to the distal end of the microcatheter.  The proximal and the  distal landing zones were then defined.  With slight forward gentle traction with the right hand on the delivery micro guidewire, with the left hand the delivery microcatheter were retrieved unsheathing first the distal and then the proximal stent.  The delivery microcatheter and the micro guidewire were then retrieved and removed without entanglement with the Neuroform stent.  A control arteriogram performed  through the 5 [place] Catalyst guide catheter in the right vertebral artery demonstrated excellent apposition proximally and distally of the stent across the neck of the aneurysm.  Control arteriograms were performed at 15, 30 and 35 minutes post stent deployment. These continued to demonstrate excellent apposition and flow without evidence of intraluminal filling defects, or of change in caliber of the posterior cerebral arteries, the superior cerebral arteries or the vertebral artery.  Also no evidence of extravasation or mass-effect or midline shift was seen.  She notes evidence of sudden surges in blood pressure were noted.  The 5 [place] Catalyst guide catheter was retrieved and removed. A final control arteriogram performed through the 8 French Neuroform sheath in the origin of the right vertebral artery demonstrated excellent flow through the intracranial and extracranial vertebral artery with no change in the posterior cerebral arteries or the superior cerebellar arteries. The stent was noted to be stable distally and proximally.  During the procedure, the patient's ACT was maintained in the region of approximately 200 seconds.  The 8 French Pinnacle sheath was then removed with the successful placement of an 8 French Angio-Seal closure device.  The right groin appeared soft without evidence of bleeding or hematoma. Distal pulses remained Dopplerable in the posterior tibial, and the dorsalis pedis arteries bilaterally unchanged.  The patient's general anesthesia was then reversed, and the  patient was extubated without event.  The patient was given 5 mg of protamine sulfate to partially reverse the effects of the heparin. Upon recovery, the patient denied any headaches, nausea, vomiting or visual difficulties. She responded to commands appropriately. She moved all extremities equally.  She was then transported to the recovery area intact. The patient was started on IV heparin with blood pressure in the region of 120-140 mmHg.  IMPRESSION: Status post endovascular staged embolization of large wide neck basilar apex aneurysm using the Neuroform Atlas stent as described.  PLAN: Follow-up in the clinic in 2 weeks.   Electronically Signed   By: Julieanne Cotton M.D.   On: 06/27/2019 17:04   DG Chest Port 1 View  Result Date: 06/27/2019 CLINICAL DATA:  Endotracheal tube.  Recent aneurysm embolization. EXAM: PORTABLE CHEST 1 VIEW COMPARISON:  None. FINDINGS: Endotracheal tube has tip 7.8 cm above the carina. Lungs are adequately inflated without focal airspace consolidation or effusion. Cardiomediastinal silhouette is within normal. Remaining bones and soft tissues are normal. IMPRESSION: No active disease. Endotracheal tube with tip 7.8 cm above the carina. Electronically Signed   By: Elberta Fortis M.D.   On: 06/27/2019 07:10   ECHOCARDIOGRAM COMPLETE  Result Date: 06/28/2019   ECHOCARDIOGRAM REPORT   Patient Name:   [facility] Date of Exam: 06/28/2019 Medical Rec #:  [id number]       Height:       71.0 in Accession #:    [id number]      Weight:       163.0 lb Date of Birth:  1991/03/03      BSA:          1.93 m Patient Age:    28 years        BP:           108/81 mmHg Patient Gender: M               HR:           75 bpm. Exam Location:  Inpatient Procedure: 2D Echo Indications:    Stroke  History:  Patient has no prior history of Echocardiogram examinations.  Sonographer:    Thurman Coyer RDCS (AE) Referring Phys: [id number] SHARON L BIBY IMPRESSIONS  1. Left ventricular  ejection fraction, by visual estimation, is 60 to 65%. The left ventricle has normal function. There is no left ventricular hypertrophy.  2. The left ventricle has no regional wall motion abnormalities.  3. Global right ventricle has normal systolic function.The right ventricular size is normal. No increase in right ventricular wall thickness.  4. Left atrial size was normal.  5. Right atrial size was normal.  6. The mitral valve is normal in structure. Mild mitral valve regurgitation.  7. The tricuspid valve is normal in structure.  8. The aortic valve is tricuspid. Aortic valve regurgitation is not visualized. No evidence of aortic valve sclerosis or stenosis.  9. The pulmonic valve was normal in structure. Pulmonic valve regurgitation is not visualized. 10. The inferior vena cava is normal in size with greater than 50% respiratory variability, suggesting right atrial pressure of 3 mmHg. FINDINGS  Left Ventricle: Left ventricular ejection fraction, by visual estimation, is 60 to 65%. The left ventricle has normal function. The left ventricle has no regional wall motion abnormalities. The left ventricular internal cavity size was the left ventricle is normal in size. There is no left ventricular hypertrophy. Left ventricular diastolic parameters were normal. Right Ventricle: The right ventricular size is normal. No increase in right ventricular wall thickness. Global RV systolic function is has normal systolic function. Left Atrium: Left atrial size was normal in size. Right Atrium: Right atrial size was normal in size Pericardium: There is no evidence of pericardial effusion. Mitral Valve: The mitral valve is normal in structure. Mild mitral valve regurgitation. Tricuspid Valve: The tricuspid valve is normal in structure. Tricuspid valve regurgitation is trivial. Aortic Valve: The aortic valve is tricuspid. Aortic valve regurgitation is not visualized. The aortic valve is structurally normal, with no evidence of  sclerosis or stenosis. Pulmonic Valve: The pulmonic valve was normal in structure. Pulmonic valve regurgitation is not visualized. Pulmonic regurgitation is not visualized. Aorta: The aortic root is normal in size and structure. Venous: The inferior vena cava is normal in size with greater than 50% respiratory variability, suggesting right atrial pressure of 3 mmHg. IAS/Shunts: No atrial level shunt detected by color flow Doppler.  LEFT VENTRICLE PLAX 2D LVIDd:         5.40 cm  Diastology LVIDs:         3.50 cm  LV e' lateral:   11.60 cm/s LV PW:         0.80 cm  LV E/e' lateral: 7.9 LV IVS:        0.70 cm  LV e' medial:    11.70 cm/s LVOT diam:     2.10 cm  LV E/e' medial:  7.9 LV SV:         90 ml LV SV Index:   47.00 LVOT Area:     3.46 cm  RIGHT VENTRICLE RV S prime:     17.30 cm/s TAPSE (M-mode): 2.7 cm LEFT ATRIUM             Index       RIGHT ATRIUM           Index LA diam:        3.70 cm 1.91 cm/m  RA Area:     15.20 cm LA Vol (A2C):   53.5 ml 27.68 ml/m RA Volume:   38.50 ml  19.92 ml/m LA Vol (A4C):   44.8 ml 23.18 ml/m LA Biplane Vol: 49.0 ml 25.35 ml/m  AORTIC VALVE LVOT Vmax:   110.00 cm/s LVOT Vmean:  66.200 cm/s LVOT VTI:    0.192 m  AORTA Ao Root diam: 3.10 cm MITRAL VALVE MV Area (PHT): 3.23 cm             SHUNTS MV PHT:        68.15 msec           Systemic VTI:  0.19 m MV Decel Time: 235 msec             Systemic Diam: 2.10 cm MV E velocity: 92.00 cm/s 103 cm/s MV A velocity: 64.90 cm/s 70.3 cm/s MV E/A ratio:  1.42       1.5  Prentice Docker MD Electronically signed by Prentice Docker MD Signature Date/Time: 06/28/2019/4:11:29 PM    Final    IR ANGIO VERTEBRAL SEL VERTEBRAL UNI R MOD SED  Result Date: 06/26/2019 CLINICAL DATA:  Severe sudden headache a few weeks ago followed by persistent headaches variable in intensity. 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  EXAM: TRANSCATHETER THERAPY EMBOLIZATION  COMPARISON:  Diagnostic catheter arteriogram of March 05, 2019.  MEDICATIONS: Heparin 5,000 units IV; Ancef 2 g IV antibiotic was administered within 1 hour of the procedure.  ANESTHESIA/SEDATION: General anesthesia.  CONTRAST:  Isovue 300 approximately 70 cc.  FLUOROSCOPY TIME:  Fluoroscopy Time: 48 minutes 25 seconds (1749 mGy).  COMPLICATIONS: None immediate.  TECHNIQUE: Informed written consent was obtained from the patient after a thorough discussion of the procedural risks, benefits and alternatives. All questions were addressed. Maximal Sterile Barrier Technique was utilized including caps, mask, sterile gowns, sterile gloves, sterile drape, hand hygiene and skin antiseptic. A timeout was performed prior to the initiation of the procedure.  The right groin was prepped and draped in the usual sterile fashion. Thereafter using modified Seldinger technique, transfemoral access into the right common femoral artery was obtained without difficulty. Over a 0.035 inch guidewire, a 5 French Pinnacle sheath was inserted. Through this, and also over 0.035 inch guidewire, a 5 [place] JB 1 catheter was advanced to the aortic arch region and selectively positioned in the vertebral artery.  FINDINGS: The right subclavian arteriogram demonstrates the origin of the right vertebral artery to be widely patent.  The vessel is seen to ascend normally to the cranial skull base. Wide patency is seen of the right vertebrobasilar junction and the right posterior-inferior cerebellar artery.  The basilar artery, the posterior cerebral arteries, the superior cerebellar arteries and the anterior-inferior cerebellar arteries opacify into the capillary and venous phases.  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 Both posterior cerebral arteries are seen to emanate from the inferior aspect of the fundus of the aneurysm as well as the superior cerebellar arteries. Retrograde opacification of the left vertebrobasilar junction to the left  posterior-inferior cerebellar artery is seen from the right vertebral artery arteriogram.  ENDOVASCULAR STAGED EMBOLIZATION OF WIDE NECKED LARGE BASILAR APEX ANEURYSM.  The diagnostic JB 1 catheter in the distal right subclavian artery was then exchanged for a 0.035 inch 300 cm Rosen exchange guidewire for an 80 cm 8 [place] Neuron Max sheath. The guidewire was removed. Good aspiration obtained from the hub of the Neuron Max sheath. The sheath was then retrieved proximally to engage the origin of the right vertebral artery.  Over a 0.035 inch Roadrunner guidewire, a  5 French 115 cm Catalyst guide catheter was then advanced to the distal right vertebral artery.  The guidewire was removed. Good aspiration obtained from the hub of the Catalyst guide catheter. A gentle control arteriogram performed through the Catalyst guide catheter demonstrated no change in the posterior fossa circulation.  The 8 French Neuron Max sheath was then advanced to the mid third of the right vertebral artery.  Using biplane roadmap technique and constant fluoroscopic guidance, in a coaxial manner and with constant heparinized saline infusion, a Phenom 17 microcatheter was then advanced over a 0.014 inch standard Synchro micro guidewire to the distal end of the 5 [place] Catalyst guide catheter.  With the micro guidewire leading with a J-tip configuration, the combination was navigated to the distal basilar artery.  Using a torque device, the left posterior cerebral artery was then accessed with the micro guidewire and advanced to the P2 P3 region followed by the microcatheter. The guidewire was removed. Good aspiration obtained from the hub of the microcatheter. A gentle control arteriogram performed through the microcatheter demonstrated safe position of tip of the microcatheter which was then connected to continuous heparinized saline infusion. Measurements were then performed of the left posterior cerebral artery distal P1 segment  and the distal basilar artery.  It was elected to place a 4.5 mm x 30 mm Neuroform Atlas stent across the left PCA and the distal basilar artery.  The stent was then advanced to the distal end of the microcatheter.  The proximal and the distal landing zones were then defined.  With slight forward gentle traction with the right hand on the delivery micro guidewire, with the left hand the delivery microcatheter were retrieved unsheathing first the distal and then the proximal stent.  The delivery microcatheter and the micro guidewire were then retrieved and removed without entanglement with the Neuroform stent.  A control arteriogram performed through the 5 [place] Catalyst guide catheter in the right vertebral artery demonstrated excellent apposition proximally and distally of the stent across the neck of the aneurysm.  Control arteriograms were performed at 15, 30 and 35 minutes post stent deployment. These continued to demonstrate excellent apposition and flow without evidence of intraluminal filling defects, or of change in caliber of the posterior cerebral arteries, the superior cerebral arteries or the vertebral artery.  Also no evidence of extravasation or mass-effect or midline shift was seen.  She notes evidence of sudden surges in blood pressure were noted.  The 5 [place] Catalyst guide catheter was retrieved and removed. A final control arteriogram performed through the 8 French Neuroform sheath in the origin of the right vertebral artery demonstrated excellent flow through the intracranial and extracranial vertebral artery with no change in the posterior cerebral arteries or the superior cerebellar arteries. The stent was noted to be stable distally and proximally.  During the procedure, the patient's ACT was maintained in the region of approximately 200 seconds.  The 8 French Pinnacle sheath was then removed with the successful placement of an 8 French Angio-Seal closure device.  The right  groin appeared soft without evidence of bleeding or hematoma. Distal pulses remained Dopplerable in the posterior tibial, and the dorsalis pedis arteries bilaterally unchanged.  The patient's general anesthesia was then reversed, and the patient was extubated without event.  The patient was given 5 mg of protamine sulfate to partially reverse the effects of the heparin. Upon recovery, the patient denied any headaches, nausea, vomiting or visual difficulties. She responded to commands appropriately. She moved  all extremities equally.  She was then transported to the recovery area intact. The patient was started on IV heparin with blood pressure in the region of 120-140 mmHg.  IMPRESSION: Status post endovascular staged embolization of large wide neck basilar apex aneurysm using the Neuroform Atlas stent as described.  PLAN: Follow-up in the clinic in 2 weeks.   Electronically Signed   By: Julieanne Cotton M.D.   On: 06/27/2019 17:04   IR NEURO EACH ADD'L AFTER BASIC UNI RIGHT (MS)  Result Date: 06/26/2019 CLINICAL DATA:  Severe sudden headache a few weeks ago followed by persistent headaches variable in intensity. 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  EXAM: TRANSCATHETER THERAPY EMBOLIZATION  COMPARISON:  Diagnostic catheter arteriogram of March 05, 2019.  MEDICATIONS: Heparin 5,000 units IV; Ancef 2 g IV antibiotic was administered within 1 hour of the procedure.  ANESTHESIA/SEDATION: General anesthesia.  CONTRAST:  Isovue 300 approximately 70 cc.  FLUOROSCOPY TIME:  Fluoroscopy Time: 48 minutes 25 seconds (1749 mGy).  COMPLICATIONS: None immediate.  TECHNIQUE: Informed written consent was obtained from the patient after a thorough discussion of the procedural risks, benefits and alternatives. All questions were addressed. Maximal Sterile Barrier Technique was utilized including caps, mask, sterile gowns, sterile gloves, sterile drape, hand hygiene and skin antiseptic.  A timeout was performed prior to the initiation of the procedure.  The right groin was prepped and draped in the usual sterile fashion. Thereafter using modified Seldinger technique, transfemoral access into the right common femoral artery was obtained without difficulty. Over a 0.035 inch guidewire, a 5 French Pinnacle sheath was inserted. Through this, and also over 0.035 inch guidewire, a 5 [place] JB 1 catheter was advanced to the aortic arch region and selectively positioned in the vertebral artery.  FINDINGS: The right subclavian arteriogram demonstrates the origin of the right vertebral artery to be widely patent.  The vessel is seen to ascend normally to the cranial skull base. Wide patency is seen of the right vertebrobasilar junction and the right posterior-inferior cerebellar artery.  The basilar artery, the posterior cerebral arteries, the superior cerebellar arteries and the anterior-inferior cerebellar arteries opacify into the capillary and venous phases.  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 Both posterior cerebral arteries are seen to emanate from the inferior aspect of the fundus of the aneurysm as well as the superior cerebellar arteries. Retrograde opacification of the left vertebrobasilar junction to the left posterior-inferior cerebellar artery is seen from the right vertebral artery arteriogram.  ENDOVASCULAR STAGED EMBOLIZATION OF WIDE NECKED LARGE BASILAR APEX ANEURYSM.  The diagnostic JB 1 catheter in the distal right subclavian artery was then exchanged for a 0.035 inch 300 cm Rosen exchange guidewire for an 80 cm 8 [place] Neuron Max sheath. The guidewire was removed. Good aspiration obtained from the hub of the Neuron Max sheath. The sheath was then retrieved proximally to engage the origin of the right vertebral artery.  Over a 0.035 inch Roadrunner guidewire, a 5 French 115 cm Catalyst guide catheter was then advanced to the distal  right vertebral artery.  The guidewire was removed. Good aspiration obtained from the hub of the Catalyst guide catheter. A gentle control arteriogram performed through the Catalyst guide catheter demonstrated no change in the posterior fossa circulation.  The 8 French Neuron Max sheath was then advanced to the mid third of the right vertebral artery.  Using biplane roadmap technique and constant fluoroscopic guidance, in a coaxial manner and  with constant heparinized saline infusion, a Phenom 17 microcatheter was then advanced over a 0.014 inch standard Synchro micro guidewire to the distal end of the 5 [place] Catalyst guide catheter.  With the micro guidewire leading with a J-tip configuration, the combination was navigated to the distal basilar artery.  Using a torque device, the left posterior cerebral artery was then accessed with the micro guidewire and advanced to the P2 P3 region followed by the microcatheter. The guidewire was removed. Good aspiration obtained from the hub of the microcatheter. A gentle control arteriogram performed through the microcatheter demonstrated safe position of tip of the microcatheter which was then connected to continuous heparinized saline infusion. Measurements were then performed of the left posterior cerebral artery distal P1 segment and the distal basilar artery.  It was elected to place a 4.5 mm x 30 mm Neuroform Atlas stent across the left PCA and the distal basilar artery.  The stent was then advanced to the distal end of the microcatheter.  The proximal and the distal landing zones were then defined.  With slight forward gentle traction with the right hand on the delivery micro guidewire, with the left hand the delivery microcatheter were retrieved unsheathing first the distal and then the proximal stent.  The delivery microcatheter and the micro guidewire were then retrieved and removed without entanglement with the Neuroform stent.  A control arteriogram  performed through the 5 [place] Catalyst guide catheter in the right vertebral artery demonstrated excellent apposition proximally and distally of the stent across the neck of the aneurysm.  Control arteriograms were performed at 15, 30 and 35 minutes post stent deployment. These continued to demonstrate excellent apposition and flow without evidence of intraluminal filling defects, or of change in caliber of the posterior cerebral arteries, the superior cerebral arteries or the vertebral artery.  Also no evidence of extravasation or mass-effect or midline shift was seen.  She notes evidence of sudden surges in blood pressure were noted.  The 5 [place] Catalyst guide catheter was retrieved and removed. A final control arteriogram performed through the 8 French Neuroform sheath in the origin of the right vertebral artery demonstrated excellent flow through the intracranial and extracranial vertebral artery with no change in the posterior cerebral arteries or the superior cerebellar arteries. The stent was noted to be stable distally and proximally.  During the procedure, the patient's ACT was maintained in the region of approximately 200 seconds.  The 8 French Pinnacle sheath was then removed with the successful placement of an 8 French Angio-Seal closure device.  The right groin appeared soft without evidence of bleeding or hematoma. Distal pulses remained Dopplerable in the posterior tibial, and the dorsalis pedis arteries bilaterally unchanged.  The patient's general anesthesia was then reversed, and the patient was extubated without event.  The patient was given 5 mg of protamine sulfate to partially reverse the effects of the heparin. Upon recovery, the patient denied any headaches, nausea, vomiting or visual difficulties. She responded to commands appropriately. She moved all extremities equally.  She was then transported to the recovery area intact. The patient was started on IV heparin with blood  pressure in the region of 120-140 mmHg.  IMPRESSION: Status post endovascular staged embolization of large wide neck basilar apex aneurysm using the Neuroform Atlas stent as described.  PLAN: Follow-up in the clinic in 2 weeks.   Electronically Signed   By: Julieanne Cotton M.D.   On: 06/27/2019 17:04    Labs:  CBC: Recent Labs  06/30/19 0655 07/02/19 1547 07/03/19 1148 07/04/19 0250  WBC 5.1 4.4 3.5* 3.8*  HGB 12.1* 12.4* 12.3* 12.0*  HCT 36.2* 38.4* 37.8* 36.2*  PLT 95* 125* 127* 133*    COAGS: Recent Labs    03/05/19 1110 06/26/19 0635 07/02/19 1547  INR 1.2 1.1 1.0  APTT  --   --  29    BMP: Recent Labs    06/29/19 0548 06/30/19 0655 07/02/19 1547 07/03/19 1148 07/04/19 0250  NA 140 138 139 139  --   K 3.3* 3.8 3.6 4.1  --   CL 106 110 104 105  --   CO2 21* 23 28 25   --   GLUCOSE 81 119* 86 89  --   BUN 6 5* <5* 8  --   CALCIUM 7.8* 8.3* 8.4* 8.6*  --   CREATININE 0.86 0.77 0.82 0.68 0.63  GFRNONAA >60 >60 >60 >60 >60  GFRAA >60 >60 >60 >60 >60    LIVER FUNCTION TESTS: Recent Labs    06/29/19 0548 07/02/19 1547 07/03/19 1148  BILITOT 1.1 0.4 0.8  AST 30 45* 42*  ALT 21 49* 48*  ALKPHOS 38 55 57  PROT 7.2 8.5* 8.4*  ALBUMIN 2.9* 3.4* 3.2*     Assessment and Plan:  Basilar apex aneurysm s/p staged embolization using a neuroform ATLAS stent 06/26/2019 by Dr. Corliss Skains. Dr. Corliss Skains was present for consultation. Discussed patient's progress post-procedure. Patient states that his memory and vision are improving at this time. States that he goes to PT for this. Discussed stent. Explained the best management option for his stent at this time is with routine imaging scans to monitor for changes.  Discussed depression/anxiety. Patient states that he was recently started on an antidepressant. Advised patient to follow-up with PCP regularly regarding depression/anxiety management.   Discussed illicit substance (cocaine) cessation- strongly  advised patient to stop all illicit substance use at this time.  Discussed new diagnosis of HIV. Patient states that he found out he had HIV "a few months ago". States he found out from the health department. States he has not had any follow-up regarding HIV management. States that he has an appointment tomorrow with "some doctor" (?PCP). Strongly advised patient see PCP and ID regarding management of HIV.  Plan for follow-up with MRI/MRA brain/head (without contrast) 6 months from procedure 06/26/2019. Informed patient that our schedulers will call him to set up this imaging scan. Instructed patient to continue taking Plavix 75 mg once daily and Aspirin 81 mg once daily.  All questions answered and concerns addressed. Patient conveys understanding and agrees with plan.  Thank you for this interesting consult.  I greatly enjoyed meeting Thaddeaus Chirico and look forward to participating in their care.  A copy of this report was sent to the requesting provider on this date.  Electronically Signed: Elwin Mocha, PA-C 07/15/2019, 9:33 AM   I spent a total of 40 Minutes in face to face in clinical consultation, greater than 50% of which was counseling/coordinating care for basilar apex aneurysm s/p staged embolization.

## 2019-07-18 ENCOUNTER — Ambulatory Visit: Payer: Managed Care, Other (non HMO) | Admitting: Occupational Therapy

## 2019-07-18 ENCOUNTER — Other Ambulatory Visit: Payer: Self-pay

## 2019-07-18 ENCOUNTER — Encounter: Payer: Self-pay | Admitting: Occupational Therapy

## 2019-07-18 VITALS — BP 110/75

## 2019-07-18 DIAGNOSIS — R41842 Visuospatial deficit: Secondary | ICD-10-CM

## 2019-07-18 DIAGNOSIS — R41844 Frontal lobe and executive function deficit: Secondary | ICD-10-CM | POA: Diagnosis not present

## 2019-07-18 DIAGNOSIS — R41840 Attention and concentration deficit: Secondary | ICD-10-CM

## 2019-07-18 NOTE — Therapy (Signed)
[facility] [address] [place], [place], [address] Phone: [phone]   Fax:  [phone]  Occupational Therapy Treatment  Patient Details  Name: Jacob Holder MRN: [id number] Date of Birth: [birth year] Referring Provider (OT): Dr. Marlin Canary   Encounter Date: 07/18/2019  OT End of Session - 07/18/19 1051    Visit Number  3    Number of Visits  25    Date for OT Re-Evaluation  10/08/19    Authorization Type  [employer]    Authorization - Visit Number  3    Authorization - Number of Visits  60    OT Start Time  1018    OT Stop Time  1058    OT Time Calculation (min)  40 min       Past Medical History:  Diagnosis Date  . Aneurysm (HCC)   . Anxiety   . Cocaine abuse (HCC)   . Depression   . GERD (gastroesophageal reflux disease)   . Headache   . Stroke (HCC)   . Wears contact lenses     Past Surgical History:  Procedure Laterality Date  . IR ANGIO INTRA EXTRACRAN SEL COM CAROTID INNOMINATE BILAT MOD SED  03/05/2019  . IR ANGIO VERTEBRAL SEL VERTEBRAL BILAT MOD SED  03/05/2019  . IR ANGIO VERTEBRAL SEL VERTEBRAL UNI R MOD SED  06/26/2019  . IR ANGIOGRAM FOLLOW UP STUDY  06/26/2019  . IR NEURO EACH ADD'L AFTER BASIC UNI RIGHT (MS)  06/26/2019  . IR TRANSCATH/EMBOLIZ  06/26/2019  . RADIOLOGY WITH ANESTHESIA N/A 06/26/2019   Procedure: EMBOLIZATION;  Surgeon: Julieanne Cotton, MD;  Location: [facility];  Service: Radiology;  Laterality: N/A;  . RECTAL SURGERY    . TOOTH EXTRACTION      Vitals:   07/18/19 1024  BP: 110/75    Subjective Assessment - 07/18/19 1138    Subjective   Pt reports he is still having word finding difficulties    Pertinent History  Pt is a 29 y.o. 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 Following coiling, pt developed acute vision loss and confusion. MRI at the time showing scattered acute infarcts in the bilateral parietal  lobes, left occipital lobe and right cerebellum in addition to [facility] and IVH. Pt signed out AMA. Pt readmitted 07/03/2019 with memory issues and headache. MRI showing new infarct invovling the left cerebellum    Patient Stated Goals  to improve vision and thinking abilities    Currently in Pain?  No/denies           Treatment: Pt reports he had a pain in his chest when  He woke up. No pain currently, pt was instructed to discuss with his MD when he calls to request ST order. Environmental scanning 2/13 missed on first pass, when locating items in sequential order for cognitive component. Tabletop scanning to locate the number that repeats x 4 , 100% accuracy. Typing test in prep for work 16 wpm, 97% accuracy. Followed by typing activities for speed, scanning and multi tasking, min difficulty/ v.c                  OT Short Term Goals - 07/10/19 1520      OT SHORT TERM GOAL #1   Title  Pt will verbalize understanding of compensatory strategies for visual perceptual deficits.-08/24/19    Time  6    Period  Weeks    Status  New    Target Date  08/24/19      OT SHORT TERM GOAL #2   Title  Pt will verbalize understanding of compensatory strategies for short term memory deficits.    Time  6    Period  Weeks    Status  New      OT SHORT TERM GOAL #3   Title  Pt will perform environmental scanning with 80% or better accuracy.    Time  6    Period  Weeks    Status  New      OT SHORT TERM GOAL #4   Title  Pt will perfom basic cooking modified indpendently demonstrating good safety awareness.    Time  6    Period  Weeks    Status  New      OT SHORT TERM GOAL #5   Title  Pt will perform tabletop scanning activities with 95% or better accuracy.    Time  6    Period  Weeks    Status  New        OT Long Term Goals - 07/10/19 1010      OT LONG TERM GOAL #1   Title  Pt will perform mod complex environmantal scanning tasks with 90% ofr better accuracy.    Time  12     Period  Weeks    Status  New      OT LONG TERM GOAL #2   Title  Pt will perfrom simulated work tasks with 90% or better accuracy.    Time  12    Period  Weeks    Status  New      OT LONG TERM GOAL #3   Title  Pt will perform a physical and cognitive task simultaneously in prep for work activities/ driving.    Time  12    Period  Weeks    Status  New      OT LONG TERM GOAL #4   Title  Pt will perform mod complex cooking modified indpendently demonstrating good safety awareness.    Time  12    Period  Weeks    Status  New            Plan - 07/18/19 1139    Clinical Impression Statement  Pt is progressing towards goals with improving visual perceptual skills.    OT Occupational Profile and History  Detailed Assessment- Review of Records and additional review of physical, cognitive, psychosocial history related to current functional performance    Occupational performance deficits (Please refer to evaluation for details):  ADL's;IADL's;Work;Play;Leisure;Social Participation    Body Structure / Function / Physical Skills  ADL;Vision;Decreased knowledge of precautions    Cognitive Skills  Attention;Memory;Orientation;Problem Solve;Perception;Safety Awareness;Sequencing;Thought;Understand    Rehab Potential  Good    Clinical Decision Making  Limited treatment options, no task modification necessary    Comorbidities Affecting Occupational Performance:  May have comorbidities impacting occupational performance    Modification or Assistance to Complete Evaluation   No modification of tasks or assist necessary to complete eval    OT Frequency  2x / week   plus eval, may d/c after 8 weeks dep on progress.   OT Duration  12 weeks    OT Treatment/Interventions  Self-care/ADL training;Visual/perceptual remediation/compensation;Patient/family education;Therapeutic activities;Therapeutic exercise;Cognitive remediation/compensation    Plan  continue visual perceptual tasks with a cogntive  component, simulate work activities.    Consulted and Agree with Plan of Care  Patient  Patient will benefit from skilled therapeutic intervention in order to improve the following deficits and impairments:   Body Structure / Function / Physical Skills: ADL, Vision, Decreased knowledge of precautions Cognitive Skills: Attention, Memory, Orientation, Problem Solve, Perception, Safety Awareness, Sequencing, Thought, Understand     Visit Diagnosis: Frontal lobe and executive function deficit  Attention and concentration deficit  Visuospatial deficit    Problem List Patient Active Problem List   Diagnosis Date Noted  . HIV disease ([place]) 07/09/2019  . Normochromic normocytic anemia 07/04/2019  . Stroke ([place]) 07/04/2019  . Drug use 07/04/2019  . Cocaine abuse ([place])   . Acute CVA (cerebrovascular accident) ([place]) 07/03/2019  . Hypokalemia   . History of ETT   . SAH (subarachnoid hemorrhage) ([place]) 06/27/2019  . IVH (intraventricular hemorrhage) ([place]) 06/27/2019  . Cerebrovascular accident (CVA) due to embolism of posterior cerebral artery with infarctions of both occipital lobes ([place]) 06/27/2019  . 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 Aphasia 06/27/2019  . 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/26/2019  . Acute respiratory failure with hypoxemia ([place])   . Acute metabolic encephalopathy   . Essential hypertension     Anup Brigham 07/18/2019, 11:46 AM  [facility] [address] [place], [place], [address] Phone: [phone]   Fax:  [phone]  Name: Jacob Holder MRN: [id number] Date of Birth: [birth year]

## 2019-07-23 ENCOUNTER — Telehealth: Payer: Self-pay | Admitting: *Deleted

## 2019-07-23 NOTE — Telephone Encounter (Signed)
Mitch, [facility] bridge counselor, checked patient's status with [place] [facility]. Frederick Peers, [facility] Bridge Counselor, will reach out to the patient to bring him into care at [facility]. Andree Coss, RN

## 2019-07-24 ENCOUNTER — Other Ambulatory Visit: Payer: Self-pay

## 2019-07-24 ENCOUNTER — Encounter: Payer: Self-pay | Admitting: Occupational Therapy

## 2019-07-24 ENCOUNTER — Ambulatory Visit: Payer: Managed Care, Other (non HMO) | Admitting: Occupational Therapy

## 2019-07-24 VITALS — BP 120/64

## 2019-07-24 DIAGNOSIS — R41844 Frontal lobe and executive function deficit: Secondary | ICD-10-CM | POA: Diagnosis not present

## 2019-07-24 DIAGNOSIS — R41842 Visuospatial deficit: Secondary | ICD-10-CM

## 2019-07-24 DIAGNOSIS — R41840 Attention and concentration deficit: Secondary | ICD-10-CM

## 2019-07-24 NOTE — Therapy (Signed)
[facility] [address] [facility] [place], [place], [address] Phone: [phone]   Fax:  [phone]  Occupational Therapy Treatment  Patient Details  Name: Jacob Holder MRN: [id number] Date of Birth: May 28, 1991 Referring Provider (OT): Dr. Eulogio Bear   Encounter Date: 07/24/2019  OT End of Session - 07/24/19 1904    Visit Number  4    Number of Visits  25    Date for OT Re-Evaluation  10/08/19    Authorization Type  Cigna    Authorization - Visit Number  4    Authorization - Number of Visits  42    OT Start Time  2376    OT Stop Time  1745    OT Time Calculation (min)  43 min       Past Medical History:  Diagnosis Date  . Aneurysm ([place])   . Anxiety   . Cocaine abuse ([place])   . Depression   . GERD (gastroesophageal reflux disease)   . Headache   . Stroke ([place])   . Wears contact lenses     Past Surgical History:  Procedure Laterality Date  . IR ANGIO INTRA EXTRACRAN SEL COM CAROTID INNOMINATE BILAT MOD SED  03/05/2019  . IR ANGIO VERTEBRAL SEL VERTEBRAL BILAT MOD SED  03/05/2019  . IR ANGIO VERTEBRAL SEL VERTEBRAL UNI R MOD SED  06/26/2019  . IR ANGIOGRAM FOLLOW UP STUDY  06/26/2019  . IR NEURO EACH ADD'L AFTER BASIC UNI RIGHT (MS)  06/26/2019  . IR TRANSCATH/EMBOLIZ  06/26/2019  . RADIOLOGY WITH ANESTHESIA N/A 06/26/2019   Procedure: EMBOLIZATION;  Surgeon: Luanne Bras, MD;  Location: [place];  Service: Radiology;  Laterality: N/A;  . RECTAL SURGERY    . TOOTH EXTRACTION      Vitals:   07/24/19 1710  BP: 120/64    Subjective Assessment - 07/24/19 1903    Subjective   Pt reports he is still having word finding difficulties    Pertinent History  Pt is a 29 y.o. 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 Following coiling, pt developed acute vision loss and confusion. MRI at the time showing scattered acute infarcts in the bilateral parietal  lobes, left occipital lobe and right cerebellum in addition to [facility] and IVH. Pt signed out AMA. Pt readmitted 07/03/2019 with memory issues and headache. MRI showing new infarct invovling the left cerebellum    Patient Stated Goals  to improve vision and thinking abilities    Currently in Pain?  No/denies               Treatment: Typing activity clouds to address visual scanning, multi tasking and typing in prep for work activities, min difficulty. Organizing your day task for reading comprehension and scanning in prep for work, only 1 omission, increased time required. Word search issued as homework. Pt reports increased visual difficulties, however this may be as a result of visual fatigue. Pt was encouraged to take rest breaks if eyes become fatigued. Pt reports he contacted infectious disease clinic today and is waiting on call back.              OT Short Term Goals - 07/10/19 1520      OT SHORT TERM GOAL #1   Title  Pt will verbalize understanding of compensatory strategies for visual perceptual deficits.-08/24/19    Time  6    Period  Weeks    Status  New  Target Date  08/24/19      OT SHORT TERM GOAL #2   Title  Pt will verbalize understanding of compensatory strategies for short term memory deficits.    Time  6    Period  Weeks    Status  New      OT SHORT TERM GOAL #3   Title  Pt will perform environmental scanning with 80% or better accuracy.    Time  6    Period  Weeks    Status  New      OT SHORT TERM GOAL #4   Title  Pt will perfom basic cooking modified indpendently demonstrating good safety awareness.    Time  6    Period  Weeks    Status  New      OT SHORT TERM GOAL #5   Title  Pt will perform tabletop scanning activities with 95% or better accuracy.    Time  6    Period  Weeks    Status  New        OT Long Term Goals - 07/10/19 1010      OT LONG TERM GOAL #1   Title  Pt will perform mod complex environmantal scanning tasks with  90% ofr better accuracy.    Time  12    Period  Weeks    Status  New      OT LONG TERM GOAL #2   Title  Pt will perfrom simulated work tasks with 90% or better accuracy.    Time  12    Period  Weeks    Status  New      OT LONG TERM GOAL #3   Title  Pt will perform a physical and cognitive task simultaneously in prep for work activities/ driving.    Time  12    Period  Weeks    Status  New      OT LONG TERM GOAL #4   Title  Pt will perform mod complex cooking modified indpendently demonstrating good safety awareness.    Time  12    Period  Weeks    Status  New            Plan - 07/24/19 1904    Clinical Impression Statement  Pt demonstrates good overall progress with visual perceptual skills. Pt was encouraged to perfrom tasks that are visually dependent, then to take a break if visual fatigue.    OT Occupational Profile and History  Detailed Assessment- Review of Records and additional review of physical, cognitive, psychosocial history related to current functional performance    Occupational performance deficits (Please refer to evaluation for details):  ADL's;IADL's;Work;Play;Leisure;Social Participation    Body Structure / Function / Physical Skills  ADL;Vision;Decreased knowledge of precautions    Cognitive Skills  Attention;Memory;Orientation;Problem Solve;Perception;Safety Awareness;Sequencing;Thought;Understand    Rehab Potential  Good    Clinical Decision Making  Limited treatment options, no task modification necessary    Comorbidities Affecting Occupational Performance:  May have comorbidities impacting occupational performance    Modification or Assistance to Complete Evaluation   No modification of tasks or assist necessary to complete eval    OT Frequency  2x / week   plus eval, may d/c after 8 weeks dep on progress.   OT Duration  12 weeks    OT Treatment/Interventions  Self-care/ADL training;Visual/perceptual remediation/compensation;Patient/family  education;Therapeutic activities;Therapeutic exercise;Cognitive remediation/compensation    Plan  environmental scanning with a cognitive component, activities on i-pad to simulate vision,  simple cooking task to look at safety.    Consulted and Agree with Plan of Care  Patient       Patient will benefit from skilled therapeutic intervention in order to improve the following deficits and impairments:   Body Structure / Function / Physical Skills: ADL, Vision, Decreased knowledge of precautions Cognitive Skills: Attention, Memory, Orientation, Problem Solve, Perception, Safety Awareness, Sequencing, Thought, Understand     Visit Diagnosis: Attention and concentration deficit  Visuospatial deficit  Frontal lobe and executive function deficit    Problem List Patient Active Problem List   Diagnosis Date Noted  . HIV disease (HCC) 07/09/2019  . Normochromic normocytic anemia 07/04/2019  . Stroke (HCC) 07/04/2019  . Drug use 07/04/2019  . Cocaine abuse (HCC)   . Acute CVA (cerebrovascular accident) (HCC) 07/03/2019  . Hypokalemia   . History of ETT   . SAH (subarachnoid hemorrhage) (HCC) 06/27/2019  . IVH (intraventricular hemorrhage) (HCC) 06/27/2019  . 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 Aphasia 06/27/2019  . 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/26/2019  . Acute respiratory failure with hypoxemia (HCC)   . Acute metabolic encephalopathy   . Essential hypertension     Kaleen Rochette 07/24/2019, 7:07 PM  [facility] [address] [place], [place], [address] Phone: [phone]   Fax:  [phone]  Name: Boen Sterbenz MRN: [id number] Date of Birth: 11/10/1990

## 2019-07-25 ENCOUNTER — Ambulatory Visit: Payer: Managed Care, Other (non HMO) | Admitting: Occupational Therapy

## 2019-07-26 ENCOUNTER — Telehealth: Payer: Self-pay

## 2019-07-26 NOTE — Telephone Encounter (Signed)
Received a call from [facility] Department regarding labs done on 12/31. Health department would like to know if patient is establishing care with our office. Provided appointment information.  Jacob Holder, [place]

## 2019-07-30 ENCOUNTER — Telehealth: Payer: Self-pay

## 2019-07-30 NOTE — Telephone Encounter (Signed)
COVID-19 Pre-Screening Questions:07/30/19  Do you currently have a fever (>100 F), chills or unexplained body aches? NO   Are you currently experiencing new cough, shortness of breath, sore throat, runny nose? NO .  Have you recently travelled outside the state of [place] in the last 14 days? NO .  Have you been in contact with someone that is currently pending confirmation of Covid19 testing or has been confirmed to have the Covid19 virus?  NO   **If the patient answers NO to ALL questions -  advise the patient to please call the clinic before coming to the office should any symptoms develop.     

## 2019-07-31 ENCOUNTER — Ambulatory Visit: Payer: Managed Care, Other (non HMO) | Admitting: Occupational Therapy

## 2019-07-31 ENCOUNTER — Other Ambulatory Visit: Payer: Managed Care, Other (non HMO)

## 2019-07-31 ENCOUNTER — Other Ambulatory Visit: Payer: Self-pay

## 2019-07-31 ENCOUNTER — Ambulatory Visit: Payer: Managed Care, Other (non HMO)

## 2019-07-31 ENCOUNTER — Other Ambulatory Visit (HOSPITAL_COMMUNITY)
Admission: RE | Admit: 2019-07-31 | Discharge: 2019-07-31 | Disposition: A | Discharge status: Home / Self Care | Payer: Managed Care, Other (non HMO) | Source: Ambulatory Visit | Attending: Infectious Diseases | Admitting: Infectious Diseases

## 2019-07-31 DIAGNOSIS — R41844 Frontal lobe and executive function deficit: Secondary | ICD-10-CM | POA: Diagnosis not present

## 2019-07-31 DIAGNOSIS — B20 Human immunodeficiency virus [HIV] disease: Secondary | ICD-10-CM | POA: Insufficient documentation

## 2019-07-31 DIAGNOSIS — Z79899 Other long term (current) drug therapy: Secondary | ICD-10-CM

## 2019-07-31 DIAGNOSIS — Z113 Encounter for screening for infections with a predominantly sexual mode of transmission: Secondary | ICD-10-CM

## 2019-07-31 DIAGNOSIS — R41840 Attention and concentration deficit: Secondary | ICD-10-CM

## 2019-07-31 DIAGNOSIS — R41842 Visuospatial deficit: Secondary | ICD-10-CM

## 2019-08-01 LAB — URINE CYTOLOGY ANCILLARY ONLY
Chlamydia: NEGATIVE
Comment: NEGATIVE
Comment: NORMAL
Neisseria Gonorrhea: NEGATIVE

## 2019-08-01 LAB — T-HELPER CELL (CD4) - (RCID CLINIC ONLY)
CD4 % Helper T Cell: 25 % — ABNORMAL LOW (ref 33–65)
CD4 T Cell Abs: 478 /uL (ref 400–1790)

## 2019-08-01 NOTE — Therapy (Signed)
[facility] [address] [place], [place], [address] Phone: [phone]   Fax:  [phone]  Occupational Therapy Treatment  Jacob Holder Details  Name: Jacob Holder MRN: [id number] Date of Birth: [birth year] Referring Provider (OT): Dr. Eulogio Bear   Encounter Date: 07/31/2019  OT End of Session - 07/31/19 1844    Visit Number  5    Number of Visits  25    Date for OT Re-Evaluation  10/08/19    Authorization Type  Cigna    Authorization - Visit Number  5    Authorization - Number of Visits  74    OT Start Time  2355    OT Stop Time  1745    OT Time Calculation (min)  40 min    Activity Tolerance  Jacob Holder tolerated treatment well    Behavior During Therapy  [facility] for tasks assessed/performed       Past Medical History:  Diagnosis Date  . Aneurysm ([place])   . Anxiety   . Cocaine abuse ([place])   . Depression   . GERD (gastroesophageal reflux disease)   . Headache   . Stroke ([place])   . Wears contact lenses     Past Surgical History:  Procedure Laterality Date  . IR ANGIO INTRA EXTRACRAN SEL COM CAROTID INNOMINATE BILAT MOD SED  03/05/2019  . IR ANGIO VERTEBRAL SEL VERTEBRAL BILAT MOD SED  03/05/2019  . IR ANGIO VERTEBRAL SEL VERTEBRAL UNI R MOD SED  06/26/2019  . IR ANGIOGRAM FOLLOW UP STUDY  06/26/2019  . IR NEURO EACH ADD'L AFTER BASIC UNI RIGHT ([place])  06/26/2019  . IR TRANSCATH/EMBOLIZ  06/26/2019  . RADIOLOGY WITH ANESTHESIA N/A 06/26/2019   Procedure: EMBOLIZATION;  Surgeon: Luanne Bras, MD;  Location: [place];  Service: Radiology;  Laterality: N/A;  . RECTAL SURGERY    . TOOTH EXTRACTION      There were no vitals filed for this visit.  Subjective Assessment - 07/31/19 1849    Pertinent History  Jacob Holder is a 29 y.o. 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 Following coiling, Jacob Holder developed acute vision loss and confusion. MRI at the time showing  scattered acute infarcts in the bilateral parietal lobes, left occipital lobe and right cerebellum in addition to [facility] and IVH. Jacob Holder signed out AMA. Jacob Holder readmitted 07/03/2019 with memory issues and headache. MRI showing new infarct invovling the left cerebellum    Jacob Holder Stated Goals  to improve vision and thinking abilities    Currently in Pain?  No/denies            Treatment: Basic cooking task to scramble eggs. Jacob Holder located all items in and unfamiliar kitchen and completed the task demonstrating good safety awareness. Jacob Holder washed his dishes independently. Jacob Holder performed map reading task on constant therapy level 2 with 100% accuracy( to address scanning and reading comprehension. Jacob Holder performed math task on constant therapy with 100% accuracy. Therapist faxed a request for ST to Jacob Holder's PCP, per Jacob Holder request.                OT Short Term Goals - 07/10/19 1520      OT SHORT TERM GOAL #1   Title  Jacob Holder will verbalize understanding of compensatory strategies for visual perceptual deficits.-08/24/19    Time  6    Period  Weeks    Status  New    Target Date  08/24/19  OT SHORT TERM GOAL #2   Title  Jacob Holder will verbalize understanding of compensatory strategies for short term memory deficits.    Time  6    Period  Weeks    Status  New      OT SHORT TERM GOAL #3   Title  Jacob Holder will perform environmental scanning with 80% or better accuracy.    Time  6    Period  Weeks    Status  New      OT SHORT TERM GOAL #4   Title  Jacob Holder will perfom basic cooking modified indpendently demonstrating good safety awareness.    Time  6    Period  Weeks    Status  New      OT SHORT TERM GOAL #5   Title  Jacob Holder will perform tabletop scanning activities with 95% or better accuracy.    Time  6    Period  Weeks    Status  New        OT Long Term Goals - 07/10/19 1010      OT LONG TERM GOAL #1   Title  Jacob Holder will perform mod complex environmantal scanning tasks with 90% ofr better accuracy.    Time  12     Period  Weeks    Status  New      OT LONG TERM GOAL #2   Title  Jacob Holder will perfrom simulated work tasks with 90% or better accuracy.    Time  12    Period  Weeks    Status  New      OT LONG TERM GOAL #3   Title  Jacob Holder will perform a physical and cognitive task simultaneously in prep for work activities/ driving.    Time  12    Period  Weeks    Status  New      OT LONG TERM GOAL #4   Title  Jacob Holder will perform mod complex cooking modified indpendently demonstrating good safety awareness.    Time  12    Period  Weeks    Status  New            Plan - 07/31/19 1845    Clinical Impression Statement  Jacob Holder demonstrates good overall progress with visual perceptual skills. Jacob Holder demonstrates good safety awareness during cooking task.    OT Occupational Profile and History  Detailed Assessment- Review of Records and additional review of physical, cognitive, psychosocial history related to current functional performance    Occupational performance deficits (Please refer to evaluation for details):  ADL's;IADL's;Work;Play;Leisure;Social Participation    Body Structure / Function / Physical Skills  ADL;Vision;Decreased knowledge of precautions    Cognitive Skills  Attention;Memory;Orientation;Problem Solve;Perception;Safety Awareness;Sequencing;Thought;Understand    Rehab Potential  Good    Clinical Decision Making  Limited treatment options, no task modification necessary    Comorbidities Affecting Occupational Performance:  May have comorbidities impacting occupational performance    Modification or Assistance to Complete Evaluation   No modification of tasks or assist necessary to complete eval    OT Frequency  2x / week   plus eval, may d/c after 8 weeks dep on progress.   OT Duration  12 weeks    OT Treatment/Interventions  Self-care/ADL training;Visual/perceptual remediation/compensation;Jacob Holder/family education;Therapeutic activities;Therapeutic exercise;Cognitive remediation/compensation     Plan  environmental scanning with a cognitive component, activities on i-pad to simulate vision    Consulted and Agree with Plan of Care  Jacob Holder       Jacob Holder  will benefit from skilled therapeutic intervention in order to improve the following deficits and impairments:   Body Structure / Function / Physical Skills: ADL, Vision, Decreased knowledge of precautions Cognitive Skills: Attention, Memory, Orientation, Problem Solve, Perception, Safety Awareness, Sequencing, Thought, Understand     Visit Diagnosis: Visuospatial deficit  Frontal lobe and executive function deficit  Attention and concentration deficit    Problem List Jacob Holder Active Problem List   Diagnosis Date Noted  . HIV disease (HCC) 07/09/2019  . Normochromic normocytic anemia 07/04/2019  . Stroke (HCC) 07/04/2019  . Drug use 07/04/2019  . Cocaine abuse (HCC)   . Acute CVA (cerebrovascular accident) (HCC) 07/03/2019  . Hypokalemia   . History of ETT   . SAH (subarachnoid hemorrhage) (HCC) 06/27/2019  . IVH (intraventricular hemorrhage) (HCC) 06/27/2019  . 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 Aphasia 06/27/2019  . 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/26/2019  . Acute respiratory failure with hypoxemia (HCC)   . Acute metabolic encephalopathy   . Essential hypertension     Zari Cly 08/01/2019, 10:05 AM  [facility] [address] [place], [place], [address] Phone: [phone]   Fax:  [phone]  Name: Kaylum Shrum MRN: [id number] Date of Birth: [birth year]

## 2019-08-06 ENCOUNTER — Telehealth (HOSPITAL_COMMUNITY): Payer: Self-pay

## 2019-08-06 ENCOUNTER — Ambulatory Visit: Payer: Managed Care, Other (non HMO) | Attending: Internal Medicine | Admitting: Occupational Therapy

## 2019-08-06 ENCOUNTER — Other Ambulatory Visit: Payer: Self-pay

## 2019-08-06 DIAGNOSIS — R41844 Frontal lobe and executive function deficit: Secondary | ICD-10-CM | POA: Diagnosis present

## 2019-08-06 DIAGNOSIS — R41842 Visuospatial deficit: Secondary | ICD-10-CM | POA: Insufficient documentation

## 2019-08-06 DIAGNOSIS — R41840 Attention and concentration deficit: Secondary | ICD-10-CM | POA: Insufficient documentation

## 2019-08-06 NOTE — Telephone Encounter (Signed)
Pt called upset because he says we did not put in his paperwork anything regarding his memory loss and physical therapy. I returned his called to let him know that all of his records were faxed including his last consult note stating that his memory was getting better and that he is going to physical therapy for this reason. The PA faxed over 110 pages worth of all his notes including forms. AW

## 2019-08-06 NOTE — Therapy (Signed)
[facility] [address] [place], [place], [address] Phone: [phone]   Fax:  [phone]  Occupational Therapy Treatment  Patient Details  Name: Jacob Holder MRN: [id number] Date of Birth: 1991-07-03 Referring Provider (OT): Dr. Eulogio Bear   Encounter Date: 08/06/2019  OT End of Session - 08/06/19 1300    Visit Number  6    Number of Visits  25    Date for OT Re-Evaluation  10/08/19    Authorization Type  [employer]    Authorization - Visit Number  6    Authorization - Number of Visits  60    OT Start Time  0277    OT Stop Time  1309    OT Time Calculation (min)  49 min    Activity Tolerance  Patient tolerated treatment well    Behavior During Therapy  [facility] for tasks assessed/performed       Past Medical History:  Diagnosis Date  . Aneurysm ([place])   . Anxiety   . Cocaine abuse ([place])   . Depression   . GERD (gastroesophageal reflux disease)   . Headache   . Stroke ([place])   . Wears contact lenses     Past Surgical History:  Procedure Laterality Date  . IR ANGIO INTRA EXTRACRAN SEL COM CAROTID INNOMINATE BILAT MOD SED  03/05/2019  . IR ANGIO VERTEBRAL SEL VERTEBRAL BILAT MOD SED  03/05/2019  . IR ANGIO VERTEBRAL SEL VERTEBRAL UNI R MOD SED  06/26/2019  . IR ANGIOGRAM FOLLOW UP STUDY  06/26/2019  . IR NEURO EACH ADD'L AFTER BASIC UNI RIGHT (MS)  06/26/2019  . IR TRANSCATH/EMBOLIZ  06/26/2019  . RADIOLOGY WITH ANESTHESIA N/A 06/26/2019   Procedure: EMBOLIZATION;  Surgeon: Luanne Bras, MD;  Location: [facility];  Service: Radiology;  Laterality: N/A;  . RECTAL SURGERY    . TOOTH EXTRACTION      There were no vitals filed for this visit.  Subjective Assessment - 08/06/19 1324    Subjective   Pt reports that he has a lot going on    Pertinent History  Pt is a 29 y.o. 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 Following coiling, pt developed acute  vision loss and confusion. MRI at the time showing scattered acute infarcts in the bilateral parietal lobes, left occipital lobe and right cerebellum in addition to [facility] and IVH. Pt signed out AMA. Pt readmitted 07/03/2019 with memory issues and headache. MRI showing new infarct invovling the left cerebellum    Patient Stated Goals  to improve vision and thinking abilities    Currently in Pain?  No/denies               Treatment: Money task on constant therapy with 80% accuracy,  Map reading task, level 5 with 70% accuracy, pt reports this task was challenging due to vision. Ambulating while performing category generation, and tossing a ball in prep for work activities,  min-mod v.c to generate words, pt reports this task was very challenging.            OT Education - 08/06/19 1324    Education Details  memory compensations, keeping thinking skills sharp    Person(s) Educated  Patient    Methods  Explanation;Verbal cues;Handout    Comprehension  Verbalized understanding       OT Short Term Goals - 07/10/19 1520      OT SHORT TERM GOAL #1  Title  Pt will verbalize understanding of compensatory strategies for visual perceptual deficits.-08/24/19    Time  6    Period  Weeks    Status  New    Target Date  08/24/19      OT SHORT TERM GOAL #2   Title  Pt will verbalize understanding of compensatory strategies for short term memory deficits.    Time  6    Period  Weeks    Status  New      OT SHORT TERM GOAL #3   Title  Pt will perform environmental scanning with 80% or better accuracy.    Time  6    Period  Weeks    Status  New      OT SHORT TERM GOAL #4   Title  Pt will perfom basic cooking modified indpendently demonstrating good safety awareness.    Time  6    Period  Weeks    Status  New      OT SHORT TERM GOAL #5   Title  Pt will perform tabletop scanning activities with 95% or better accuracy.    Time  6    Period  Weeks    Status  New        OT  Long Term Goals - 07/10/19 1010      OT LONG TERM GOAL #1   Title  Pt will perform mod complex environmantal scanning tasks with 90% ofr better accuracy.    Time  12    Period  Weeks    Status  New      OT LONG TERM GOAL #2   Title  Pt will perfrom simulated work tasks with 90% or better accuracy.    Time  12    Period  Weeks    Status  New      OT LONG TERM GOAL #3   Title  Pt will perform a physical and cognitive task simultaneously in prep for work activities/ driving.    Time  12    Period  Weeks    Status  New      OT LONG TERM GOAL #4   Title  Pt will perform mod complex cooking modified indpendently demonstrating good safety awareness.    Time  12    Period  Weeks    Status  New            Plan - 08/06/19 1301    Clinical Impression Statement  Pt reports feeling overwhelmed and frustrated. He has not been approved for his short term disability at work and we haven't received the referral for ST yet. (Therapist faxed a request last week and also called Dr. Rubye Oaks office today).    OT Occupational Profile and History  Detailed Assessment- Review of Records and additional review of physical, cognitive, psychosocial history related to current functional performance    Occupational performance deficits (Please refer to evaluation for details):  ADL's;IADL's;Work;Play;Leisure;Social Participation    Body Structure / Function / Physical Skills  ADL;Vision;Decreased knowledge of precautions    Cognitive Skills  Attention;Memory;Orientation;Problem Solve;Perception;Safety Awareness;Sequencing;Thought;Understand    Rehab Potential  Good    Clinical Decision Making  Limited treatment options, no task modification necessary    Comorbidities Affecting Occupational Performance:  May have comorbidities impacting occupational performance    Modification or Assistance to Complete Evaluation   No modification of tasks or assist necessary to complete eval    OT Frequency  2x /  week  plus eval, may d/c after 8 weeks dep on progress.   OT Duration  12 weeks    OT Treatment/Interventions  Self-care/ADL training;Visual/perceptual remediation/compensation;Patient/family education;Therapeutic activities;Therapeutic exercise;Cognitive remediation/compensation    Plan  environmental scanning with a cognitive component, activities on i-pad simulate work activities, Event organiser with Plan of Care  Patient       Patient will benefit from skilled therapeutic intervention in order to improve the following deficits and impairments:   Body Structure / Function / Physical Skills: ADL, Vision, Decreased knowledge of precautions Cognitive Skills: Attention, Memory, Orientation, Problem Solve, Perception, Safety Awareness, Sequencing, Thought, Understand     Visit Diagnosis: Visuospatial deficit  Frontal lobe and executive function deficit  Attention and concentration deficit    Problem List Patient Active Problem List   Diagnosis Date Noted  . HIV disease (H[facility]) 07/09/2019  . Normochromic normocytic anemia 07/04/2019  . Stroke ([facility]) 07/04/2019  . Drug use 07/04/2019  . Cocaine abuse ([facility])   . Acute CVA (cerebrovascular accident) (HCC) 07/03/2019  . Hypokalemia   . History of ETT   . SAH (subarachnoid hemorrhage) (HCC) 06/27/2019  . IVH (intraventricular hemorrhage) (HCC) 06/27/2019  . 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 Aphasia 06/27/2019  . 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/26/2019  . Acute respiratory failure with hypoxemia ([facility])   . Acute metabolic encephalopathy   . Essential hypertension     Notnamed Croucher 08/06/2019, 1:25 PM  [facility] [address] [place], [place], [address] Phone: [phone]   Fax:  [phone]  Name: Jacob Holder MRN:  [id number] Date of Birth: 1990-07-07

## 2019-08-06 NOTE — Patient Instructions (Addendum)
Memory Compensation Strategies  1. Use "WARM" strategy. W= write it down A=  associate it R=  repeat it M=  make a mental picture  2. You can keep a Glass blower/designer. Use a 3-ring notebook with sections for the following:  calendar, important names and phone numbers, medications, doctors' names/phone numbers, "to do list"/reminders, and a section to journal what you did each day  3. Use a calendar to write appointments down.  4. Write yourself a schedule for the day.  This can be placed on the calendar or in a separate section of the Memory Notebook.  Keeping a regular schedule can help memory.  5. Use medication organizer with sections for each day or morning/evening pills  You may need help loading it  6. Keep a basket, or pegboard by the door.   Place items that you need to take out with you in the basket or on the pegboard.  You may also want to include a message board for reminders.  7. Use sticky notes. Place sticky notes with reminders in a place where the task is performed.  For example:  "turn off the stove" placed by the stove, "lock the door" placed on the door at eye level, "take your medications" on the bathroom mirror or by the place where you normally take your medications  8. Use alarms/timers.  Use while cooking to remind yourself to check on food or as a reminder to take your medicine, or as a reminder to make a call, or as a reminder to perform another task, etc.  9. Use a small tape recorder to record important information and notes for yourself.    Keeping Thinking Skills Sharp: 1. Jigsaw puzzles 2. Card/board games 3. Talking on the phone/social events 4. [email or web address] 5. Online games 6. Word searches/crossword puzzles 7.  Logic puzzles 8. Aerobic exercise (stationary bike) 9. Eating balanced diet (fruits & veggies) 10. Drink water 11. Try something new--new recipe, hobby 12. Crafts 13. Do a variety of activities that are challenging 14 think of  animal/food/city with each letter of the alphabet, counting backwards, thinking of as many vegetables as you can, etc

## 2019-08-08 ENCOUNTER — Other Ambulatory Visit: Payer: Self-pay

## 2019-08-08 ENCOUNTER — Encounter: Payer: Self-pay | Admitting: Adult Health

## 2019-08-08 ENCOUNTER — Ambulatory Visit (INDEPENDENT_AMBULATORY_CARE_PROVIDER_SITE_OTHER): Payer: Managed Care, Other (non HMO) | Admitting: Adult Health

## 2019-08-08 VITALS — BP 122/64 | HR 68 | Temp 98.7°F | Ht 71.0 in | Wt 161.6 lb

## 2019-08-08 DIAGNOSIS — I69398 Other sequelae of cerebral infarction: Secondary | ICD-10-CM

## 2019-08-08 DIAGNOSIS — F331 Major depressive disorder, recurrent, moderate: Secondary | ICD-10-CM

## 2019-08-08 DIAGNOSIS — I69319 Unspecified symptoms and signs involving cognitive functions following cerebral infarction: Secondary | ICD-10-CM

## 2019-08-08 DIAGNOSIS — I10 Essential (primary) hypertension: Secondary | ICD-10-CM

## 2019-08-08 DIAGNOSIS — I63433 Cerebral infarction due to embolism of bilateral posterior cerebral arteries: Secondary | ICD-10-CM

## 2019-08-08 DIAGNOSIS — H539 Unspecified visual disturbance: Secondary | ICD-10-CM

## 2019-08-08 DIAGNOSIS — E785 Hyperlipidemia, unspecified: Secondary | ICD-10-CM

## 2019-08-08 MED ORDER — BUSPIRONE HCL 5 MG PO TABS: 5.0000 mg | ORAL_TABLET | Freq: Three times a day (TID) | ORAL | 2 refills | Status: DC

## 2019-08-08 NOTE — Patient Instructions (Signed)
Continue aspirin 81 mg daily and clopidogrel 75 mg daily  and lipitor  for secondary stroke prevention  Continue to follow up with PCP regarding cholesterol and blood pressure management   You will be called to scheduled initial evaluation with behavioral medicine - increase buspar 5mg  in the Am and 10mg  PM  Schedule evaluation with speech therapy for ongoing cognitive issues  You will be called to schedule eval by Dr. for ongoing visual issues  Follow up with Dr. office for repeat imaging as recommend   Continue to monitor blood pressure at home  Maintain strict control of hypertension with blood pressure goal below 130/90, diabetes with hemoglobin A1c goal below 6.5% and cholesterol with LDL cholesterol (bad cholesterol) goal below 70 mg/dL. I also advised the patient to eat a healthy diet with plenty of whole grains, cereals, fruits and vegetables, exercise regularly and maintain ideal body weight.  Followup in the future with me in 3 months or call earlier if needed       Thank you for coming to see Dione Booze at [facility]. I hope we have been able to provide you high quality care today.  You may receive a patient satisfaction survey over the next few weeks. We would appreciate your feedback and comments so that we may continue to improve ourselves and the health of our patients.

## 2019-08-08 NOTE — Progress Notes (Signed)
[facility] [address] [place]. [place] [address] [phone]       HOSPITAL FOLLOW UP NOTE  [name] Date of Birth:  [birth year] Medical Record Number:  [id number]   Reason for Referral:  hospital stroke follow up    CHIEF COMPLAINT:  Chief Complaint  Patient presents with  . Hospitalization Follow-up    Alone. Rm 7. Patient mentioned that he has been some blurry vision. He stated he is working with PT.     HPI: Jacob Holder being seen today for in office hospital follow-up regarding multiple strokes post BA aneurysm repair and cocaine use.  History obtained from patient and chart review. Reviewed all radiology images and labs personally.  [name] is a 29 y.o. 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 Estanislado Holder who developed loss of vision, agitation and confusion post procedure.  MRI revealed bilateral parietal, left occipital and right cerebellar infarcts along with small amount of basilar cistern subarachnoid hemorrhage.  Evaluated by stroke team and Dr. Leonie Holder for scattered bilateral embolic infarcts with SAH and IVH post BA aneurysm repair in setting of cocaine use.  CTA head/neck showed stent from mid basilar artery into the left P2 segment.  CT head showed left basilar cistern SAH stable with trace IVH and repeat CT head decreasing intraventricular blood products.  2D echo showed an EF of 60 to 65% without cardiac source of embolus identified.  Recommended DAPT on discharge due to stent and ongoing management by Dr. Estanislado Holder.  History of HTN currently on verapamil 80 mg 3 times daily stable during admission.  He was also previously started on verapamil due to diagnosis of cluster headaches.  LDL 73 and A1c 5.5.  UDS positive for cocaine with discussion regarding importance of cessation.  He was noted to have cocaine withdrawal-induced  agitation during admission.  Current tobacco use with smoking cessation counseling provided.  Other stroke risk factors include EtOH use and advise no more than 2 drinks daily and recent diagnosis of HIV.  Other active problems include anxiety/depression, GERD and hypokalemia.  Recommended participation with outpatient therapies but unfortunately patient left AMA on 06/30/2019. He returned to ED on 07/02/2019 with complaints of memory loss and headache.  He underwent CT head which were unremarkable and left without being seen. He returned on 07/03/2019 due to decreased memory, numbness/tingling in hands bilaterally, headaches and "not feeling right".  Evaluated by stroke team and Dr. Erlinda Holder with stroke work-up revealing new left cerebellar infarct as evidenced on MRI new from 12/24 in setting of post BA embolization/stenting infarcts noncompliant with medications after discharge.  Advised DAPT and ensure compliance for secondary stroke prevention.  Also advised to restart atorvastatin 40 mg daily with prior LDL 73 for secondary stroke prevention.  Repeat UDS negative.  He was discharged home in stable condition with recommendation of follow-up with Dr. Estanislado Holder outpatient for monitoring management.  [name] is a 29 year old male who is being seen today for hospital follow-up.  Residual deficits include subjective memory concerns, difficulty with vision stating difficulty with focusing and mildly blurred vision but denies peripheral visual field loss or diplopia.  He continues to participate with OT with ongoing improvement.  He has not been evaluated by ophthalmology at this time.  He does endorse improvement as he initially had difficulty with walking into objects but this has since improved as well as improvement of headaches.  He  does endorse great difficulty with depression/anxiety currently on BuSpar which was initiated during hospitalization with mild benefit.  He does have prior history of  depression/anxiety with suicide ideation.  He denies suicidal ideation at this time.  He does not have established psychologist/psychiatrist at this time but is interested in establishing care.  He does endorse ongoing compliance with aspirin and Plavix without bleeding or bruising.  Ongoing compliance with atorvastatin without myalgias.  Blood pressure today 122/64.  Ongoing tobacco use currently smoking approximately one fourth of a pack where prior 1 pack daily.  He denies continued substance abuse with THC or cocaine stating "I learned my lesson" in regards to increased risk with ongoing use.  Denies new or worsening stroke/TIA symptoms.    ROS:   14 system review of systems performed and negative with exception of depression, anxiety, memory loss, blurred vision  PMH:  Past Medical History:  Diagnosis Date  . Aneurysm (HCC)   . Anxiety   . Cocaine abuse (HCC)   . Depression   . GERD (gastroesophageal reflux disease)   . Headache   . Stroke (HCC)   . Wears contact lenses     PSH:  Past Surgical History:  Procedure Laterality Date  . IR ANGIO INTRA EXTRACRAN SEL COM CAROTID INNOMINATE BILAT MOD SED  03/05/2019  . IR ANGIO VERTEBRAL SEL VERTEBRAL BILAT MOD SED  03/05/2019  . IR ANGIO VERTEBRAL SEL VERTEBRAL UNI R MOD SED  06/26/2019  . IR ANGIOGRAM FOLLOW UP STUDY  06/26/2019  . IR NEURO EACH ADD'L AFTER BASIC UNI RIGHT (MS)  06/26/2019  . IR TRANSCATH/EMBOLIZ  06/26/2019  . RADIOLOGY WITH ANESTHESIA N/A 06/26/2019   Procedure: EMBOLIZATION;  Surgeon: Julieanne Cotton, MD;  Location: [facility] OR;  Service: Radiology;  Laterality: N/A;  . RECTAL SURGERY    . TOOTH EXTRACTION      Social History:  Social History   Socioeconomic History  . Marital status: Single    Spouse name: Not on file  . Number of children: 0  . Years of education: Not on file  . Highest education level: Some college, no degree  Occupational History  . Occupation: unemployed  Tobacco Use  . Smoking status:  Current Every Day Smoker    Packs/day: 0.50    Types: Cigarettes  . Smokeless tobacco: Never Used  Substance and Sexual Activity  . Alcohol use: Yes    Comment: from time to time  . Drug use: No  . Sexual activity: Yes    Birth control/protection: Condom  Other Topics Concern  . Not on file  Social History Narrative   Patient is left-handed. He lives in a 2 level home. He drinks tea 2-3 glasses a day, and coffee occassionally.   Social Determinants of Health   Financial Resource Strain:   . Difficulty of Paying Living Expenses: Not on file  Food Insecurity:   . Worried About Programme researcher, broadcasting/film/video in the Last Year: Not on file  . Ran Out of Food in the Last Year: Not on file  Transportation Needs:   . Lack of Transportation (Medical): Not on file  . Lack of Transportation (Non-Medical): Not on file  Physical Activity:   . Days of Exercise per Week: Not on file  . Minutes of Exercise per Session: Not on file  Stress:   . Feeling of Stress : Not on file  Social Connections:   . Frequency of Communication with Friends and Family: Not on file  . Frequency  of Social Gatherings with Friends and Family: Not on file  . Attends Religious Services: Not on file  . Active Member of Clubs or Organizations: Not on file  . Attends Banker Meetings: Not on file  . Marital Status: Not on file  Intimate Partner Violence:   . Fear of Current or Ex-Partner: Not on file  . Emotionally Abused: Not on file  . Physically Abused: Not on file  . Sexually Abused: Not on file    Family History:  Family History  Problem Relation Age of Onset  . Healthy Mother   . Thyroid disease Mother   . Diabetes Father     Medications:   Current Outpatient Medications on File Prior to Visit  Medication Sig Dispense Refill  . aspirin 81 MG chewable tablet Chew 81 mg by mouth daily.    Marland Kitchen atorvastatin (LIPITOR) 40 MG tablet Take 1 tablet (40 mg total) by mouth daily at 6 PM. 30 tablet 0  .  clopidogrel (PLAVIX) 75 MG tablet Take 75 mg by mouth daily.     Marland Kitchen ibuprofen (ADVIL) 200 MG tablet Take 600 mg by mouth every 6 (six) hours as needed for headache.    . verapamil (CALAN) 80 MG tablet Take 1 tablet (80 mg total) by mouth 3 (three) times daily. 90 tablet 3   No current facility-administered medications on file prior to visit.    Allergies:  No Known Allergies   Physical Exam  Vitals:   08/08/19 1402  BP: 122/64  Pulse: 68  Temp: 98.7 F (37.1 C)  TempSrc: Oral  Weight: 161 lb 9.6 oz (73.3 kg)  Height: 5\' 11"  (1.803 m)   Body mass index is 22.54 kg/m. No exam data present  Depression screen [facility] 2/9 08/12/2019  Decreased Interest 2  Down, Depressed, Hopeless 2  PHQ - 2 Score 4  Altered sleeping 2  Tired, decreased energy 3  Change in appetite 1  Feeling bad or failure about yourself  3  Trouble concentrating 3  Moving slowly or fidgety/restless 3  Suicidal thoughts 0  PHQ-9 Score 19  Difficult doing work/chores Very difficult     General: well developed, well nourished,  pleasant young African-American male, seated, in no evident distress Head: head normocephalic and atraumatic.   Neck: supple with no carotid or supraclavicular bruits Cardiovascular: regular rate and rhythm, no murmurs Musculoskeletal: no deformity Skin:  no rash/petichiae Vascular:  Normal pulses all extremities   Neurologic Exam Mental Status: Awake and fully alert.   Normal speech and language.  Oriented to place and time. Recent and remote memory intact. Attention span, concentration and fund of knowledge appropriate. Mood and affect appropriate.  Cranial Nerves: Fundoscopic exam reveals sharp disc margins. Pupils equal, briskly reactive to light. Extraocular movements full without nystagmus. Visual fields full to confrontation with subjective blurred vision. Hearing intact. Facial sensation intact. Face, tongue, palate moves normally and symmetrically.  Motor: Normal bulk and tone.  Normal strength in all tested extremity muscles. Sensory.: intact to touch , pinprick , position and vibratory sensation.  Coordination: Rapid alternating movements normal in all extremities. Finger-to-nose and heel-to-shin performed accurately bilaterally. Gait and Station: Arises from chair without difficulty. Stance is normal. Gait demonstrates normal stride length and balance Reflexes: 1+ and symmetric. Toes downgoing.     NIHSS  0 Modified Rankin  2     ASSESSMENT: Jacob Holder is a 29 y.o. year old male presented with loss of vision, agitation and confusion after embolization  with stent placement of right neck basilar aneurysm on 06/26/2019.  Stroke work-up revealed scattered bilateral embolic infarcts (bilateral parietal, left occipital and right cerebellar infarcts) with SAH and IVH.  He returned on 07/03/2019 with memory deficit, continued headache and numbness/tingling hands bilaterally with finding of new left cerebellar infarct secondary to medication noncompliance.  Vascular risk factors include large BA aneurysm with daily headaches s/p embolization with stent 06/26/2019, cocaine use, recent diagnosis of HIV, HTN, HLD, tobacco use, EtOH use, THC use and medication noncompliance.  Residual stroke deficits of blurred vision and memory impairment but endorses ongoing improvement.  Denies ongoing headaches    PLAN:  1. Multiple strokes as above: Continue aspirin 81 mg daily and clopidogrel 75 mg daily  and atorvastatin for secondary stroke prevention. Maintain strict control of hypertension with blood pressure goal below 130/90, diabetes with hemoglobin A1c goal below 6.5% and cholesterol with LDL cholesterol (bad cholesterol) goal below 70 mg/dL.  I also advised the patient to eat a healthy diet with plenty of whole grains, cereals, fruits and vegetables, exercise regularly with at least 30 minutes of continuous activity daily and maintain ideal body weight. 2. HTN: Advised to  continue current treatment regimen.  Today's BP stable.  Advised to continue to monitor at home along with continued follow-up with PCP for management 3. HLD: Advised to continue current treatment regimen along with continued follow-up with PCP for future prescribing and monitoring of lipid panel 4. Visual and memory impairment, poststroke: Advised to continue to participate in OT with ongoing improvement along with evaluation of speech therapy for reported cognitive concerns.  Also refer to ophthalmology Dr. Dione Booze for further evaluation of prior visual issues along with worsening post stroke. 5. Depression/anxiety: Previously underlying depression/anxiety with history of suicidal ideation.  Patient reports worsening of depression/anxiety post stroke but denies suicidal ideation.  Advised to increase BuSpar frequency from 5 mg twice daily to 5 mg 3 times daily as well as referral to behavioral medicine for further monitoring and management. 6. BA aneurysm: Advised to continue to follow with Dr. Corliss Skains for ongoing surveillance monitoring as recommended 7. Newly diagnosed HIV: Advised to continue to follow with ID for ongoing monitoring and management with follow-up visit scheduled 2/11 8. Polysubstance abuse: Denies ongoing use of cocaine or THC and encouraged ongoing avoidance which patient verbalized understanding 9. Tobacco use: Strongly encouraged complete cessation for secondary stroke prevention    Follow up in 3 months or call earlier if needed   Greater than 50% of time during this 45 minute visit was spent on counseling, explanation of diagnosis of multiple strokes post BA aneurysm repair in setting of cocaine use, reviewing risk factor management of HTN, HLD, BA aneurysm, prior cocaine and THC use, tobacco use and medication noncompliance, discussion regarding importance of medication compliance for stroke prevention, discussion regarding residual deficits planning of further management  along with potential future management, and discussion with patient answering all questions to satisfaction    Ihor Austin, AGNP-BC  [facility] [address] [place], [place] [address]  Phone [phone] Fax [phone] Note: This document was prepared with digital dictation and possible smart phrase technology. Any transcriptional errors that result from this process are unintentional.

## 2019-08-09 ENCOUNTER — Ambulatory Visit: Payer: Managed Care, Other (non HMO) | Admitting: Occupational Therapy

## 2019-08-09 ENCOUNTER — Other Ambulatory Visit: Payer: Self-pay

## 2019-08-09 DIAGNOSIS — R41842 Visuospatial deficit: Secondary | ICD-10-CM

## 2019-08-09 DIAGNOSIS — R41844 Frontal lobe and executive function deficit: Secondary | ICD-10-CM

## 2019-08-09 DIAGNOSIS — R41840 Attention and concentration deficit: Secondary | ICD-10-CM

## 2019-08-09 NOTE — Therapy (Signed)
[facility] [address] [place], [place], [address] Phone: [phone]   Fax:  [phone]  Occupational Therapy Treatment  Patient Details  Name: Jacob Holder MRN: [id number] Date of Birth: 13-Jun-1991 Referring Provider (OT): Dr. Marlin Canary   Encounter Date: 08/09/2019  OT End of Session - 08/09/19 1324    Visit Number  7    Number of Visits  25    Date for OT Re-Evaluation  10/08/19    Authorization Type  [employer]    Authorization - Visit Number  7    Authorization - Number of Visits  60    OT Start Time  1320    OT Stop Time  1400    OT Time Calculation (min)  40 min    Activity Tolerance  Patient tolerated treatment well    Behavior During Therapy  [facility] for tasks assessed/performed       Past Medical History:  Diagnosis Date  . Aneurysm (HCC)   . Anxiety   . Cocaine abuse (HCC)   . Depression   . GERD (gastroesophageal reflux disease)   . Headache   . Stroke (HCC)   . Wears contact lenses     Past Surgical History:  Procedure Laterality Date  . IR ANGIO INTRA EXTRACRAN SEL COM CAROTID INNOMINATE BILAT MOD SED  03/05/2019  . IR ANGIO VERTEBRAL SEL VERTEBRAL BILAT MOD SED  03/05/2019  . IR ANGIO VERTEBRAL SEL VERTEBRAL UNI R MOD SED  06/26/2019  . IR ANGIOGRAM FOLLOW UP STUDY  06/26/2019  . IR NEURO EACH ADD'L AFTER BASIC UNI RIGHT (MS)  06/26/2019  . IR TRANSCATH/EMBOLIZ  06/26/2019  . RADIOLOGY WITH ANESTHESIA N/A 06/26/2019   Procedure: EMBOLIZATION;  Surgeon: Julieanne Cotton, MD;  Location: [facility] OR;  Service: Radiology;  Laterality: N/A;  . RECTAL SURGERY    . TOOTH EXTRACTION      There were no vitals filed for this visit.  Subjective Assessment - 08/09/19 1326    Subjective   Pt reports seeing neurology yesterday    Pertinent History  Pt is a 29 y.o. 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 Following coiling, pt developed acute  vision loss and confusion. MRI at the time showing scattered acute infarcts in the bilateral parietal lobes, left occipital lobe and right cerebellum in addition to [facility] and IVH. Pt signed out AMA. Pt readmitted 07/03/2019 with memory issues and headache. MRI showing new infarct invovling the left cerebellum    Patient Stated Goals  to improve vision and thinking abilities    Currently in Pain?  No/denies                 Treatment:Therapist further assessed vision and pt appears to have a right nasal/ central visual field deficit.  Tabletop scanning with a cognitive component 1.5 M, to locate number that repeats x 4, 66% correct Hidden figures, (Dogs)for increased scanning attention to detail with 100% accuracy Scanning to locate playing cards on the wall and id with pen light, pt reports task is challenging             OT Short Term Goals - 08/09/19 1325      OT SHORT TERM GOAL #1   Title  Pt will verbalize understanding of compensatory strategies for visual perceptual deficits.-08/24/19    Time  6    Period  Weeks    Status  On-going    Target  Date  08/24/19      OT SHORT TERM GOAL #2   Title  Pt will verbalize understanding of compensatory strategies for short term memory deficits.    Time  6    Period  Weeks    Status  Achieved      OT SHORT TERM GOAL #3   Title  Pt will perform environmental scanning with 80% or better accuracy.    Time  6    Period  Weeks    Status  On-going      OT SHORT TERM GOAL #4   Title  Pt will perfom basic cooking modified indpendently demonstrating good safety awareness.    Time  6    Period  Weeks    Status  Achieved      OT SHORT TERM GOAL #5   Title  Pt will perform tabletop scanning activities with 95% or better accuracy.    Time  6    Period  Weeks    Status  On-going        OT Long Term Goals - 07/10/19 1010      OT LONG TERM GOAL #1   Title  Pt will perform mod complex environmantal scanning tasks with 90% ofr  better accuracy.    Time  12    Period  Weeks    Status  New      OT LONG TERM GOAL #2   Title  Pt will perfrom simulated work tasks with 90% or better accuracy.    Time  12    Period  Weeks    Status  New      OT LONG TERM GOAL #3   Title  Pt will perform a physical and cognitive task simultaneously in prep for work activities/ driving.    Time  12    Period  Weeks    Status  New      OT LONG TERM GOAL #4   Title  Pt will perform mod complex cooking modified indpendently demonstrating good safety awareness.    Time  12    Period  Weeks    Status  New            Plan - 08/09/19 1622    Clinical Impression Statement  Pt is progressing towards goals for tabletop visual scanning with a cogntive component.    OT Occupational Profile and History  Detailed Assessment- Review of Records and additional review of physical, cognitive, psychosocial history related to current functional performance    Occupational performance deficits (Please refer to evaluation for details):  ADL's;IADL's;Work;Play;Leisure;Social Participation    Body Structure / Function / Physical Skills  ADL;Vision;Decreased knowledge of precautions    Cognitive Skills  Attention;Memory;Orientation;Problem Solve;Perception;Safety Awareness;Sequencing;Thought;Understand    Rehab Potential  Good    Clinical Decision Making  Limited treatment options, no task modification necessary    Comorbidities Affecting Occupational Performance:  May have comorbidities impacting occupational performance    Modification or Assistance to Complete Evaluation   No modification of tasks or assist necessary to complete eval    OT Frequency  2x / week   plus eval, may d/c after 8 weeks dep on progress.   OT Duration  12 weeks    OT Treatment/Interventions  Self-care/ADL training;Visual/perceptual remediation/compensation;Patient/family education;Therapeutic activities;Therapeutic exercise;Cognitive remediation/compensation    Plan   environmental scanning with a cognitive component, activites on I pad for alternating attention/ vision    Consulted and Agree with Plan of Care  Patient  Patient will benefit from skilled therapeutic intervention in order to improve the following deficits and impairments:   Body Structure / Function / Physical Skills: ADL, Vision, Decreased knowledge of precautions Cognitive Skills: Attention, Memory, Orientation, Problem Solve, Perception, Safety Awareness, Sequencing, Thought, Understand     Visit Diagnosis: Visuospatial deficit  Frontal lobe and executive function deficit  Attention and concentration deficit    Problem List Patient Active Problem List   Diagnosis Date Noted  . HIV disease ([place]) 07/09/2019  . Normochromic normocytic anemia 07/04/2019  . Stroke ([place]) 07/04/2019  . Drug use 07/04/2019  . Cocaine abuse ([place])   . Acute CVA (cerebrovascular accident) ([place]) 07/03/2019  . Hypokalemia   . History of ETT   . SAH (subarachnoid hemorrhage) ([place]) 06/27/2019  . IVH (intraventricular hemorrhage) ([place]) 06/27/2019  . Cerebrovascular accident (CVA) due to embolism of posterior cerebral artery with infarctions of both occipital lobes ([place]) 06/27/2019  . 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 Aphasia 06/27/2019  . 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/26/2019  . Acute respiratory failure with hypoxemia ([place])   . Acute metabolic encephalopathy   . Essential hypertension     Arizona Nordquist 08/09/2019, 4:24 PM  [facility] [address] [place], [place], [address] Phone: [phone]   Fax:  [phone]  Name: Jacob Holder MRN: [id number] Date of Birth: 1991-05-10

## 2019-08-12 ENCOUNTER — Encounter: Payer: Self-pay | Admitting: Adult Health

## 2019-08-12 LAB — CBC WITH DIFFERENTIAL/PLATELET
Absolute Monocytes: 465 cells/uL (ref 200–950)
Basophils Absolute: 20 cells/uL (ref 0–200)
Basophils Relative: 0.4 %
Eosinophils Absolute: 300 cells/uL (ref 15–500)
Eosinophils Relative: 6 %
HCT: 35.3 % — ABNORMAL LOW (ref 38.5–50.0)
Hemoglobin: 11.3 g/dL — ABNORMAL LOW (ref 13.2–17.1)
Lymphs Abs: 2015 cells/uL (ref 850–3900)
MCH: 27.6 pg (ref 27.0–33.0)
MCHC: 32 g/dL (ref 32.0–36.0)
MCV: 86.3 fL (ref 80.0–100.0)
MPV: 13.5 fL — ABNORMAL HIGH (ref 7.5–12.5)
Monocytes Relative: 9.3 %
Neutro Abs: 2200 cells/uL (ref 1500–7800)
Neutrophils Relative %: 44 %
Platelets: 89 10*3/uL — ABNORMAL LOW (ref 140–400)
RBC: 4.09 10*6/uL — ABNORMAL LOW (ref 4.20–5.80)
RDW: 13 % (ref 11.0–15.0)
Total Lymphocyte: 40.3 %
WBC: 5 10*3/uL (ref 3.8–10.8)

## 2019-08-12 LAB — RPR TITER: RPR Titer: 1:1 {titer} — ABNORMAL HIGH

## 2019-08-12 LAB — HIV-1 GENOTYPE: HIV-1 Genotype: DETECTED — AB

## 2019-08-12 LAB — QUANTIFERON-TB GOLD PLUS
Mitogen-NIL: 9.73 IU/mL
NIL: 0.05 IU/mL
QuantiFERON-TB Gold Plus: NEGATIVE
TB1-NIL: <0 IU/mL
TB2-NIL: 0 IU/mL

## 2019-08-12 LAB — URINALYSIS
Bilirubin Urine: NEGATIVE
Glucose, UA: NEGATIVE
Hgb urine dipstick: NEGATIVE
Leukocytes,Ua: NEGATIVE
Nitrite: NEGATIVE
Specific Gravity, Urine: 1.034 (ref 1.001–1.03)
pH: 6 (ref 5.0–8.0)

## 2019-08-12 LAB — HIV-1 RNA ULTRAQUANT REFLEX TO GENTYP+
HIV 1 RNA Quant: 76200 copies/mL — ABNORMAL HIGH
HIV-1 RNA Quant, Log: 4.88 Log copies/mL — ABNORMAL HIGH

## 2019-08-12 LAB — COMPLETE METABOLIC PANEL WITH GFR
AG Ratio: 0.9 (calc) — ABNORMAL LOW (ref 1.0–2.5)
ALT: 61 U/L — ABNORMAL HIGH (ref 9–46)
AST: 37 U/L (ref 10–40)
Albumin: 4 g/dL (ref 3.6–5.1)
Alkaline phosphatase (APISO): 60 U/L (ref 36–130)
BUN: 9 mg/dL (ref 7–25)
CO2: 27 mmol/L (ref 20–32)
Calcium: 8.2 mg/dL — ABNORMAL LOW (ref 8.6–10.3)
Chloride: 107 mmol/L (ref 98–110)
Creat: 0.66 mg/dL (ref 0.60–1.35)
GFR, Est African American: 150 mL/min/{1.73_m2} (ref >=60)
GFR, Est Non African American: 130 mL/min/{1.73_m2} (ref >=60)
Globulin: 4.3 g/dL (calc) — ABNORMAL HIGH (ref 1.9–3.7)
Glucose, Bld: 92 mg/dL (ref 65–99)
Potassium: 3.7 mmol/L (ref 3.5–5.3)
Sodium: 140 mmol/L (ref 135–146)
Total Bilirubin: 0.5 mg/dL (ref 0.2–1.2)
Total Protein: 8.3 g/dL — ABNORMAL HIGH (ref 6.1–8.1)

## 2019-08-12 LAB — LIPID PANEL
Cholesterol: 90 mg/dL (ref <200)
HDL: 29 mg/dL — ABNORMAL LOW (ref >=40)
LDL Cholesterol (Calc): 47 mg/dL (calc)
Non-HDL Cholesterol (Calc): 61 mg/dL (calc) (ref <130)
Total CHOL/HDL Ratio: 3.1 (calc) (ref <5.0)
Triglycerides: 61 mg/dL (ref <150)

## 2019-08-12 LAB — HEPATITIS B CORE ANTIBODY, TOTAL: Hep B Core Total Ab: NONREACTIVE

## 2019-08-12 LAB — HIV-1/2 AB - DIFFERENTIATION
HIV-1 antibody: POSITIVE — AB
HIV-2 Ab: NEGATIVE

## 2019-08-12 LAB — HLA B*5701: HLA-B*5701 w/rflx HLA-B High: NEGATIVE

## 2019-08-12 LAB — HEPATITIS B SURFACE ANTIGEN: Hepatitis B Surface Ag: NONREACTIVE

## 2019-08-12 LAB — HEPATITIS C ANTIBODY
Hepatitis C Ab: NONREACTIVE
SIGNAL TO CUT-OFF: 0.26 (ref <1.00)

## 2019-08-12 LAB — HEPATITIS B SURFACE ANTIBODY,QUALITATIVE: Hep B S Ab: NONREACTIVE

## 2019-08-12 LAB — HEPATITIS A ANTIBODY, TOTAL: Hepatitis A AB,Total: NONREACTIVE

## 2019-08-12 LAB — HIV ANTIBODY (ROUTINE TESTING W REFLEX): HIV 1&2 Ab, 4th Generation: REACTIVE — AB

## 2019-08-12 LAB — RPR: RPR Ser Ql: REACTIVE — AB

## 2019-08-12 LAB — FLUORESCENT TREPONEMAL AB(FTA)-IGG-BLD: Fluorescent Treponemal ABS: REACTIVE — AB

## 2019-08-13 ENCOUNTER — Ambulatory Visit: Payer: Managed Care, Other (non HMO)

## 2019-08-13 NOTE — Progress Notes (Signed)
I agree with the above plan 

## 2019-08-14 ENCOUNTER — Ambulatory Visit: Payer: Managed Care, Other (non HMO) | Admitting: Occupational Therapy

## 2019-08-14 ENCOUNTER — Telehealth: Payer: Self-pay | Admitting: Pharmacy Technician

## 2019-08-14 NOTE — Telephone Encounter (Signed)
RCID Patient Product/process development scientist completed.    The patient is insured through [facility] and has a $0 copay.    Netty Starring. Dimas Aguas CPhT [facility] Patient [facility] Phone: [phone] Fax:  [phone]

## 2019-08-14 NOTE — Progress Notes (Signed)
HPI: Zaine Elsass is a 29 y.o. male who presents to the [facility] today to initiate care for newly diagnosed HIV-1 infection.  Patient Active Problem List   Diagnosis Date Noted  . HIV disease (HCC) 07/09/2019  . Normochromic normocytic anemia 07/04/2019  . Stroke (HCC) 07/04/2019  . Drug use 07/04/2019  . History of cocaine use   . Acute CVA (cerebrovascular accident) (HCC) 07/03/2019  . Hypokalemia   . History of ETT   . SAH (subarachnoid hemorrhage) (HCC) 06/27/2019  . IVH (intraventricular hemorrhage) (HCC) 06/27/2019  . 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 Aphasia 06/27/2019  . 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/26/2019  . Essential hypertension     Patient's Medications  New Prescriptions   BICTEGRAVIR-EMTRICITABINE-TENOFOVIR AF (BIKTARVY) 50-200-25 MG TABS TABLET    Take 1 tablet by mouth daily. Try to take at the same time each day with or without food.  Previous Medications   ASPIRIN 81 MG CHEWABLE TABLET    Chew 81 mg by mouth daily.   ATORVASTATIN (LIPITOR) 40 MG TABLET    Take 1 tablet (40 mg total) by mouth daily at 6 PM.   BUSPIRONE (BUSPAR) 5 MG TABLET    Take 1 tablet (5 mg total) by mouth 3 (three) times daily.   CLOPIDOGREL (PLAVIX) 75 MG TABLET    Take 75 mg by mouth daily.    IBUPROFEN (ADVIL) 200 MG TABLET    Take 600 mg by mouth every 6 (six) hours as needed for headache.   VERAPAMIL (CALAN) 80 MG TABLET    Take 1 tablet (80 mg total) by mouth 3 (three) times daily.  Modified Medications   No medications on file  Discontinued Medications   No medications on file    Allergies: No Known Allergies  Past Medical History: Past Medical History:  Diagnosis Date  . Aneurysm (HCC)   . Anxiety   . Cocaine abuse (HCC)   . Depression   . GERD (gastroesophageal reflux disease)   . Headache   . Stroke (HCC)   . Wears contact lenses      Social History: Social History   Socioeconomic History  . Marital status: Single    Spouse name: Not on file  . Number of children: 0  . Years of education: Not on file  . Highest education level: Some college, no degree  Occupational History  . Occupation: unemployed  Tobacco Use  . Smoking status: Current Every Day Smoker    Packs/day: 0.50    Types: Cigarettes  . Smokeless tobacco: Never Used  Substance and Sexual Activity  . Alcohol use: Yes    Comment: from time to time  . Drug use: No  . Sexual activity: Not Currently    Birth control/protection: Condom  Other Topics Concern  . Not on file  Social History Narrative   Patient is left-handed. He lives in a 2 level home. He drinks tea 2-3 glasses a day, and coffee occassionally.   Social Determinants of Health   Financial Resource Strain:   . Difficulty of Paying Living Expenses: Not on file  Food Insecurity:   . Worried About Programme researcher, broadcasting/film/video in the Last Year: Not on file  . Ran Out of Food in the Last Year: Not on file  Transportation Needs:   . Lack of Transportation (Medical): Not on file  . Lack of Transportation (Non-Medical): Not  on file  Physical Activity:   . Days of Exercise per Week: Not on file  . Minutes of Exercise per Session: Not on file  Stress:   . Feeling of Stress : Not on file  Social Connections:   . Frequency of Communication with Friends and Family: Not on file  . Frequency of Social Gatherings with Friends and Family: Not on file  . Attends Religious Services: Not on file  . Active Member of Clubs or Organizations: Not on file  . Attends Archivist Meetings: Not on file  . Marital Status: Not on file    Labs: Lab Results  Component Value Date   HIV1RNAQUANT 76,200 (H) 07/31/2019   CD4TABS 478 07/31/2019    RPR and STI Lab Results  Component Value Date   LABRPR REACTIVE (A) 07/31/2019   RPRTITER 1:1 (H) 07/31/2019    STI Results GC CT  07/31/2019 Negative  Negative    Hepatitis B Lab Results  Component Value Date   HEPBSAB NON-REACTIVE 07/31/2019   HEPBSAG NON-REACTIVE 07/31/2019   HEPBCAB NON-REACTIVE 07/31/2019   Hepatitis C Lab Results  Component Value Date   HEPCAB NON-REACTIVE 07/31/2019   Hepatitis A Lab Results  Component Value Date   HAV NON-REACTIVE 07/31/2019   Lipids: Lab Results  Component Value Date   CHOL 90 07/31/2019   TRIG 61 07/31/2019   HDL 29 (L) 07/31/2019   CHOLHDL 3.1 07/31/2019   VLDL 36 06/28/2019   LDLCALC 47 07/31/2019    Current HIV Regimen: Treatment naive  Assessment: Dominque is here today to initiate care with Janene Madeira, NP and Cassie for newly diagnosed HIV-1 infection. Last negative HIV test was in 2019. He has only had 1 male partner in the last 4 years and was unaware he was HIV+ until a recent admission. He is treatment naive with an initial HIV-1 viral load of 76,200 and a CD4 count of 478.  No resistance mutations found on initial genotype. Will start patient on [place].  Dominque has a history of struggling with depression and anxiety, some of this in relation to newly diagnosed HIV. However, he is ready to start taking Biktarvy today. He was educated on taking Biktarvy around the same time every day. He was told that Phillips Odor is the most tolerated HIV medication, however he may experience GI upset and headache. Explained that eating a meal with Bikjtarvy may help with GI upset. We told Owens that Phillips Odor does not interact with any of his current medications and he can take it with the other medications he takes every morning.  Waleed did not have any questions or concerns at this time.  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

## 2019-08-15 ENCOUNTER — Ambulatory Visit (INDEPENDENT_AMBULATORY_CARE_PROVIDER_SITE_OTHER): Payer: Managed Care, Other (non HMO) | Admitting: Pharmacist

## 2019-08-15 ENCOUNTER — Other Ambulatory Visit: Payer: Self-pay

## 2019-08-15 ENCOUNTER — Encounter: Payer: Self-pay | Admitting: Infectious Diseases

## 2019-08-15 ENCOUNTER — Ambulatory Visit (INDEPENDENT_AMBULATORY_CARE_PROVIDER_SITE_OTHER): Payer: Managed Care, Other (non HMO) | Admitting: Infectious Diseases

## 2019-08-15 DIAGNOSIS — B20 Human immunodeficiency virus [HIV] disease: Secondary | ICD-10-CM

## 2019-08-15 DIAGNOSIS — I639 Cerebral infarction, unspecified: Secondary | ICD-10-CM

## 2019-08-15 MED ORDER — BICTEGRAVIR-EMTRICITAB-TENOFOV 50-200-25 MG PO TABS: 1.0000 | ORAL_TABLET | Freq: Every day | ORAL | 5 refills | Status: DC

## 2019-08-15 NOTE — Assessment & Plan Note (Signed)
Continues with rehab team and feels overall improved. Maintained on Plavix + ASA chronically.  In care with PCP Counseled to continue with avoiding cocaine and other substances including alcohol.

## 2019-08-15 NOTE — Patient Instructions (Addendum)
It was a pleasure to meet you today and I look forward to working with you.   Please call [facility] to help with counseling.  I also think you may need   Biktarvy is the pill I would like for you to start taking to treat you - this will need to be taken once a day around the same time.  - Common side effects for a short time frame usually include headaches, nausea and diarrhea - OK to take over the counter tylenol for headaches and imodium for diarrhea - Try taking with food if you are nauseated  - If you take any multivitamins or supplements please separate them from your Biktarvy by 6 hours before and after.  The main thing is do not have them in the stomach at the same time.  Recommended Next Office Visit:   4 weeks with Cassie our pharmacy team

## 2019-08-15 NOTE — Progress Notes (Signed)
Name: Jacob Holder  DOB: 04/20/1991 MRN: [id number] PCP: Benito Mccreedy, MD    Patient Active Problem List   Diagnosis Date Noted  . HIV disease ([place]) 07/09/2019  . Normochromic normocytic anemia 07/04/2019  . Stroke ([place]) 07/04/2019  . Drug use 07/04/2019  . History of cocaine use   . Acute CVA (cerebrovascular accident) ([place]) 07/03/2019  . Hypokalemia   . History of ETT   . SAH (subarachnoid hemorrhage) ([place]) 06/27/2019  . IVH (intraventricular hemorrhage) ([place]) 06/27/2019  . Cerebrovascular accident (CVA) due to embolism of posterior cerebral artery with infarctions of both occipital lobes ([place]) 06/27/2019  . 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 Aphasia 06/27/2019  . 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/26/2019  . Essential hypertension      Brief Narrative:  Jacob Holder is a 29 y.o. male with HIV Dx recently diagnosed recently 07/2019 during hospital screening with stroke.    CD4 nadir 478 VL 76,200 HIV Risk: MSM History of OIs: none Intake Labs 07/2019: Hep B sAg (-), sAb (-), cAb (-); Hep A (-), Hep C (-) Quantiferon (-) HLA B*5701 (-) G6PD: ()   Previous Regimens: . naive  Genotypes: . 07/2019 - wildtype  Subjective:  CC: New Patient visit - HIV entry to care.  Depression/Anxiety    HPI: Jacob Holder is a 29 y.o. male here for his entry to care visit to treat HIV disease. He was last screened for HIV in 2019 and non-reactive at that time. Has had only 19 male partner the last 4 years and unaware he was HIV+ until this recent admission. He feels very overwhelmed with everything and is very ready to start on medications as soon as possible. He has had trouble with depression and anxiety since his recent admission but depression pre-dates this. Started on buspar TID but feels this does not help his depression enough. Has been to counseling in the past but was not open to it then but would be now.    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 MRI following intervention revealed parietal and cerebellar and occipital strokesWas recommended SNF at discharge but declined. Came back to the hospital with memory deficit, numbness and tingling in hands. He has continued to follow up with neurology and rehab team and reports his memory and vision are improving.  He is to chronically maintain Plavix and ASA 17m daily. He does not miss his medication and takes it every morning when he wakes up.   Receives annual preventative and chronic disease care through his PCP and is up to date on all recommended screenings and vaccinations. He smokes cigarettes 2-5 a day now (down from 1.5 packs a day). Used to use cocaine however after his surgery in December he has abstained completely and has no plans to use again.   Not currently sexually active but prefers male partners. Screened recently with 3-point swabs for gonorrhea and chlamydia and all negative. Treated for syphilis as a freshman in college.    Review of Systems  Constitutional: Negative for chills, fever, malaise/fatigue and weight loss.  HENT:  Negative for sore throat.   Respiratory: Negative for cough, sputum production and shortness of breath.   Cardiovascular: Negative.   Gastrointestinal: Negative for abdominal pain, diarrhea and vomiting.  Musculoskeletal: Negative for joint pain, myalgias and neck pain.  Skin: Negative for rash.  Neurological: Negative for headaches.  Psychiatric/Behavioral: Negative for depression and substance abuse. The patient is not nervous/anxious.     Past Medical  History:  Diagnosis Date  . Aneurysm ([place])   . Anxiety   . Cocaine abuse ([place])   . Depression   . GERD (gastroesophageal reflux disease)   . Headache   . Stroke ([place])   . Wears contact lenses     Outpatient Medications Prior to Visit  Medication Sig Dispense Refill  . aspirin 81 MG chewable tablet Chew 81 mg by mouth daily.    Marland Kitchen atorvastatin (LIPITOR) 40 MG tablet Take 1 tablet (40 mg total) by mouth daily at 6 PM. 30 tablet 0  . busPIRone (BUSPAR) 5 MG tablet Take 1 tablet (5 mg total) by mouth 3 (three) times daily. 90 tablet 2  . clopidogrel (PLAVIX) 75 MG tablet Take 75 mg by mouth daily.     Marland Kitchen ibuprofen (ADVIL) 200 MG tablet Take 600 mg by mouth every 6 (six) hours as needed for headache.    . verapamil (CALAN) 80 MG tablet Take 1 tablet (80 mg total) by mouth 3 (three) times daily. 90 tablet 3   No facility-administered medications prior to visit.     No Known Allergies  Social History   Tobacco Use  . Smoking status: Current Every Day Smoker    Packs/day: 0.50    Types: Cigarettes  . Smokeless tobacco: Never Used  Substance Use Topics  . Alcohol use: Yes    Comment: from time to time  . Drug use: No    Family History  Problem Relation Age of Onset  . Healthy Mother   . Thyroid disease Mother   . Diabetes Father     Social History   Substance and Sexual Activity  Sexual Activity Not Currently  . Birth control/protection: Condom     Objective:   Vitals:   08/15/19 1008  BP: 110/69  Pulse: 84  Temp: 97.8 F (36.6 C)  SpO2: 100%  Weight: 163 lb (73.9 kg)  Height: '5\' 11"'$  (1.803 m)   Body mass index is 22.73 kg/m.  Physical Exam Vitals and nursing note reviewed.  Constitutional:      Appearance: He is well-developed.     Comments: Seated comfortably in chair during visit.   HENT:     Mouth/Throat:     Dentition: Normal dentition. No dental abscesses.  Cardiovascular:     Rate and Rhythm: Normal rate and regular rhythm.     Heart sounds:  Normal heart sounds.  Pulmonary:     Effort: Pulmonary effort is normal.     Breath sounds: Normal breath sounds.  Abdominal:     General: There is no distension.     Palpations: Abdomen is soft.     Tenderness: There is no abdominal tenderness.  Lymphadenopathy:     Cervical: No cervical adenopathy.  Skin:    General: Skin is warm and dry.     Findings: No rash.  Neurological:     Mental Status: He is alert and oriented to person, place, and time.  Psychiatric:        Judgment: Judgment normal.     Comments: In good spirits today and engaged  in care discussion.      Lab Results Lab Results  Component Value Date   WBC 5.0 07/31/2019   HGB 11.3 (L) 07/31/2019   HCT 35.3 (L) 07/31/2019   MCV 86.3 07/31/2019   PLT 89 (L) 07/31/2019    Lab Results  Component Value Date   CREATININE 0.66 07/31/2019   BUN 9 07/31/2019   NA 140 07/31/2019   K 3.7 07/31/2019   CL 107 07/31/2019   CO2 27 07/31/2019    Lab Results  Component Value Date   ALT 61 (H) 07/31/2019   AST 37 07/31/2019   ALKPHOS 57 07/03/2019   BILITOT 0.5 07/31/2019    Lab Results  Component Value Date   CHOL 90 07/31/2019   HDL 29 (L) 07/31/2019   LDLCALC 47 07/31/2019   TRIG 61 07/31/2019   CHOLHDL 3.1 07/31/2019   HIV 1 RNA Quant (copies/mL)  Date Value  07/31/2019 76,200 (H)   CD4 T Cell Abs (/uL)  Date Value  07/31/2019 478     Assessment & Plan:   Problem List Items Addressed This Visit      Unprioritized   Stroke [facility])    Continues with rehab team and feels overall improved. Maintained on Plavix + ASA chronically.  In care with PCP Counseled to continue with avoiding cocaine and other substances including alcohol.       HIV disease ([place]) (Chronic)    New patient here to establish for HIV care. Treatment naive with wildtype virus on genotype. AIDS (-). Asymptomatic from his description, last negative HIV test 2019.   I discussed with Alaric Deery treatment options/side effects,  benefits of treatment and long-term outcomes. I discussed how HIV is transmitted and the process of untreated HIV including increased risk for opportunistic infections, cancer, dementia and renal failure. Patient was counseled on routine HIV care including medication adherence, blood monitoring, necessary vaccines and follow up visits. Counseled regarding safe sex practices including: condom use, partner disclosure, limiting partners. Spent time discussing medication use/side effects with Cassie, PharmD. He will get medications from [facility] and begin [place] as soon as possible. He has Pharmacist, community through [facility].   General introduction to our clinic and integrated services. He is currently working with [facility] and finds them helpful. Agreeable to setting up counseling with [facility] - Marcie Bal is not her today for warm hand off but information regarding referral provided.   I spent greater than 45 minutes with the patient today. Greater than 50% of the time spent face-to-face counseling and coordination of care re: HIV and health maintenance.        Relevant Medications   bictegravir-emtricitabine-tenofovir AF (BIKTARVY) 50-200-25 MG TABS tablet     RTC in 4 weeks with pharmacy team.  He declined flu shot today. Would like to start pneumonia vaccines at upcoming office visit.   Will also need Hep B and A vaccines.    Janene Madeira, MSN, NP-C [facility] Pager: [phone] Office: [phone]  08/15/19  12:16 PM

## 2019-08-15 NOTE — Assessment & Plan Note (Signed)
New patient here to establish for HIV care. Treatment naive with wildtype virus on genotype. AIDS (-). Asymptomatic from his description, last negative HIV test 2019.   I discussed with Bennet Gatchel treatment options/side effects, benefits of treatment and long-term outcomes. I discussed how HIV is transmitted and the process of untreated HIV including increased risk for opportunistic infections, cancer, dementia and renal failure. Patient was counseled on routine HIV care including medication adherence, blood monitoring, necessary vaccines and follow up visits. Counseled regarding safe sex practices including: condom use, partner disclosure, limiting partners. Spent time discussing medication use/side effects with Cassie, PharmD. He will get medications from [facility] outpatient pharmacy and begin [place] as soon as possible. He has Nurse, learning disability through [facility].   General introduction to our clinic and integrated services. He is currently working with [facility] and finds them helpful. Agreeable to setting up counseling with [facility] - Marylu Lund is not her today for warm hand off but information regarding referral provided.   I spent greater than 45 minutes with the patient today. Greater than 50% of the time spent face-to-face counseling and coordination of care re: HIV and health maintenance.

## 2019-08-19 MED FILL — BIKTARVY 50-200-25 MG TABS: 50-200-25 | 30 days supply | Qty: 30 | Fill #0

## 2019-08-20 ENCOUNTER — Encounter: Payer: Self-pay | Admitting: *Deleted

## 2019-08-20 ENCOUNTER — Telehealth: Payer: Self-pay | Admitting: Adult Health

## 2019-08-20 ENCOUNTER — Ambulatory Visit: Payer: Managed Care, Other (non HMO)

## 2019-08-20 ENCOUNTER — Ambulatory Visit: Payer: Managed Care, Other (non HMO) | Admitting: Occupational Therapy

## 2019-08-20 NOTE — Telephone Encounter (Signed)
Letter signed and emailed to pt as attachment to [email or web address].  Pt aware.

## 2019-08-20 NOTE — Telephone Encounter (Signed)
He can return to work at this time but is not cleared to return to driving until he is evaluated further by ophthalmology

## 2019-08-20 NOTE — Telephone Encounter (Signed)
Pt called stating that he is needing a letter for his work stating that he is released to go back. Pt states he asked his PCP and they informed him that he needed to get the letter from his Neurologist. Please advise.

## 2019-08-20 NOTE — Telephone Encounter (Signed)
I called pt about letter needed for going back to work.  He was at pcp today they said for [place] to make decision.  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  Can work for home.  Will still go to appts as needed (he had spoken to supervisor).  Eye exam, [facility] to be scheduled. Please advise.  Will email letter of ok.

## 2019-08-27 ENCOUNTER — Telehealth: Payer: Self-pay | Admitting: Occupational Therapy

## 2019-08-27 NOTE — Telephone Encounter (Signed)
Therapist left pt a message reminding him of his appointments tomorrow. Pt was made aware that if he now shows tomorrow without calling his remaining appointments will be cancelled as he has missed his last 2 scheduled appointments.

## 2019-08-28 ENCOUNTER — Ambulatory Visit: Payer: Managed Care, Other (non HMO)

## 2019-08-28 ENCOUNTER — Ambulatory Visit: Payer: Managed Care, Other (non HMO) | Admitting: Occupational Therapy

## 2019-08-28 NOTE — Therapy (Signed)
[facility] [address] [place], [place], [address] Phone: [phone]   Fax:  [phone]  Patient Details  Name: Jacob Holder MRN: [id number] Date of Birth: 07-30-1990 Referring Provider: Ihor Austin, NP (ST)  Encounter Date: 08/28/2019  Pt no-showed therapy visits this morning and thus the remainder of pt's therapy appointments were cancelled.  We will be happy to see pt for OT and ST in the future with a new script.   Thank you.   [facility] ,MS, CCC-SLP  08/28/2019, 8:21 AM  [facility] [address] [place], [place], [address] Phone: [phone]   Fax:  [phone]

## 2019-09-03 ENCOUNTER — Ambulatory Visit: Payer: Managed Care, Other (non HMO)

## 2019-09-03 ENCOUNTER — Ambulatory Visit: Payer: Managed Care, Other (non HMO) | Admitting: Occupational Therapy

## 2019-09-12 ENCOUNTER — Ambulatory Visit: Payer: Managed Care, Other (non HMO) | Admitting: Pharmacist

## 2019-09-23 MED FILL — BIKTARVY 50-200-25 MG TABS: 50-200-25 | 30 days supply | Qty: 30 | Fill #1

## 2019-10-18 MED FILL — BIKTARVY 50-200-25 MG TABS: 50-200-25 | 30 days supply | Qty: 30 | Fill #2

## 2019-11-06 ENCOUNTER — Other Ambulatory Visit: Payer: Self-pay

## 2019-11-06 ENCOUNTER — Ambulatory Visit (INDEPENDENT_AMBULATORY_CARE_PROVIDER_SITE_OTHER): Payer: Managed Care, Other (non HMO) | Admitting: Pharmacist

## 2019-11-06 DIAGNOSIS — Z79899 Other long term (current) drug therapy: Secondary | ICD-10-CM | POA: Diagnosis not present

## 2019-11-06 DIAGNOSIS — B20 Human immunodeficiency virus [HIV] disease: Secondary | ICD-10-CM

## 2019-11-06 DIAGNOSIS — Z23 Encounter for immunization: Secondary | ICD-10-CM | POA: Diagnosis not present

## 2019-11-06 NOTE — Patient Instructions (Signed)
Great to see you! Keep doing a good job with your medication. See you in 2 months!

## 2019-11-06 NOTE — Progress Notes (Signed)
HPI: Jacob Holder is a 29 y.o. male who presents to the [facility] pharmacy clinic for HIV follow-up.  Patient Active Problem List   Diagnosis Date Noted  . HIV disease (HCC) 07/09/2019  . Normochromic normocytic anemia 07/04/2019  . Stroke (HCC) 07/04/2019  . Drug use 07/04/2019  . History of cocaine use   . Acute CVA (cerebrovascular accident) (HCC) 07/03/2019  . Hypokalemia   . History of ETT   . SAH (subarachnoid hemorrhage) (HCC) 06/27/2019  . IVH (intraventricular hemorrhage) (HCC) 06/27/2019  . 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 Aphasia 06/27/2019  . 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/26/2019  . Essential hypertension     Patient's Medications  New Prescriptions   No medications on file  Previous Medications   ASPIRIN 81 MG CHEWABLE TABLET    Chew 81 mg by mouth daily.   ATORVASTATIN (LIPITOR) 40 MG TABLET    Take 1 tablet (40 mg total) by mouth daily at 6 PM.   BICTEGRAVIR-EMTRICITABINE-TENOFOVIR AF (BIKTARVY) 50-200-25 MG TABS TABLET    Take 1 tablet by mouth daily. Try to take at the same time each day with or without food.   BUSPIRONE (BUSPAR) 5 MG TABLET    Take 1 tablet (5 mg total) by mouth 3 (three) times daily.   CLOPIDOGREL (PLAVIX) 75 MG TABLET    Take 75 mg by mouth daily.    IBUPROFEN (ADVIL) 200 MG TABLET    Take 600 mg by mouth every 6 (six) hours as needed for headache.   VERAPAMIL (CALAN) 80 MG TABLET    Take 1 tablet (80 mg total) by mouth 3 (three) times daily.  Modified Medications   No medications on file  Discontinued Medications   No medications on file    Allergies: No Known Allergies  Past Medical History: Past Medical History:  Diagnosis Date  . Aneurysm (HCC)   . Anxiety   . Cocaine abuse (HCC)   . Depression   . GERD (gastroesophageal reflux disease)   . Headache   . Stroke (HCC)   . Wears contact lenses      Social History: Social History   Socioeconomic History  . Marital status: Single    Spouse name: Not on file  . Number of children: 0  . Years of education: Not on file  . Highest education level: Some college, no degree  Occupational History  . Occupation: unemployed  Tobacco Use  . Smoking status: Current Every Day Smoker    Packs/day: 0.50    Types: Cigarettes  . Smokeless tobacco: Never Used  Substance and Sexual Activity  . Alcohol use: Yes    Comment: from time to time  . Drug use: No  . Sexual activity: Not Currently    Birth control/protection: Condom  Other Topics Concern  . Not on file  Social History Narrative   Patient is left-handed. He lives in a 2 level home. He drinks tea 2-3 glasses a day, and coffee occassionally.   Social Determinants of Health   Financial Resource Strain:   . Difficulty of Paying Living Expenses:   Food Insecurity:   . Worried About Programme researcher, broadcasting/film/video in the Last Year:   . Barista in the Last Year:   Transportation Needs:   . Freight forwarder (Medical):   Marland Kitchen Lack of Transportation (Non-Medical):   Physical Activity:   . Days  of Exercise per Week:   . Minutes of Exercise per Session:   Stress:   . Feeling of Stress :   Social Connections:   . Frequency of Communication with Friends and Family:   . Frequency of Social Gatherings with Friends and Family:   . Attends Religious Services:   . Active Member of Clubs or Organizations:   . Attends Archivist Meetings:   Marland Kitchen Marital Status:     Labs: Lab Results  Component Value Date   HIV1RNAQUANT 76,200 (H) 07/31/2019   CD4TABS 478 07/31/2019    RPR and STI Lab Results  Component Value Date   LABRPR REACTIVE (A) 07/31/2019   RPRTITER 1:1 (H) 07/31/2019    STI Results GC CT  07/31/2019 Negative Negative    Hepatitis B Lab Results  Component Value Date   HEPBSAB NON-REACTIVE 07/31/2019   HEPBSAG NON-REACTIVE 07/31/2019   HEPBCAB  NON-REACTIVE 07/31/2019   Hepatitis C Lab Results  Component Value Date   HEPCAB NON-REACTIVE 07/31/2019   Hepatitis A Lab Results  Component Value Date   HAV NON-REACTIVE 07/31/2019   Lipids: Lab Results  Component Value Date   CHOL 90 07/31/2019   TRIG 61 07/31/2019   HDL 29 (L) 07/31/2019   CHOLHDL 3.1 07/31/2019   VLDL 36 06/28/2019   LDLCALC 47 07/31/2019    Current HIV Regimen: Biktarvy  Assessment: Jacob Holder is here today for HIV follow up.  We initially saw him back in February as a newly diagnosed HIV patient. He missed his follow up appointment with me in March.  Today, he tells me that he is doing better than he was when he initially saw [place]. He states that he had a hard time with the diagnosis but has talked with several friends who are also HIV positive and he feels much better about the diagnosis now.  He is interested in our counseling services, so I will have him stop by the front desk to make an appointment with Jacob Holder before he leaves today.  He has been taking his Biktarvy every day since his diagnosis. He does state that he has missed about 2 doses but is trying to be better about taking it every day without missing any doses at all.  He tolerates it well without any noted side effects.  No issues getting it from the pharmacy either. I congratulated his adherence and encouraged him to continue doing well and try not to miss any doses at all going forward.   He needs his Hepatitis A, B, and Pneumonia vaccines.  He denied Hep A and B but was amendable to getting Prevnar today.  I told him that he will get a 2nd pneumonia vaccine when he sees Jacob Holder in 8 weeks. He was fine with that.  Will check labs today and schedule him with Jacob Holder. He knows to call me with any issues.  Plan: - Continue Biktarvy PO once daily - HIV viral load, CD4, BMET today - Prevnar 13 today - F/u with Jacob Holder 5/11 at 1030am - F/u with Jacob Holder 7/6 at 1030am  Somaya Grassi L. Deane Melick,  PharmD, BCIDP, AAHIVP, CPP Clinical Pharmacist Practitioner [facility] 11/07/2019, 11:48 AM

## 2019-11-07 LAB — T-HELPER CELL (CD4) - (RCID CLINIC ONLY)
CD4 % Helper T Cell: 27 % — ABNORMAL LOW (ref 33–65)
CD4 T Cell Abs: 574 /uL (ref 400–1790)

## 2019-11-08 LAB — HIV-1 RNA QUANT-NO REFLEX-BLD
HIV 1 RNA Quant: 36 copies/mL — ABNORMAL HIGH
HIV-1 RNA Quant, Log: 1.56 Log copies/mL — ABNORMAL HIGH

## 2019-11-08 LAB — COMPREHENSIVE METABOLIC PANEL
AG Ratio: 0.9 (calc) — ABNORMAL LOW (ref 1.0–2.5)
ALT: 16 U/L (ref 9–46)
AST: 17 U/L (ref 10–40)
Albumin: 4.1 g/dL (ref 3.6–5.1)
Alkaline phosphatase (APISO): 52 U/L (ref 36–130)
BUN: 7 mg/dL (ref 7–25)
CO2: 26 mmol/L (ref 20–32)
Calcium: 9.2 mg/dL (ref 8.6–10.3)
Chloride: 103 mmol/L (ref 98–110)
Creat: 0.9 mg/dL (ref 0.60–1.35)
Globulin: 4.5 g/dL (calc) — ABNORMAL HIGH (ref 1.9–3.7)
Glucose, Bld: 83 mg/dL (ref 65–99)
Potassium: 4.1 mmol/L (ref 3.5–5.3)
Sodium: 137 mmol/L (ref 135–146)
Total Bilirubin: 0.4 mg/dL (ref 0.2–1.2)
Total Protein: 8.6 g/dL — ABNORMAL HIGH (ref 6.1–8.1)

## 2019-11-11 ENCOUNTER — Encounter: Payer: Self-pay | Admitting: Pharmacist

## 2019-11-11 ENCOUNTER — Ambulatory Visit (INDEPENDENT_AMBULATORY_CARE_PROVIDER_SITE_OTHER): Payer: Managed Care, Other (non HMO) | Admitting: Adult Health

## 2019-11-11 ENCOUNTER — Other Ambulatory Visit: Payer: Self-pay

## 2019-11-11 ENCOUNTER — Telehealth: Payer: Self-pay | Admitting: Pharmacist

## 2019-11-11 ENCOUNTER — Encounter: Payer: Self-pay | Admitting: Adult Health

## 2019-11-11 VITALS — BP 123/78 | HR 78 | Temp 97.8°F | Ht 71.0 in | Wt 164.0 lb

## 2019-11-11 DIAGNOSIS — E785 Hyperlipidemia, unspecified: Secondary | ICD-10-CM

## 2019-11-11 DIAGNOSIS — I69319 Unspecified symptoms and signs involving cognitive functions following cerebral infarction: Secondary | ICD-10-CM

## 2019-11-11 DIAGNOSIS — I69398 Other sequelae of cerebral infarction: Secondary | ICD-10-CM

## 2019-11-11 DIAGNOSIS — F331 Major depressive disorder, recurrent, moderate: Secondary | ICD-10-CM | POA: Diagnosis not present

## 2019-11-11 DIAGNOSIS — I63433 Cerebral infarction due to embolism of bilateral posterior cerebral arteries: Secondary | ICD-10-CM | POA: Diagnosis not present

## 2019-11-11 DIAGNOSIS — H539 Unspecified visual disturbance: Secondary | ICD-10-CM

## 2019-11-11 DIAGNOSIS — I10 Essential (primary) hypertension: Secondary | ICD-10-CM

## 2019-11-11 NOTE — Progress Notes (Signed)
[facility] [address] [place]. [place] [address] [phone]       STROKE FOLLOW UP NOTE  [name] Date of Birth:  1990/07/23 Medical Record Number:  [id number]   Reason for Referral: stroke follow up    CHIEF COMPLAINT:  Chief Complaint  Patient presents with  . Follow-up    rm 9 - stroke f/u - visual and cognitive impairment     HPI:   Today, 11/11/2019, Jacob Holder returns for stroke follow-up.  Residual deficits memory and visual impairment.  Previously participating in outpatient therapies but unfortunately had difficulty with follow-up compliance due to being unable to get time off work.  Denies new or worsening stroke/TIA symptoms.  Continues on aspirin and Plavix without bleeding or bruising.  Continues on atorvastatin without myalgias.  Blood pressure today 123/78.  Follow-up with Dr. Corliss Skains next month with repeat imaging for prior intervention of basilar aneurysm.  Underlying depression/anxiety worsened post stroke and referred to behavioral health but unfortunately has not been able to schedule evaluation due to work schedule.  Previously on BuSpar but self discontinued due to lack of benefit.  He does endorse slight improvement especially after excepting HIV diagnosis -continue to follow closely with ID.  No further concerns at this time.     History provided for reference purposes only Initial visit 08/08/2019 JM: Jacob Holder is a 29 year old male who is being seen today for hospital follow-up.  Residual deficits include subjective memory concerns, difficulty with vision stating difficulty with focusing and mildly blurred vision but denies peripheral visual field loss or diplopia.  He continues to participate with OT with ongoing improvement.  He has not been evaluated by ophthalmology at this time.  He does endorse improvement as he initially had difficulty with walking into objects but this has since improved as well as improvement of  headaches.  He does endorse great difficulty with depression/anxiety currently on BuSpar which was initiated during hospitalization with mild benefit.  He does have prior history of depression/anxiety with suicide ideation.  He denies suicidal ideation at this time.  He does not have established psychologist/psychiatrist at this time but is interested in establishing care.  He does endorse ongoing compliance with aspirin and Plavix without bleeding or bruising.  Ongoing compliance with atorvastatin without myalgias.  Blood pressure today 122/64.  Ongoing tobacco use currently smoking approximately one fourth of a pack where prior 1 pack daily.  He denies continued substance abuse with THC or cocaine stating "I learned my lesson" in regards to increased risk with ongoing use.  Denies new or worsening stroke/TIA symptoms.  Stroke admission 06/26/2019: Jacob Holder is a 28 y.o. 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 Corliss Skains who developed loss of vision, agitation and confusion post procedure.  MRI revealed bilateral parietal, left occipital and right cerebellar infarcts along with small amount of basilar cistern subarachnoid hemorrhage.  Evaluated by stroke team and Dr. Pearlean Brownie for scattered bilateral embolic infarcts with SAH and IVH post BA aneurysm repair in setting of cocaine use.  CTA head/neck showed stent from mid basilar artery into the left P2 segment.  CT head showed left basilar cistern SAH stable with trace IVH and repeat CT head decreasing intraventricular blood products.  2D echo showed an EF of 60 to 65% without cardiac source of embolus identified.  Recommended DAPT on discharge due to stent and ongoing management by Dr. Corliss Skains.  History of HTN currently on verapamil 80 mg 3 times daily stable during admission.  He was also previously started on verapamil due to diagnosis of cluster  headaches.  LDL 73 and A1c 5.5.  UDS positive for cocaine with discussion regarding importance of cessation.  He was noted to have cocaine withdrawal-induced agitation during admission.  Current tobacco use with smoking cessation counseling provided.  Other stroke risk factors include EtOH use and advise no more than 2 drinks daily and recent diagnosis of HIV.  Other active problems include anxiety/depression, GERD and hypokalemia.  Recommended participation with outpatient therapies but unfortunately patient left AMA on 06/30/2019. He returned to ED on 07/02/2019 with complaints of memory loss and headache.  He underwent CT head which were unremarkable and left without being seen. He returned on 07/03/2019 due to decreased memory, numbness/tingling in hands bilaterally, headaches and "not feeling right".  Evaluated by stroke team and Dr. Erlinda Hong with stroke work-up revealing new left cerebellar infarct as evidenced on MRI new from 12/24 in setting of post BA embolization/stenting infarcts noncompliant with medications after discharge.  Advised DAPT and ensure compliance for secondary stroke prevention.  Also advised to restart atorvastatin 40 mg daily with prior LDL 73 for secondary stroke prevention.  Repeat UDS negative.  He was discharged home in stable condition with recommendation of follow-up with Dr. Estanislado Pandy outpatient for monitoring management.    ROS:   14 system review of systems performed and negative with exception of depression, anxiety, memory loss, blurred vision  PMH:  Past Medical History:  Diagnosis Date  . Aneurysm ([place])   . Anxiety   . Cocaine abuse ([place])   . Depression   . GERD (gastroesophageal reflux disease)   . Headache   . Stroke ([place])   . Wears contact lenses     PSH:  Past Surgical History:  Procedure Laterality Date  . IR ANGIO INTRA EXTRACRAN SEL COM CAROTID INNOMINATE BILAT MOD SED  03/05/2019  . IR ANGIO VERTEBRAL SEL VERTEBRAL BILAT MOD SED  03/05/2019  . IR  ANGIO VERTEBRAL SEL VERTEBRAL UNI R MOD SED  06/26/2019  . IR ANGIOGRAM FOLLOW UP STUDY  06/26/2019  . IR NEURO EACH ADD'L AFTER BASIC UNI RIGHT (MS)  06/26/2019  . IR TRANSCATH/EMBOLIZ  06/26/2019  . RADIOLOGY WITH ANESTHESIA N/A 06/26/2019   Procedure: EMBOLIZATION;  Surgeon: Luanne Bras, MD;  Location: [facility];  Service: Radiology;  Laterality: N/A;  . RECTAL SURGERY    . TOOTH EXTRACTION      Social History:  Social History   Socioeconomic History  . Marital status: Single    Spouse name: Not on file  . Number of children: 0  . Years of education: Not on file  . Highest education level: Some college, no degree  Occupational History  . Occupation: unemployed  Tobacco Use  . Smoking status: Current Every Day Smoker    Packs/day: 0.50    Types: Cigarettes  . Smokeless tobacco: Never Used  Substance and Sexual Activity  . Alcohol use: Yes    Comment: from time to time  . Drug use: No  . Sexual activity: Not Currently    Birth control/protection: Condom  Other Topics Concern  . Not on file  Social History Narrative   Patient is left-handed. He lives in a 2 level home. He drinks tea 2-3 glasses a day, and coffee occassionally.   Social Determinants of Health   Financial Resource Strain:   . Difficulty of Paying Living Expenses:  Food Insecurity:   . Worried About Programme researcher, broadcasting/film/video in the Last Year:   . Barista in the Last Year:   Transportation Needs:   . Freight forwarder (Medical):   Marland Kitchen Lack of Transportation (Non-Medical):   Physical Activity:   . Days of Exercise per Week:   . Minutes of Exercise per Session:   Stress:   . Feeling of Stress :   Social Connections:   . Frequency of Communication with Friends and Family:   . Frequency of Social Gatherings with Friends and Family:   . Attends Religious Services:   . Active Member of Clubs or Organizations:   . Attends Banker Meetings:   Marland Kitchen Marital Status:   Intimate Partner  Violence:   . Fear of Current or Ex-Partner:   . Emotionally Abused:   Marland Kitchen Physically Abused:   . Sexually Abused:     Family History:  Family History  Problem Relation Age of Onset  . Healthy Mother   . Thyroid disease Mother   . Diabetes Father     Medications:   Current Outpatient Medications on File Prior to Visit  Medication Sig Dispense Refill  . aspirin 81 MG chewable tablet Chew 81 mg by mouth daily.    Marland Kitchen atorvastatin (LIPITOR) 40 MG tablet Take 1 tablet (40 mg total) by mouth daily at 6 PM. 30 tablet 0  . bictegravir-emtricitabine-tenofovir AF (BIKTARVY) 50-200-25 MG TABS tablet Take 1 tablet by mouth daily. Try to take at the same time each day with or without food. 30 tablet 5  . clopidogrel (PLAVIX) 75 MG tablet Take 75 mg by mouth daily.     Marland Kitchen ibuprofen (ADVIL) 200 MG tablet Take 600 mg by mouth every 6 (six) hours as needed for headache.    . verapamil (CALAN) 80 MG tablet Take 1 tablet (80 mg total) by mouth 3 (three) times daily. 90 tablet 3   No current facility-administered medications on file prior to visit.    Allergies:  No Known Allergies   Physical Exam  Vitals:   11/11/19 1342  BP: 123/78  Pulse: 78  Temp: 97.8 F (36.6 C)  Weight: 164 lb (74.4 kg)  Height: 5\' 11"  (1.803 m)   Body mass index is 22.87 kg/m. No exam data present  General: well developed, well nourished,  pleasant young African-American male, seated, in no evident distress Head: head normocephalic and atraumatic.   Neck: supple with no carotid or supraclavicular bruits Cardiovascular: regular rate and rhythm, no murmurs Musculoskeletal: no deformity Skin:  no rash/petichiae Vascular:  Normal pulses all extremities   Neurologic Exam Mental Status: Awake and fully alert. Normal speech and language. Oriented to place and time. Recent and remote memory intact. Attention span, concentration and fund of knowledge appropriate. Mood and affect appropriate.  Cranial Nerves:  Fundoscopic exam reveals sharp disc margins. Pupils equal, briskly reactive to light. Extraocular movements full without nystagmus. Visual fields full to confrontation with subjective blurred vision greater right superior homonymous quadrantanopia. Hearing intact. Facial sensation intact. Face, tongue, palate moves normally and symmetrically.  Motor: Normal bulk and tone. Normal strength in all tested extremity muscles. Sensory.: intact to touch , pinprick , position and vibratory sensation.  Coordination: Rapid alternating movements normal in all extremities. Finger-to-nose and heel-to-shin performed accurately bilaterally. Gait and Station: Arises from chair without difficulty. Stance is normal. Gait demonstrates normal stride length and balance Reflexes: 1+ and symmetric. Toes downgoing.  ASSESSMENT: Jacob Holder is a 29 y.o. year old male presented with loss of vision, agitation and confusion after embolization with stent placement of right neck basilar aneurysm on 06/26/2019.  Stroke work-up revealed scattered bilateral embolic infarcts (bilateral parietal, left occipital and right cerebellar infarcts) with SAH and IVH.  He returned on 07/03/2019 with memory deficit, continued headache and numbness/tingling hands bilaterally with finding of new left cerebellar infarct secondary to medication noncompliance.  Vascular risk factors include large BA aneurysm with daily headaches s/p embolization with stent 06/26/2019, cocaine use, recent diagnosis of HIV, HTN, HLD, tobacco use, EtOH use, THC use and medication noncompliance.  Residual stroke deficits of blurred vision and memory impairment which has been stable but unfortunately therapy placed on hold due to difficulty scheduling appointments due to work schedule    PLAN:  1. Multiple strokes as above:  -Cognitive and visual deficits, poststroke: Referral placed to outpatient PT/OT/ST as further assistance will be provided in regards to  intermittent leave for office visits and therapy sessions.  Patient provided office with paperwork during today's visit which will be completed and submitted as it will be important to maintain proper follow-up and therapy sessions for further stroke recovery -Depression/anxiety: Underlying difficulty worsened post stroke.  Referral placed again to behavioral health to schedule initial evaluation  -Continue aspirin 81 mg daily and clopidogrel 75 mg daily  and atorvastatin for secondary stroke prevention. -Maintain strict control of hypertension with blood pressure goal below 130/90, diabetes with hemoglobin A1c goal below 6.5% and cholesterol with LDL cholesterol (bad cholesterol) goal below 70 mg/dL.  I also advised the patient to eat a healthy diet with plenty of whole grains, cereals, fruits and vegetables, exercise regularly with at least 30 minutes of continuous activity daily and maintain ideal body weight. 2. HTN: Stable.  Continue to follow with PCP for monitoring management 3. HLD: Continuation of atorvastatin and continue to follow with PCP for prescribing, monitoring and management 4. BA aneurysm: Follow-up with repeat imaging next month with Dr. Corliss Skains 5. Newly diagnosed HIV: Continue to follow with ID for ongoing monitoring and management 6. Polysubstance abuse: Denies ongoing use of cocaine or THC and encouraged ongoing avoidance which patient verbalized understanding 7. Tobacco use: Strongly encouraged complete cessation for secondary stroke prevention    Follow up in 4 months or call earlier if needed   I spent 40 minutes of face-to-face and non-face-to-face time with patient.  This included previsit chart review, lab review, study review, order entry, electronic health record documentation, patient education regarding stroke history, importance of managing stroke risk factors and answered all questions to patient satisfaction     Ihor Austin, [facility]  [facility] [address] [place], [place] [address]  Phone [phone] Fax [phone] Note: This document was prepared with digital dictation and possible smart phrase technology. Any transcriptional errors that result from this process are unintentional.

## 2019-11-11 NOTE — Patient Instructions (Signed)
Restart therapies at neuro rehab for ongoing difficulties - referral placed  Schedule evaluation with behavioral health - they should be calling you to schedule appointment  Follow up with Dr. Corliss Skains in June for follow up imaging  Will assist with filling out paperwork to provide time for appointments during work  Continue aspirin 81 mg daily and clopidogrel 75 mg daily  and atorvastatin  for secondary stroke prevention  Continue to follow up with PCP regarding cholesterol and blood pressure management   Continue to monitor blood pressure at home  Maintain strict control of hypertension with blood pressure goal below 130/90, diabetes with hemoglobin A1c goal below 6.5% and cholesterol with LDL cholesterol (bad cholesterol) goal below 70 mg/dL. I also advised the patient to eat a healthy diet with plenty of whole grains, cereals, fruits and vegetables, exercise regularly and maintain ideal body weight.  Followup in the future with me in 4 months or call earlier if needed       Thank you for coming to see [place] at [facility]. I hope we have been able to provide you high quality care today.  You may receive a patient satisfaction survey over the next few weeks. We would appreciate your feedback and comments so that we may continue to improve ourselves and the health of our patients.

## 2019-11-11 NOTE — Telephone Encounter (Signed)
Patient called and left VM for results from last week's visit. Called back and no answer.  Will send a mychart message as well.

## 2019-11-12 ENCOUNTER — Ambulatory Visit: Payer: Managed Care, Other (non HMO)

## 2019-11-12 NOTE — Progress Notes (Signed)
I agree with the above plan 

## 2019-11-13 ENCOUNTER — Telehealth: Payer: Self-pay | Admitting: *Deleted

## 2019-11-13 NOTE — Telephone Encounter (Signed)
I called pt and he was at work and needed to call me back.  (dates for PT).

## 2019-11-14 NOTE — Telephone Encounter (Signed)
Pt returned call.  Dates for the therapy he gave me : 09-16-19,09-19-19,09-20-19,10-02-19,10-11-19,10-16-19,10-31-19,57-21,11-11-19. For right now.  Other appts TBS.  He did not have those at this time.

## 2019-11-25 ENCOUNTER — Encounter: Payer: Self-pay | Admitting: Adult Health

## 2019-11-26 ENCOUNTER — Telehealth: Payer: Self-pay | Admitting: Adult Health

## 2019-11-26 NOTE — Telephone Encounter (Addendum)
Pt called back in regards to therapy dates and paperwork information that needs to be updated from his form on page 11 question 9 dates need to be  09-03-19 TO  03-02-20. States [employer]apacity estimated start date: should be 09-04-2019 to 03-02-2020 and up to 8 hrs needs to be added on page 11 at the bottom as well for the time gap.

## 2019-11-26 NOTE — Telephone Encounter (Signed)
Pt called again stating that only his PT dates were given to him but he is needing also his MRI date and times and any other appts he has had. Pt was informed again that RN will call him back this afternoon.

## 2019-11-26 NOTE — Telephone Encounter (Signed)
I was agreeable to fill out form in order for patient to participate in therapies and appointments as both are important to stroke recovery.  Typically, these forms are filled out depending on how many days per week therapy will take place and not necessarily on exact dates.  Follow-up appointments or imaging are not currently scheduled with Dr. Corliss Skains and he may need to speak with their office further in regards to this.  It may be easier to allow so many days per month for appointments vs days per week in order to cover for other appointments or images.  I filled out this paperwork shortly after he was provided to [place] and was given to [place], [place] for further completion.

## 2019-11-26 NOTE — Telephone Encounter (Signed)
I called pt about the reed form needs to be updated. Pt stated he almost quit his job today because it was a lot going on. Pt stated he had been communicating with Dois Davenport RN about the form and how it needs to be done.He is back at work and is in PT and ST. They have the dates on the form for therapy. They do not have the dates of all future appointments with Dr.Deveshwar and future images appt with him. Pt states that needs to be on the form along with the time frame.He stated we put 2 hours for images and appts. Pt states per his job the images appts needs to be more than 2 hours to allocate for transportation to and from work. He stated Shanda Bumps NP told him she do the [facility] form for images, appts with Dr.Deveshwar  for him ongoing till August 2021.I stated message will be sent to [facility] NP for review. Pt verbalized understanding.

## 2019-11-28 ENCOUNTER — Encounter: Payer: Self-pay | Admitting: Adult Health

## 2019-11-28 NOTE — Telephone Encounter (Signed)
I called pt and trying to understand wha the is asking for. He states the Accomodation #9 C duration needing to state about LOA 09-03-19 thru 03-02-20.  I asked about dates he gave me that he had been to therapy when I spoke to him when filled out form, after seeing [place] 11-11-19, he stated they were dates that he went to therapy (missed work for).  Since then learned that he was discharged from therapy due to missing sessions on 09-03-19.  Has not been to therapy.  He was getting ready to clock in and was not able to speak at the time to give me answers to my question about specific dates (he stated they were doctors appts).  He will call back.

## 2019-11-28 NOTE — Telephone Encounter (Signed)
Form addended and refaxed with confirmation to [employer] [phone]. To MR for scanning.

## 2019-11-28 NOTE — Telephone Encounter (Signed)
See other note

## 2019-11-28 NOTE — Telephone Encounter (Signed)
If additional orders are needed to restart therapy I will be more than happy to do so.  Therapy sessions will need to be scheduled to determine timeframe prior to filling out forms.  In regards to doctors appointments, please provide additional 2 days/month for follow-up visits.

## 2019-11-28 NOTE — Telephone Encounter (Signed)
Called patient back in regards to completion of form. He is requesting form to be filled out for when he returned back to work 09/03/2019 for a 6 month period (ending 03/02/2020). He states lady from his employers ADA office told him to put these date in. He needs paperwork approved prior to scheduling visits.  He is planning on restarting therapy 2 times weekly as well as additional 1-2 office visits per month between HIV clinic and IR.  Therefore, forms adjusted with the above dates for leave of absence 10 days within a 30-day timeframe for 3 hours.  Andrey Campanile, RN will fax forms.  He was advised to call office with any further questions or concerns.

## 2019-11-28 NOTE — Telephone Encounter (Signed)
I spoke to pt again,  He relayed that 3-15, 3-18, 09-20-19 he went to HIV visits, 10-11-19 ED visit/ [facility] visit.  He states needed the form by December 03, 2019. I relayed that per JM/NP she was not going to fill out form until he had scheduled visits down, and he stated he could not do that (because would need to have appts approved thru work first).   He stated he wanted to speak to JM/NP, I will relay to her.  Call mobile.

## 2019-11-28 NOTE — Telephone Encounter (Signed)
Jacob Begin, RN     9:35 AM Note   I called pt and trying to understand wha the is asking for. He states the Accomodation #9 C duration needing to state about LOA 09-03-19 thru 03-02-20.  I asked about dates he gave me that he had been to therapy when I spoke to him when filled out form, after seeing [place] 11-11-19, he stated they were dates that he went to therapy (missed work for).  Since then learned that he was discharged from therapy due to missing sessions on 09-03-19.  Has not been to therapy.  He was getting ready to clock in and was not able to speak at the time to give me answers to my question about specific dates (he stated they were doctors appts).  He will call back

## 2019-12-10 ENCOUNTER — Other Ambulatory Visit (HOSPITAL_COMMUNITY): Payer: Self-pay | Admitting: Interventional Radiology

## 2019-12-10 DIAGNOSIS — I671 Cerebral aneurysm, nonruptured: Secondary | ICD-10-CM

## 2019-12-26 ENCOUNTER — Ambulatory Visit (HOSPITAL_COMMUNITY): Payer: Managed Care, Other (non HMO)

## 2019-12-26 ENCOUNTER — Encounter (HOSPITAL_COMMUNITY): Payer: Self-pay

## 2019-12-30 ENCOUNTER — Telehealth (HOSPITAL_COMMUNITY): Payer: Self-pay

## 2019-12-30 NOTE — Telephone Encounter (Signed)
Called to reschedule mri, no answer, left vm. AW  

## 2020-01-06 NOTE — Progress Notes (Deleted)
Name: Jacob Holder  DOB: 04/20/1991 MRN: [id number] PCP: Benito Mccreedy, MD    Patient Active Problem List   Diagnosis Date Noted  . HIV disease ([place]) 07/09/2019  . Normochromic normocytic anemia 07/04/2019  . Stroke ([place]) 07/04/2019  . Drug use 07/04/2019  . History of cocaine use   . Acute CVA (cerebrovascular accident) ([place]) 07/03/2019  . Hypokalemia   . History of ETT   . SAH (subarachnoid hemorrhage) ([place]) 06/27/2019  . IVH (intraventricular hemorrhage) ([place]) 06/27/2019  . Cerebrovascular accident (CVA) due to embolism of posterior cerebral artery with infarctions of both occipital lobes ([place]) 06/27/2019  . 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 Aphasia 06/27/2019  . 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/26/2019  . Essential hypertension      Brief Narrative:  Jacob Holder is a 29 y.o. male with HIV Dx recently diagnosed recently 07/2019 during hospital screening with stroke.    CD4 nadir 478 VL 76,200 HIV Risk: MSM History of OIs: none Intake Labs 07/2019: Hep B sAg (-), sAb (-), cAb (-); Hep A (-), Hep C (-) Quantiferon (-) HLA B*5701 (-) G6PD: ()   Previous Regimens: . naive  Genotypes: . 07/2019 - wildtype  Subjective:  CC: New Patient visit - HIV entry to care.  Depression/Anxiety    HPI: Jacob Holder is a 29 y.o. male here for his entry to care visit to treat HIV disease. He was last screened for HIV in 2019 and non-reactive at that time. Has had only 19 male partner the last 4 years and unaware he was HIV+ until this recent admission. He feels very overwhelmed with everything and is very ready to start on medications as soon as possible. He has had trouble with depression and anxiety since his recent admission but depression pre-dates this. Started on buspar TID but feels this does not help his depression enough. Has been to counseling in the past but was not open to it then but would be now.    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 MRI following intervention revealed parietal and cerebellar and occipital strokesWas recommended SNF at discharge but declined. Came back to the hospital with memory deficit, numbness and tingling in hands. He has continued to follow up with neurology and rehab team and reports his memory and vision are improving.  He is to chronically maintain Plavix and ASA 17m daily. He does not miss his medication and takes it every morning when he wakes up.   Receives annual preventative and chronic disease care through his PCP and is up to date on all recommended screenings and vaccinations. He smokes cigarettes 2-5 a day now (down from 1.5 packs a day). Used to use cocaine however after his surgery in December he has abstained completely and has no plans to use again.   Not currently sexually active but prefers male partners. Screened recently with 3-point swabs for gonorrhea and chlamydia and all negative. Treated for syphilis as a freshman in college.    Review of Systems  Constitutional: Negative for chills, fever, malaise/fatigue and weight loss.  HENT:  Negative for sore throat.   Respiratory: Negative for cough, sputum production and shortness of breath.   Cardiovascular: Negative.   Gastrointestinal: Negative for abdominal pain, diarrhea and vomiting.  Musculoskeletal: Negative for joint pain, myalgias and neck pain.  Skin: Negative for rash.  Neurological: Negative for headaches.  Psychiatric/Behavioral: Negative for depression and substance abuse. The patient is not nervous/anxious.     Past Medical  History:  Diagnosis Date  . Aneurysm ([place])   . Anxiety   . Cocaine abuse ([place])   . Depression   . GERD (gastroesophageal reflux disease)   . Headache   . Stroke ([place])   . Wears contact lenses     Outpatient Medications Prior to Visit  Medication Sig Dispense Refill  . aspirin 81 MG chewable tablet Chew 81 mg by mouth daily.    Marland Kitchen atorvastatin (LIPITOR) 40 MG tablet Take 1 tablet (40 mg total) by mouth daily at 6 PM. 30 tablet 0  . bictegravir-emtricitabine-tenofovir AF (BIKTARVY) 50-200-25 MG TABS tablet Take 1 tablet by mouth daily. Try to take at the same time each day with or without food. 30 tablet 5  . clopidogrel (PLAVIX) 75 MG tablet Take 75 mg by mouth daily.     Marland Kitchen ibuprofen (ADVIL) 200 MG tablet Take 600 mg by mouth every 6 (six) hours as needed for headache.    . verapamil (CALAN) 80 MG tablet Take 1 tablet (80 mg total) by mouth 3 (three) times daily. 90 tablet 3   No facility-administered medications prior to visit.     No Known Allergies  Social History   Tobacco Use  . Smoking status: Current Every Day Smoker    Packs/day: 0.50    Types: Cigarettes  . Smokeless tobacco: Never Used  Vaping Use  . Vaping Use: Never used  Substance Use Topics  . Alcohol use: Yes    Comment: from time to time  . Drug use: No    Family History  Problem Relation Age of Onset  . Healthy Mother   . Thyroid disease Mother   . Diabetes Father     Social History   Substance and Sexual Activity  Sexual Activity Not Currently  . Birth control/protection: Condom     Objective:   There were no vitals filed for this visit. There is no height or weight on file to calculate BMI.  Physical Exam Vitals and nursing note reviewed.  Constitutional:      Appearance: He is well-developed.     Comments: Seated comfortably in chair during visit.   HENT:     Mouth/Throat:     Dentition: Normal dentition. No dental abscesses.  Cardiovascular:     Rate and Rhythm: Normal rate and  regular rhythm.     Heart sounds: Normal heart sounds.  Pulmonary:     Effort: Pulmonary effort is normal.     Breath sounds: Normal breath sounds.  Abdominal:     General: There is no distension.     Palpations: Abdomen is soft.     Tenderness: There is no abdominal tenderness.  Lymphadenopathy:     Cervical: No cervical adenopathy.  Skin:    General: Skin is warm and dry.     Findings: No rash.  Neurological:     Mental Status: He is alert and oriented to person, place, and time.  Psychiatric:        Judgment: Judgment normal.     Comments: In good spirits today and engaged in care discussion.  Lab Results Lab Results  Component Value Date   WBC 5.0 07/31/2019   HGB 11.3 (L) 07/31/2019   HCT 35.3 (L) 07/31/2019   MCV 86.3 07/31/2019   PLT 89 (L) 07/31/2019    Lab Results  Component Value Date   CREATININE 0.90 11/06/2019   BUN 7 11/06/2019   NA 137 11/06/2019   K 4.1 11/06/2019   CL 103 11/06/2019   CO2 26 11/06/2019    Lab Results  Component Value Date   ALT 16 11/06/2019   AST 17 11/06/2019   ALKPHOS 57 07/03/2019   BILITOT 0.4 11/06/2019    Lab Results  Component Value Date   CHOL 90 07/31/2019   HDL 29 (L) 07/31/2019   LDLCALC 47 07/31/2019   TRIG 61 07/31/2019   CHOLHDL 3.1 07/31/2019   HIV 1 RNA Quant (copies/mL)  Date Value  11/06/2019 36 (H)  07/31/2019 76,200 (H)   CD4 T Cell Abs (/uL)  Date Value  11/06/2019 574  07/31/2019 478     Assessment & Plan:   Problem List Items Addressed This Visit    None     RTC in 4 weeks with pharmacy team.  He declined flu shot today. Would like to start pneumonia vaccines at upcoming office visit.   Will also need Hep B and A vaccines.    Janene Madeira, MSN, NP-C [facility] [place] Pager: [phone] Office: [phone]  01/06/20  5:41 PM

## 2020-01-07 ENCOUNTER — Ambulatory Visit: Payer: Managed Care, Other (non HMO) | Admitting: Infectious Diseases

## 2020-01-09 ENCOUNTER — Telehealth: Payer: Self-pay | Admitting: Pharmacy Technician

## 2020-01-09 MED FILL — BIKTARVY 50-200-25 MG TABS: 50-200-25 | 30 days supply | Qty: 30 | Fill #5

## 2020-01-09 NOTE — Telephone Encounter (Signed)
[facility] Patient Advocate Encounter   Was successful in obtaining a [employer] copay card for Biktar[employer].  This copay card will make the patients copay $0.   The billing information is as follows and has been shared with [facility].   Banessa Mao E. Daisha Filosa, CPhT [facility] Patient Advocate [facility] Phone: [phone] Fax:  [phone]   

## 2020-01-14 ENCOUNTER — Telehealth (HOSPITAL_COMMUNITY): Payer: Self-pay

## 2020-01-14 NOTE — Telephone Encounter (Signed)
Called to reschedule mra, no answer, left vm. AW 

## 2020-01-29 ENCOUNTER — Telehealth (HOSPITAL_COMMUNITY): Payer: Self-pay

## 2020-01-29 NOTE — Telephone Encounter (Signed)
Third attempt to contact patient to schedule f/u mra, no answer, left vm. AW

## 2020-02-05 ENCOUNTER — Other Ambulatory Visit: Payer: Self-pay | Admitting: Infectious Diseases

## 2020-02-05 DIAGNOSIS — B20 Human immunodeficiency virus [HIV] disease: Secondary | ICD-10-CM

## 2020-02-06 ENCOUNTER — Encounter: Payer: Self-pay | Admitting: *Deleted

## 2020-02-06 MED FILL — BIKTARVY 50-200-25 MG TABS: 50-200-25 | 30 days supply | Qty: 30 | Fill #0

## 2020-03-10 MED FILL — BIKTARVY 50-200-25 MG TABS: 50-200-25 | 30 days supply | Qty: 30 | Fill #1

## 2020-03-18 ENCOUNTER — Encounter: Payer: Self-pay | Admitting: Adult Health

## 2020-03-18 ENCOUNTER — Ambulatory Visit: Payer: Self-pay | Admitting: Adult Health

## 2020-03-18 ENCOUNTER — Other Ambulatory Visit: Payer: Self-pay

## 2020-03-18 VITALS — BP 110/65 | HR 81 | Ht 71.0 in | Wt 155.0 lb

## 2020-03-18 DIAGNOSIS — E785 Hyperlipidemia, unspecified: Secondary | ICD-10-CM

## 2020-03-18 DIAGNOSIS — I10 Essential (primary) hypertension: Secondary | ICD-10-CM

## 2020-03-18 DIAGNOSIS — I671 Cerebral aneurysm, nonruptured: Secondary | ICD-10-CM

## 2020-03-18 DIAGNOSIS — F331 Major depressive disorder, recurrent, moderate: Secondary | ICD-10-CM

## 2020-03-18 DIAGNOSIS — I63433 Cerebral infarction due to embolism of bilateral posterior cerebral arteries: Secondary | ICD-10-CM

## 2020-03-18 MED ORDER — SERTRALINE HCL 25 MG PO TABS: 25.0000 mg | ORAL_TABLET | Freq: Every day | ORAL | 4 refills | Status: DC

## 2020-03-18 NOTE — Progress Notes (Signed)
[facility] [address] [place] [id number] [phone]       STROKE FOLLOW UP NOTE  Mr. Jacob LamasDominique Bifulco Date of Birth:  September 10, 1990 Medical Record Number:  [id number]   Reason for Referral: stroke follow up    CHIEF COMPLAINT:  Chief Complaint  Patient presents with  . Follow-up    rm 5  . Cerebrovascular Accident    HPI:   Today, 03/18/2020, [name] returns for stroke follow-up  He has done well from a stroke standpoint since prior visit with resolution of memory and visual concerns reports currently at baseline His greatest concern today is in regards to continued depression.  Prior history of depression but greatly worsened post stroke.  Previously referred to psychiatry but apparently referral declined as the physician that referral was sent to was not accepting new patients.  He is not currently on medication management.  He unfortunately lost his prior job due to his depression and inability to get out of bed and function.  He also reports decreased appetite and lack of sleep.  He becomes tearful while speaking of this.  He has since found another job and plans on starting 9/27.  He denies suicidal ideation or thoughts. Denies new or worsening stroke/TIA symptoms  Remains on aspirin and Plavix without bleeding or bruising -he has not had follow-up with Dr. Corliss Skainseveshwar due to miscommunication of appointment time Remains on atorvastatin 40 mg daily without myalgias Blood pressure today satisfactory at 110/65  No further concerns at this time     History provided for reference purposes only Update 11/11/2019 JM: [name] returns for stroke follow-up.  Residual deficits memory and visual impairment.  Previously participating in outpatient therapies but unfortunately had difficulty with follow-up compliance due to being unable to get time off work.  Denies new or worsening stroke/TIA symptoms.  Continues on aspirin and Plavix without bleeding or  bruising.  Continues on atorvastatin without myalgias.  Blood pressure today 123/78.  Follow-up with Dr. Corliss Skainseveshwar next month with repeat imaging for prior intervention of basilar aneurysm.  Underlying depression/anxiety worsened post stroke and referred to behavioral health but unfortunately has not been able to schedule evaluation due to work schedule.  Previously on BuSpar but self discontinued due to lack of benefit.  He does endorse slight improvement especially after excepting HIV diagnosis -continue to follow closely with ID.  No further concerns at this time.  Initial visit 08/08/2019 JM: [name] is a 29 year old male who is being seen today for hospital follow-up.  Residual deficits include subjective memory concerns, difficulty with vision stating difficulty with focusing and mildly blurred vision but denies peripheral visual field loss or diplopia.  He continues to participate with OT with ongoing improvement.  He has not been evaluated by ophthalmology at this time.  He does endorse improvement as he initially had difficulty with walking into objects but this has since improved as well as improvement of headaches.  He does endorse great difficulty with depression/anxiety currently on BuSpar which was initiated during hospitalization with mild benefit.  He does have prior history of depression/anxiety with suicide ideation.  He denies suicidal ideation at this time.  He does not have established psychologist/psychiatrist at this time but is interested in establishing care.  He does endorse ongoing compliance with aspirin and Plavix without bleeding or bruising.  Ongoing compliance with atorvastatin without myalgias.  Blood pressure today 122/64.  Ongoing tobacco use currently smoking approximately one fourth of a pack where prior  1 pack daily.  He denies continued substance abuse with THC or cocaine stating "I learned my lesson" in regards to increased risk with ongoing use.  Denies new or worsening  stroke/TIA symptoms.  Stroke admission 06/26/2019: [name] is a 29 y.o. 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 Corliss Skains who developed loss of vision, agitation and confusion post procedure.  MRI revealed bilateral parietal, left occipital and right cerebellar infarcts along with small amount of basilar cistern subarachnoid hemorrhage.  Evaluated by stroke team and Dr. Pearlean Brownie for scattered bilateral embolic infarcts with SAH and IVH post BA aneurysm repair in setting of cocaine use.  CTA head/neck showed stent from mid basilar artery into the left P2 segment.  CT head showed left basilar cistern SAH stable with trace IVH and repeat CT head decreasing intraventricular blood products.  2D echo showed an EF of 60 to 65% without cardiac source of embolus identified.  Recommended DAPT on discharge due to stent and ongoing management by Dr. Corliss Skains.  History of HTN currently on verapamil 80 mg 3 times daily stable during admission.  He was also previously started on verapamil due to diagnosis of cluster headaches.  LDL 73 and A1c 5.5.  UDS positive for cocaine with discussion regarding importance of cessation.  He was noted to have cocaine withdrawal-induced agitation during admission.  Current tobacco use with smoking cessation counseling provided.  Other stroke risk factors include EtOH use and advise no more than 2 drinks daily and recent diagnosis of HIV.  Other active problems include anxiety/depression, GERD and hypokalemia.  Recommended participation with outpatient therapies but unfortunately patient left AMA on 06/30/2019. He returned to ED on 07/02/2019 with complaints of memory loss and headache.  He underwent CT head which were unremarkable and left without being seen. He returned on 07/03/2019 due to decreased memory, numbness/tingling in hands bilaterally, headaches and "not feeling right".   Evaluated by stroke team and Dr. Roda Shutters with stroke work-up revealing new left cerebellar infarct as evidenced on MRI new from 12/24 in setting of post BA embolization/stenting infarcts noncompliant with medications after discharge.  Advised DAPT and ensure compliance for secondary stroke prevention.  Also advised to restart atorvastatin 40 mg daily with prior LDL 73 for secondary stroke prevention.  Repeat UDS negative.  He was discharged home in stable condition with recommendation of follow-up with Dr. Corliss Skains outpatient for monitoring management.    ROS:   14 system review of systems performed and negative with exception of those listed in HPI  GAD 7 : Generalized Anxiety Score 03/18/2020  Nervous, Anxious, on Edge 3  Control/stop worrying 3  Worry too much - different things 3  Trouble relaxing 3  Restless 3  Easily annoyed or irritable 3  Afraid - awful might happen 3  Total GAD 7 Score 21  Anxiety Difficulty Extremely difficult   Depression screen [facility] 2/9 03/18/2020 08/12/2019  Decreased Interest 3 2  Down, Depressed, Hopeless 3 2  PHQ - 2 Score 6 4  Altered sleeping 3 2  Tired, decreased energy 3 3  Change in appetite 3 1  Feeling bad or failure about yourself  3 3  Trouble concentrating 3 3  Moving slowly or fidgety/restless 2 3  Suicidal thoughts 2 0  PHQ-9 Score 25 19  Difficult doing work/chores - Very difficult      PMH:  Past Medical History:  Diagnosis Date  .  Aneurysm (HCC)   . Anxiety   . Cocaine abuse (HCC)   . Depression   . GERD (gastroesophageal reflux disease)   . Headache   . Stroke (HCC)   . Wears contact lenses     PSH:  Past Surgical History:  Procedure Laterality Date  . IR ANGIO INTRA EXTRACRAN SEL COM CAROTID INNOMINATE BILAT MOD SED  03/05/2019  . IR ANGIO VERTEBRAL SEL VERTEBRAL BILAT MOD SED  03/05/2019  . IR ANGIO VERTEBRAL SEL VERTEBRAL UNI R MOD SED  06/26/2019  . IR ANGIOGRAM FOLLOW UP STUDY  06/26/2019  . IR NEURO EACH ADD'L AFTER  BASIC UNI RIGHT (MS)  06/26/2019  . IR TRANSCATH/EMBOLIZ  06/26/2019  . RADIOLOGY WITH ANESTHESIA N/A 06/26/2019   Procedure: EMBOLIZATION;  Surgeon: Julieanne Cotton, MD;  Location: [facility] OR;  Service: Radiology;  Laterality: N/A;  . RECTAL SURGERY    . TOOTH EXTRACTION      Social History:  Social History   Socioeconomic History  . Marital status: Single    Spouse name: Not on file  . Number of children: 0  . Years of education: Not on file  . Highest education level: Some college, no degree  Occupational History  . Occupation: unemployed  Tobacco Use  . Smoking status: Current Every Day Smoker    Packs/day: 0.50    Types: Cigarettes  . Smokeless tobacco: Never Used  Vaping Use  . Vaping Use: Never used  Substance and Sexual Activity  . Alcohol use: Yes    Comment: from time to time  . Drug use: No  . Sexual activity: Not Currently    Birth control/protection: Condom  Other Topics Concern  . Not on file  Social History Narrative   Patient is left-handed. He lives in a 2 level home. He drinks tea 2-3 glasses a day, and coffee occassionally.   Social Determinants of Health   Financial Resource Strain:   . Difficulty of Paying Living Expenses: Not on file  Food Insecurity:   . Worried About Programme researcher, broadcasting/film/video in the Last Year: Not on file  . Ran Out of Food in the Last Year: Not on file  Transportation Needs:   . Lack of Transportation (Medical): Not on file  . Lack of Transportation (Non-Medical): Not on file  Physical Activity:   . Days of Exercise per Week: Not on file  . Minutes of Exercise per Session: Not on file  Stress:   . Feeling of Stress : Not on file  Social Connections:   . Frequency of Communication with Friends and Family: Not on file  . Frequency of Social Gatherings with Friends and Family: Not on file  . Attends Religious Services: Not on file  . Active Member of Clubs or Organizations: Not on file  . Attends Banker Meetings:  Not on file  . Marital Status: Not on file  Intimate Partner Violence:   . Fear of Current or Ex-Partner: Not on file  . Emotionally Abused: Not on file  . Physically Abused: Not on file  . Sexually Abused: Not on file    Family History:  Family History  Problem Relation Age of Onset  . Healthy Mother   . Thyroid disease Mother   . Diabetes Father     Medications:   Current Outpatient Medications on File Prior to Visit  Medication Sig Dispense Refill  . aspirin 81 MG chewable tablet Chew 81 mg by mouth daily.    Marland Kitchen atorvastatin (  LIPITOR) 40 MG tablet Take 1 tablet (40 mg total) by mouth daily at 6 PM. 30 tablet 0  . BIKTARVY 50-200-25 MG TABS tablet TAKE 1 TABLET BY MOUTH DAILY. TRY TO TAKE AT THE SAME TIME EACH DAY WITH OR WITHOUT FOOD. 30 tablet 1  . clopidogrel (PLAVIX) 75 MG tablet Take 75 mg by mouth daily.     Marland Kitchen ibuprofen (ADVIL) 200 MG tablet Take 600 mg by mouth every 6 (six) hours as needed for headache.    . verapamil (CALAN) 80 MG tablet Take 1 tablet (80 mg total) by mouth 3 (three) times daily. 90 tablet 3   No current facility-administered medications on file prior to visit.    Allergies:  No Known Allergies   Physical Exam  Vitals:   03/18/20 1440  BP: 110/65  Pulse: 81  Weight: 155 lb (70.3 kg)  Height: 5\' 11"  (1.803 m)   Body mass index is 21.62 kg/m. No exam data present  General: well developed, well nourished, pleasant young African-American male, seated, in no evident distress Head: head normocephalic and atraumatic.   Neck: supple with no carotid or supraclavicular bruits Cardiovascular: regular rate and rhythm, no murmurs Musculoskeletal: no deformity Skin:  no rash/petichiae Vascular:  Normal pulses all extremities   Neurologic Exam Mental Status: Awake and fully alert. Normal speech and language. Oriented to place and time. Recent and remote memory intact. Attention span, concentration and fund of knowledge appropriate. Mood and affect  flat and tearful Cranial Nerves: Pupils equal, briskly reactive to light. Extraocular movements full without nystagmus. Visual fields full to confrontation. Hearing intact. Facial sensation intact. Face, tongue, palate moves normally and symmetrically.  Motor: Normal bulk and tone. Normal strength in all tested extremity muscles. Sensory.: intact to touch , pinprick , position and vibratory sensation.  Coordination: Rapid alternating movements normal in all extremities. Finger-to-nose and heel-to-shin performed accurately bilaterally. Gait and Station: Arises from chair without difficulty. Stance is normal. Gait demonstrates normal stride length and balance Reflexes: 1+ and symmetric. Toes downgoing.       ASSESSMENT/PLAN: Jacob Holder is a 29 y.o. year old male presented with loss of vision, agitation and confusion after embolization with stent placement of right neck basilar aneurysm on 06/26/2019.  Stroke work-up revealed scattered bilateral embolic infarcts (bilateral parietal, left occipital and right cerebellar infarcts) with SAH and IVH.  He returned on 07/03/2019 with memory deficit, continued headache and numbness/tingling hands bilaterally with finding of new left cerebellar infarct secondary to medication noncompliance.  Vascular risk factors include large BA aneurysm with daily headaches s/p embolization with stent 06/26/2019, cocaine use, recent diagnosis of HIV, HTN, HLD, tobacco use, EtOH use, THC use and medication noncompliance.      1. Multiple strokes as above:  a. Recovered well without residual deficits at this time. b. Does report underlying depression worsened post stroke with high PHQ-9 and GAD-7 scores.  Initiate sertraline 25 mg nightly.  Referral placed to psychiatry for further evaluation and medication management. c. Continue aspirin 81 mg daily and clopidogrel 75 mg daily  and atorvastatin for secondary stroke prevention. d. Discussed importance of close PCP  follow-up for aggressive stroke risk factor management 2. HTN: BP goal<130/90. Stable today.  Managed by PCP 3. HLD: LDL goal<70.  Continue atorvastatin.  Managed and monitored by PCP 4. BA aneurysm: Need to schedule follow-up with Dr. 06/28/2019 as he missed prior visit.  He is currently waiting until he starts his new job as he is currently uninsured  but does plan on following up with them once he has insurance again 5. Newly diagnosed HIV: Advised to schedule follow-up with ID as he missed prior visits    Follow up in 4 months or call earlier if needed   I spent 30 minutes of face-to-face and non-face-to-face time with patient.  This included previsit chart review, lab review, study review, order entry, electronic health record documentation, patient education regarding stroke history, worsening depression post stroke, importance of managing stroke risk factors and answered all questions to patient satisfaction   Ihor Austin, [facility]  [facility] [address] [place], [place] [address]  Phone [phone] Fax [phone] Note: This document was prepared with digital dictation and possible smart phrase technology. Any transcriptional errors that result from this process are unintentional.

## 2020-03-18 NOTE — Patient Instructions (Addendum)
Your Plan:  Recommend initiating sertraline 25 mg nightly for depression management -please allow at least 4 weeks for full benefit but after that time, please call office with possible need of increase  Referral placed to psychiatry  Continue aspirin and Plavix and ensure follow-up with Dr. Corliss Skains once you receive insurance  Continue to follow-up with ID routinely    Follow-up in 4 months or call earlier if needed     Thank you for coming to see [place] at [facility]. I hope we have been able to provide you high quality care today.  You may receive a patient satisfaction survey over the next few weeks. We would appreciate your feedback and comments so that we may continue to improve ourselves and the health of our patients.

## 2020-03-19 NOTE — Progress Notes (Signed)
I agree with the above plan 

## 2020-04-06 ENCOUNTER — Other Ambulatory Visit: Payer: Self-pay | Admitting: Infectious Diseases

## 2020-04-06 DIAGNOSIS — B20 Human immunodeficiency virus [HIV] disease: Secondary | ICD-10-CM

## 2020-04-07 MED FILL — BIKTARVY 50-200-25 MG TABS: 50-200-25 | 30 days supply | Qty: 30 | Fill #0

## 2020-04-23 ENCOUNTER — Ambulatory Visit: Payer: Managed Care, Other (non HMO) | Admitting: Infectious Diseases

## 2020-05-08 ENCOUNTER — Telehealth: Payer: Self-pay

## 2020-05-08 NOTE — Telephone Encounter (Signed)
[facility] Patient Advocate Encounter  [facility] and I have been unsuccsessful in reaching patient to be able to refill medication.    We have tried multiple times without a response.  Serrita Lueth, CPhT Specialty Pharmacy Patient Advocate [facility] Phone: [phone] Fax:  [phone]  

## 2020-05-14 ENCOUNTER — Other Ambulatory Visit: Payer: Self-pay | Admitting: Infectious Diseases

## 2020-05-14 DIAGNOSIS — B20 Human immunodeficiency virus [HIV] disease: Secondary | ICD-10-CM

## 2020-05-19 MED FILL — BIKTARVY 50-200-25 MG TABS: 50-200-25 | 30 days supply | Qty: 30 | Fill #0

## 2020-05-25 ENCOUNTER — Other Ambulatory Visit: Payer: Self-pay

## 2020-05-25 ENCOUNTER — Encounter: Payer: Self-pay | Admitting: Infectious Diseases

## 2020-05-25 ENCOUNTER — Other Ambulatory Visit: Payer: Self-pay | Admitting: Infectious Diseases

## 2020-05-25 ENCOUNTER — Ambulatory Visit (INDEPENDENT_AMBULATORY_CARE_PROVIDER_SITE_OTHER): Payer: Self-pay | Admitting: Infectious Diseases

## 2020-05-25 VITALS — BP 125/82 | HR 99 | Temp 98.2°F | Ht 71.0 in | Wt 159.0 lb

## 2020-05-25 DIAGNOSIS — Z23 Encounter for immunization: Secondary | ICD-10-CM

## 2020-05-25 DIAGNOSIS — I639 Cerebral infarction, unspecified: Secondary | ICD-10-CM

## 2020-05-25 DIAGNOSIS — B20 Human immunodeficiency virus [HIV] disease: Secondary | ICD-10-CM

## 2020-05-25 MED ORDER — BIKTARVY 50-200-25 MG PO TABS: 1.0000 | ORAL_TABLET | Freq: Every day | ORAL | 5 refills | Status: DC

## 2020-05-25 NOTE — Assessment & Plan Note (Addendum)
We reviewed his historical labs today.  Initial viral load over 76,000.  Follow-up 3 months later nearly undetectable with only 36 copies.  He has a good understanding as to what this means in his medications working well.  CD4 count showing good recovery nearly 600 cells.  We will repeat today.  We will continue Biktarvy once daily as instructed.  We discussed The new the including the advantages and disadvantages.  I have a little reservation offering this for him due to his track record with missed appointments.  Stressed the importance of this with him today, I think he will come for these treatment appointments.  Part of his problem with depression I believe is the daily maintenance of medications. Pneumovax administered today with next dose due in 5 years.  Politely declined the flu and Covid vaccines. Excepted condoms, STI screening declined.  We will have him back for routine care in 3 mo. He met with our financial team today.

## 2020-05-25 NOTE — Assessment & Plan Note (Signed)
He has not been able to afford several of his medications due to the loss of his insurance.  I asked him to reach out to the neurology team to see if there is any patient assistance or samples available to bridge him until January when his new insurance kicks in for the Plavix or statin.  His blood pressure looks great today.  I asked him to please continue taking his aspirin once daily.

## 2020-05-25 NOTE — Patient Instructions (Addendum)
Please schedule a visit with Marylu Lund at the front desk before you leave.   We gave you your second pneumonia vaccine today. You will not be due again in 5 years.   Please continue the biktarvy once a day like you have been doing now.  Stop by the lab on your way out today   Please continue your Aspirin and call the neurologist office to see if there is any help they can provide for plavix. They may have samples to help bridge you until you have insurance. We want to prevent with the best certainty that you won't have any problems with blockages to that stent.  [phone]   Please come back here in 4 months for care - will put you on our list of patients who would like to consider the injectable    CABENUVA is the new medication to treat HIV. It is an injection in each butt cheek every 30 days at this time.   We would start with a pill "lead in" period for you where you take 2 pills once a day with food for 28 days to make sure you tolerate them well before we do the long acting injection.   Largely the side effects were of course related to the injection itself, but during the studies where this was used to treat HIV patients there were only a very small (< 5) number of patients that stopped it due to pain from the shots. Most of the patients in the trials reported overall improvement in the pain associated with the shots over time.   Patient Information Link: https://gskpro.com/content/dam/global/hcpportal/en_[place]/Prescribing_Information/Cabenuva/pdf/[email or web address]-PI-PIL-[email or web address]-[email or web address].PDF#page=36

## 2020-05-25 NOTE — Progress Notes (Signed)
Name: Jacob Holder  DOB: Feb 07, 1991 MRN: [id number] PCP: Benito Mccreedy, MD    Patient Active Problem List   Diagnosis Date Noted  . HIV disease ([place]) 07/09/2019  . Normochromic normocytic anemia 07/04/2019  . Stroke ([place]) 07/04/2019  . Drug use 07/04/2019  . History of cocaine use   . Hypokalemia   . SAH (subarachnoid hemorrhage) ([place]) 06/27/2019  . IVH (intraventricular hemorrhage) ([place]) 06/27/2019  . Cerebrovascular accident (CVA) due to embolism of posterior cerebral artery with infarctions of both occipital lobes ([place]) 06/27/2019  . 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/26/2019  . Essential hypertension      Brief Narrative:  Jacob Holder is a 29 y.o. male with HIV Dx recently diagnosed recently 07/2019 during hospital screening with stroke.    CD4 nadir 478 VL 76,200 HIV Risk: MSM History of OIs: none Intake Labs 07/2019: Hep B sAg (-), sAb (-), cAb (-); Hep A (-), Hep C (-) Quantiferon (-) HLA B*5701 (-) G6PD: ()   Previous Regimens: . Biktarvy   Genotypes: . 07/2019 - wildtype  Subjective:   Chief Complaint  Patient presents with  . Follow-up    B20 follow up, wants injectable, given condom       HPI: Jacob Holder is back for follow up today. He has missed several appointments along the way since starting on [place] in February. He assures me he has been taking his medicaiton, however. Recalls one missed dose but does not feel like he has missed many more since.  He is very interested in the long-acting Cabenuva injection would like to know more information and whether he is a candidate for this.  He has not received a flu shot this year, politely declined stating that a friend of his got sick after taking it.  Not interested in the Covid vaccine mother for concern over side effects.  He has had no changes to his health since her last office visit.  Follow-up with the neurology team a few months ago and was supposed to be continuing  his aspirin and Plavix once daily.  He is taking the aspirin but unfortunately lost his job and insurance and is unable to afford the Plavix.  His blood pressure is under better control.  He has no headaches or vision changes.  No scheduled follow-up at this time with neurology.  No concern for STD exposure.  Excepting of condoms today.  No active symptoms.  He is eating and sleeping well.  Does have some ongoing trouble with depressed mood although is described to be better than in February when we last met.  He is open to schedule an appointment with Marcie Bal.  Unable to afford the Zoloft that was previously prescribed to him.  Depression screen PHQ 2/9 05/25/2020  Decreased Interest 2  Down, Depressed, Hopeless 2  PHQ - 2 Score 4  Altered sleeping 3  Tired, decreased energy 3  Change in appetite 1  Feeling bad or failure about yourself  3  Trouble concentrating 2  Moving slowly or fidgety/restless 0  Suicidal thoughts -  PHQ-9 Score 16  Difficult doing work/chores Somewhat difficult    Review of Systems  Constitutional: Negative for chills, fever, malaise/fatigue and weight loss.  HENT: Negative for sore throat.   Respiratory: Negative for cough, sputum production and shortness of breath.   Cardiovascular: Negative.   Gastrointestinal: Negative for abdominal pain, diarrhea and vomiting.  Musculoskeletal: Negative for joint pain, myalgias and neck pain.  Skin: Negative for rash.  Neurological: Negative for headaches.  Psychiatric/Behavioral: Negative for depression and substance abuse. The patient is not nervous/anxious.     Past Medical History:  Diagnosis Date  . Aneurysm ([place])   . Anxiety   . Cocaine abuse ([place])   . Depression   . GERD (gastroesophageal reflux disease)   . Headache   . Stroke ([place])   . Wears contact lenses     Outpatient Medications Prior to Visit  Medication Sig Dispense Refill  . aspirin 81 MG chewable tablet Chew 81 mg by mouth daily.    Marland Kitchen BIKTARVY  50-200-25 MG TABS tablet TAKE 1 TABLET BY MOUTH DAILY. TRY TO TAKE AT THE SAME TIME EACH DAY WITH OR WITHOUT FOOD. 30 tablet 0  . atorvastatin (LIPITOR) 40 MG tablet Take 1 tablet (40 mg total) by mouth daily at 6 PM. (Patient not taking: Reported on 05/25/2020) 30 tablet 0  . clopidogrel (PLAVIX) 75 MG tablet Take 75 mg by mouth daily.  (Patient not taking: Reported on 05/25/2020)    . sertraline (ZOLOFT) 25 MG tablet Take 1 tablet (25 mg total) by mouth at bedtime. (Patient not taking: Reported on 05/25/2020) 30 tablet 4  . verapamil (CALAN) 80 MG tablet Take 1 tablet (80 mg total) by mouth 3 (three) times daily. (Patient not taking: Reported on 05/25/2020) 90 tablet 3   No facility-administered medications prior to visit.     No Known Allergies  Social History   Tobacco Use  . Smoking status: Current Every Day Smoker    Packs/day: 0.50    Types: Cigarettes  . Smokeless tobacco: Never Used  Vaping Use  . Vaping Use: Never used  Substance Use Topics  . Alcohol use: Yes    Comment: from time to time  . Drug use: No    Social History   Substance and Sexual Activity  Sexual Activity Not Currently  . Birth control/protection: Condom     Objective:   Vitals:   05/25/20 1512  BP: 125/82  Pulse: 99  Temp: 98.2 F (36.8 C)  TempSrc: Oral  SpO2: 100%  Weight: 159 lb (72.1 kg)  Height: _0  (1.803 m)   Body mass index is 22.18 kg/m.  Physical Exam Vitals and nursing note reviewed.  Constitutional:      Appearance: He is well-developed.     Comments: Seated comfortably in chair during visit.   HENT:     Mouth/Throat:     Dentition: Normal dentition. No dental abscesses.  Cardiovascular:     Rate and Rhythm: Normal rate and regular rhythm.     Heart sounds: Normal heart sounds.  Pulmonary:     Effort: Pulmonary effort is normal.     Breath sounds: Normal breath sounds.  Abdominal:     General: There is no distension.     Palpations: Abdomen is soft.      Tenderness: There is no abdominal tenderness.  Lymphadenopathy:     Cervical: No cervical adenopathy.  Skin:    General: Skin is warm and dry.     Findings: No rash.  Neurological:     Mental Status: He is alert and oriented to person, place, and time.  Psychiatric:        Judgment: Judgment normal.     Comments: In good spirits today and engaged in care discussion.      Lab Results Lab Results  Component Value Date   WBC 5.0 07/31/2019   HGB 11.3 (L) 07/31/2019   HCT 35.3 (L)  07/31/2019   MCV 86.3 07/31/2019   PLT 89 (L) 07/31/2019    Lab Results  Component Value Date   CREATININE 0.90 11/06/2019   BUN 7 11/06/2019   NA 137 11/06/2019   K 4.1 11/06/2019   CL 103 11/06/2019   CO2 26 11/06/2019    Lab Results  Component Value Date   ALT 16 11/06/2019   AST 17 11/06/2019   ALKPHOS 57 07/03/2019   BILITOT 0.4 11/06/2019    Lab Results  Component Value Date   CHOL 90 07/31/2019   HDL 29 (L) 07/31/2019   LDLCALC 47 07/31/2019   TRIG 61 07/31/2019   CHOLHDL 3.1 07/31/2019   HIV 1 RNA Quant (copies/mL)  Date Value  11/06/2019 36 (H)  07/31/2019 76,200 (H)   CD4 T Cell Abs (/uL)  Date Value  11/06/2019 574  07/31/2019 478     Assessment & Plan:   Problem List Items Addressed This Visit      Unprioritized   Stroke [facility])    He has not been able to afford several of his medications due to the loss of his insurance.  I asked him to reach out to the neurology team to see if there is any patient assistance or samples available to bridge him until January when his new insurance kicks in for the Plavix or statin.  His blood pressure looks great today.  I asked him to please continue taking his aspirin once daily.      HIV disease ([place]) - Primary (Chronic)    We reviewed his historical labs today.  Initial viral load over 76,000.  Follow-up 3 months later nearly undetectable with only 36 copies.  He has a good understanding as to what this means in his medications  working well.  CD4 count showing good recovery nearly 600 cells.  We will repeat today.  We will continue Biktarvy once daily as instructed.  We discussed The new the including the advantages and disadvantages.  I have a little reservation offering this for him due to his track record with missed appointments.  Stressed the importance of this with him today, I think he will come for these treatment appointments.  Part of his problem with depression I believe is the daily maintenance of medications. Pneumovax administered today with next dose due in 5 years.  Politely declined the flu and Covid vaccines. Excepted condoms, STI screening declined.  We will have him back for routine care in 3 mo. He met with our financial team today.       Relevant Medications   bictegravir-emtricitabine-tenofovir AF (BIKTARVY) 50-200-25 MG TABS tablet   Other Relevant Orders   HIV-1 RNA quant-no reflex-bld   T-helper cell (CD4)- ([facility] clinic only)   Pneumococcal polysaccharide vaccine 23-valent greater than or equal to 2yo subcutaneous/IM (Completed)    Other Visit Diagnoses    Need for pneumococcal vaccination       Relevant Orders   Pneumococcal polysaccharide vaccine 23-valent greater than or equal to 2yo subcutaneous/IM (Completed)     RTC in 4 weeks with pharmacy team.  He declined flu shot today. Would like to start pneumonia vaccines at upcoming office visit.   Will also need Hep B and A vaccines.    Janene Madeira, MSN, NP-C [facility] [place] Pager: [phone] Office: [phone]  05/25/20  3:43 PM

## 2020-05-26 LAB — T-HELPER CELL (CD4) - (RCID CLINIC ONLY)
CD4 % Helper T Cell: 31 % — ABNORMAL LOW (ref 33–65)
CD4 T Cell Abs: 687 /uL (ref 400–1790)

## 2020-05-27 LAB — HIV-1 RNA QUANT-NO REFLEX-BLD
HIV 1 RNA Quant: <20 Copies/mL
HIV-1 RNA Quant, Log: <1.3 Log cps/mL

## 2020-06-04 ENCOUNTER — Ambulatory Visit: Payer: Self-pay

## 2020-07-27 NOTE — Progress Notes (Deleted)
[facility] [address] [place]. Page [id number] [phone]       STROKE FOLLOW UP NOTE  Mr. Oletta LamasDominique Metayer Date of Birth:  [birth year] Medical Record Number:  [id number]   Reason for Referral: stroke follow up    CHIEF COMPLAINT:  No chief complaint on file.   HPI:   Today, 07/28/2020, [name] returns for 5144-month stroke follow-up.  He has been doing well since prior visit without new or recurrent stroke/TIA symptoms.  He has remained on aspirin without bleeding or bruising but unfortunately due to financial reasons and lack of insurance, he is not currently on atorvastatin or Plavix.  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  Depression ***.  He has had follow-up with ID since prior visit but has not had follow-up with IR.      History provided for reference purposes only Update 03/18/2020 JM: [name] returns for stroke follow-up He has done well from a stroke standpoint since prior visit with resolution of memory and visual concerns reports currently at baseline His greatest concern today is in regards to continued depression.  Prior history of depression but greatly worsened post stroke.  Previously referred to psychiatry but apparently referral declined as the physician that referral was sent to was not accepting new patients.  He is not currently on medication management.  He unfortunately lost his prior job due to his depression and inability to get out of bed and function.  He also reports decreased appetite and lack of sleep.  He becomes tearful while speaking of this.  He has since found another job and plans on starting 9/27.  He denies suicidal ideation or thoughts. Denies new or worsening stroke/TIA symptoms Remains on aspirin and Plavix without bleeding or bruising -he has not had follow-up with Dr. Corliss Skainseveshwar due to miscommunication of appointment time Remains on atorvastatin 40 mg daily  without myalgias Blood pressure today satisfactory at 110/65 No further concerns at this time  Update 11/11/2019 JM: [name] returns for stroke follow-up.  Residual deficits memory and visual impairment.  Previously participating in outpatient therapies but unfortunately had difficulty with follow-up compliance due to being unable to get time off work.  Denies new or worsening stroke/TIA symptoms.  Continues on aspirin and Plavix without bleeding or bruising.  Continues on atorvastatin without myalgias.  Blood pressure today 123/78.  Follow-up with Dr. Corliss Skainseveshwar next month with repeat imaging for prior intervention of basilar aneurysm.  Underlying depression/anxiety worsened post stroke and referred to behavioral health but unfortunately has not been able to schedule evaluation due to work schedule.  Previously on BuSpar but self discontinued due to lack of benefit.  He does endorse slight improvement especially after excepting HIV diagnosis -continue to follow closely with ID.  No further concerns at this time.  Initial visit 08/08/2019 JM: [name] is a [age] year old male who is being seen today for hospital follow-up.  Residual deficits include subjective memory concerns, difficulty with vision stating difficulty with focusing and mildly blurred vision but denies peripheral visual field loss or diplopia.  He continues to participate with OT with ongoing improvement.  He has not been evaluated by ophthalmology at this time.  He does endorse improvement as he initially had difficulty with walking into objects but this has since improved as well as improvement of headaches.  He does endorse great difficulty with depression/anxiety currently on BuSpar which was initiated during hospitalization with mild benefit.  He  does have prior history of depression/anxiety with suicide ideation.  He denies suicidal ideation at this time.  He does not have established psychologist/psychiatrist at this time but is interested  in establishing care.  He does endorse ongoing compliance with aspirin and Plavix without bleeding or bruising.  Ongoing compliance with atorvastatin without myalgias.  Blood pressure today 122/64.  Ongoing tobacco use currently smoking approximately one fourth of a pack where prior 1 pack daily.  He denies continued substance abuse with THC or cocaine stating "I learned my lesson" in regards to increased risk with ongoing use.  Denies new or worsening stroke/TIA symptoms.  Stroke admission 06/26/2019: [name] is a 30 y.o. 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 Corliss Skains who developed loss of vision, agitation and confusion post procedure.  MRI revealed bilateral parietal, left occipital and right cerebellar infarcts along with small amount of basilar cistern subarachnoid hemorrhage.  Evaluated by stroke team and Dr. Pearlean Brownie for scattered bilateral embolic infarcts with SAH and IVH post BA aneurysm repair in setting of cocaine use.  CTA head/neck showed stent from mid basilar artery into the left P2 segment.  CT head showed left basilar cistern SAH stable with trace IVH and repeat CT head decreasing intraventricular blood products.  2D echo showed an EF of 60 to 65% without cardiac source of embolus identified.  Recommended DAPT on discharge due to stent and ongoing management by Dr. Corliss Skains.  History of HTN currently on verapamil 80 mg 3 times daily stable during admission.  He was also previously started on verapamil due to diagnosis of cluster headaches.  LDL 73 and A1c 5.5.  UDS positive for cocaine with discussion regarding importance of cessation.  He was noted to have cocaine withdrawal-induced agitation during admission.  Current tobacco use with smoking cessation counseling provided.  Other stroke risk factors include EtOH use and advise no more than 2 drinks daily and recent diagnosis of HIV.   Other active problems include anxiety/depression, GERD and hypokalemia.  Recommended participation with outpatient therapies but unfortunately patient left AMA on 06/30/2019. He returned to ED on 07/02/2019 with complaints of memory loss and headache.  He underwent CT head which were unremarkable and left without being seen. He returned on 07/03/2019 due to decreased memory, numbness/tingling in hands bilaterally, headaches and "not feeling right".  Evaluated by stroke team and Dr. Roda Shutters with stroke work-up revealing new left cerebellar infarct as evidenced on MRI new from 12/24 in setting of post BA embolization/stenting infarcts noncompliant with medications after discharge.  Advised DAPT and ensure compliance for secondary stroke prevention.  Also advised to restart atorvastatin 40 mg daily with prior LDL 73 for secondary stroke prevention.  Repeat UDS negative.  He was discharged home in stable condition with recommendation of follow-up with Dr. Corliss Skains outpatient for monitoring management.    ROS:   14 system review of systems performed and negative with exception of those listed in HPI  GAD 7 : Generalized Anxiety Score 03/18/2020  Nervous, Anxious, on Edge 3  Control/stop worrying 3  Worry too much - different things 3  Trouble relaxing 3  Restless 3  Easily annoyed or irritable 3  Afraid - awful might happen 3  Total GAD 7 Score 21  Anxiety Difficulty Extremely difficult   Depression screen [facility] 2/9 05/25/2020 03/18/2020 08/12/2019  Decreased Interest 2 3 2   Down, Depressed, Hopeless 2 3 2   PHQ -  2 Score 4 6 4   Altered sleeping 3 3 2   Tired, decreased energy 3 3 3   Change in appetite 1 3 1   Feeling bad or failure about yourself  3 3 3   Trouble concentrating 2 3 3   Moving slowly or fidgety/restless 0 2 3  Suicidal thoughts - 2 0  PHQ-9 Score 16 25 19   Difficult doing work/chores Somewhat difficult - Very difficult      PMH:  Past Medical History:  Diagnosis Date  . Aneurysm  (HCC)   . Anxiety   . Cocaine abuse (HCC)   . Depression   . GERD (gastroesophageal reflux disease)   . Headache   . Stroke (HCC)   . Wears contact lenses     PSH:  Past Surgical History:  Procedure Laterality Date  . IR ANGIO INTRA EXTRACRAN SEL COM CAROTID INNOMINATE BILAT MOD SED  03/05/2019  . IR ANGIO VERTEBRAL SEL VERTEBRAL BILAT MOD SED  03/05/2019  . IR ANGIO VERTEBRAL SEL VERTEBRAL UNI R MOD SED  06/26/2019  . IR ANGIOGRAM FOLLOW UP STUDY  06/26/2019  . IR NEURO EACH ADD'L AFTER BASIC UNI RIGHT (MS)  06/26/2019  . IR TRANSCATH/EMBOLIZ  06/26/2019  . RADIOLOGY WITH ANESTHESIA N/A 06/26/2019   Procedure: EMBOLIZATION;  Surgeon: Julieanne Cottoneveshwar, Sanjeev, MD;  Location: [facility] OR;  Service: Radiology;  Laterality: N/A;  . RECTAL SURGERY    . TOOTH EXTRACTION      Social History:  Social History   Socioeconomic History  . Marital status: Single    Spouse name: Not on file  . Number of children: 0  . Years of education: Not on file  . Highest education level: Some college, no degree  Occupational History  . Occupation: unemployed  Tobacco Use  . Smoking status: Current Every Day Smoker    Packs/day: 0.50    Types: Cigarettes  . Smokeless tobacco: Never Used  Vaping Use  . Vaping Use: Never used  Substance and Sexual Activity  . Alcohol use: Yes    Comment: from time to time  . Drug use: No  . Sexual activity: Not Currently    Birth control/protection: Condom  Other Topics Concern  . Not on file  Social History Narrative   Patient is left-handed. He lives in a 2 level home. He drinks tea 2-3 glasses a day, and coffee occassionally.   Social Determinants of Health   Financial Resource Strain: Not on file  Food Insecurity: Not on file  Transportation Needs: Not on file  Physical Activity: Not on file  Stress: Not on file  Social Connections: Not on file  Intimate Partner Violence: Not on file    Family History:  Family History  Problem Relation Age of Onset  .  Healthy Mother   . Thyroid disease Mother   . Diabetes Father     Medications:   Current Outpatient Medications on File Prior to Visit  Medication Sig Dispense Refill  . aspirin 81 MG chewable tablet Chew 81 mg by mouth daily.    Marland Kitchen. atorvastatin (LIPITOR) 40 MG tablet Take 1 tablet (40 mg total) by mouth daily at 6 PM. (Patient not taking: Reported on 05/25/2020) 30 tablet 0  . bictegravir-emtricitabine-tenofovir AF (BIKTARVY) 50-200-25 MG TABS tablet Take 1 tablet by mouth daily. 30 tablet 5  . clopidogrel (PLAVIX) 75 MG tablet Take 75 mg by mouth daily.  (Patient not taking: Reported on 05/25/2020)    . sertraline (ZOLOFT) 25 MG tablet Take 1 tablet (25 mg  total) by mouth at bedtime. (Patient not taking: Reported on 05/25/2020) 30 tablet 4  . verapamil (CALAN) 80 MG tablet Take 1 tablet (80 mg total) by mouth 3 (three) times daily. (Patient not taking: Reported on 05/25/2020) 90 tablet 3   No current facility-administered medications on file prior to visit.    Allergies:  No Known Allergies   Physical Exam  There were no vitals filed for this visit. There is no height or weight on file to calculate BMI. No exam data present  General: well developed, well nourished, pleasant young African-American male, seated, in no evident distress Head: head normocephalic and atraumatic.   Neck: supple with no carotid or supraclavicular bruits Cardiovascular: regular rate and rhythm, no murmurs Musculoskeletal: no deformity Skin:  no rash/petichiae Vascular:  Normal pulses all extremities   Neurologic Exam Mental Status: Awake and fully alert. Normal speech and language. Oriented to place and time. Recent and remote memory intact. Attention span, concentration and fund of knowledge appropriate. Mood and affect flat and tearful Cranial Nerves: Pupils equal, briskly reactive to light. Extraocular movements full without nystagmus. Visual fields full to confrontation. Hearing intact. Facial  sensation intact. Face, tongue, palate moves normally and symmetrically.  Motor: Normal bulk and tone. Normal strength in all tested extremity muscles. Sensory.: intact to touch , pinprick , position and vibratory sensation.  Coordination: Rapid alternating movements normal in all extremities. Finger-to-nose and heel-to-shin performed accurately bilaterally. Gait and Station: Arises from chair without difficulty. Stance is normal. Gait demonstrates normal stride length and balance Reflexes: 1+ and symmetric. Toes downgoing.       ASSESSMENT/PLAN: Jacob Holder is a 30 y.o. year old male presented with loss of vision, agitation and confusion after embolization with stent placement of right neck basilar aneurysm on 06/26/2019.  Stroke work-up revealed scattered bilateral embolic infarcts (bilateral parietal, left occipital and right cerebellar infarcts) with SAH and IVH.  He returned on 07/03/2019 with memory deficit, continued headache and numbness/tingling hands bilaterally with finding of new left cerebellar infarct secondary to medication noncompliance.  Vascular risk factors include large BA aneurysm with daily headaches s/p embolization with stent 06/26/2019, cocaine use, recent diagnosis of HIV, HTN, HLD, tobacco use, EtOH use, THC use and medication noncompliance.      1. Multiple strokes as above:  a. Recovered well without residual deficits at this time. b. Does report underlying depression worsened post stroke with high PHQ-9 and GAD-7 scores.  Initiate sertraline 25 mg nightly.  Referral placed to psychiatry for further evaluation and medication management. c. Continue aspirin 81 mg daily and clopidogrel 75 mg daily  and atorvastatin for secondary stroke prevention. d. Discussed importance of close PCP follow-up for aggressive stroke risk factor management 2. HTN: BP goal<130/90. Stable today.  Managed by PCP 3. HLD: LDL goal<70.  Continue atorvastatin.  Managed and monitored by  PCP 4. BA aneurysm: Need to schedule follow-up with Dr. Corliss Skains as he missed prior visit.  He is currently waiting until he starts his new job as he is currently uninsured but does plan on following up with them once he has insurance again 5. Newly diagnosed HIV: Advised to schedule follow-up with ID as he missed prior visits    Follow up in 4 months or call earlier if needed   I spent 30 minutes of face-to-face and non-face-to-face time with patient.  This included previsit chart review, lab review, study review, order entry, electronic health record documentation, patient education regarding stroke history, worsening depression post  stroke, importance of managing stroke risk factors and answered all questions to patient satisfaction   Ihor Austin, [facility]  [facility] [address] [place], [place] [address]  Phone [phone] Fax [phone] Note: This document was prepared with digital dictation and possible smart phrase technology. Any transcriptional errors that result from this process are unintentional.

## 2020-07-28 ENCOUNTER — Encounter: Payer: Self-pay | Admitting: Adult Health

## 2020-07-28 ENCOUNTER — Ambulatory Visit: Payer: Self-pay | Admitting: Adult Health

## 2020-08-03 MED FILL — BIKTARVY 50-200-25 MG TABS: 50-200-25 | 30 days supply | Qty: 30 | Fill #0

## 2020-08-28 MED FILL — BIKTARVY 50-200-25 MG TABS: 50-200-25 | 30 days supply | Qty: 30 | Fill #1

## 2020-09-14 ENCOUNTER — Ambulatory Visit: Payer: Self-pay | Admitting: Infectious Diseases

## 2020-09-14 ENCOUNTER — Telehealth: Payer: Self-pay

## 2020-09-14 IMAGING — US IR ANGIO INTRA EXTRACRAN SEL COM CAROTID INNOMINATE BILAT MOD SE
1 series · 1 of 1 positions shown · non-contrast
Comparison: MRI MRA of the brain of 02/22/2019.

CLINICAL DATA: Recent history of worsening headaches. Worsening
early morning headaches. One episode of syncope. Abnormal MRI MRA of
the brain.

EXAM:
BILATERAL COMMON CAROTID AND INNOMINATE ANGIOGRAPHY; IR ANGIO
VERTEBRAL SEL VERTEBRAL BILAT MOD SED
TECHNIQUE: Informed written consent was obtained from the patient after a
thorough discussion of the procedural risks, benefits and
alternatives. All questions were addressed. Maximal Sterile Barrier
Technique was utilized including caps, mask, sterile gowns, sterile
gloves, sterile drape, hand hygiene and skin antiseptic. A timeout
was performed prior to the initiation of the procedure.

[Series 1: ir angio intra extracran sel com carotid innominat · 1 of 1 slices shown]
[im 1/1]
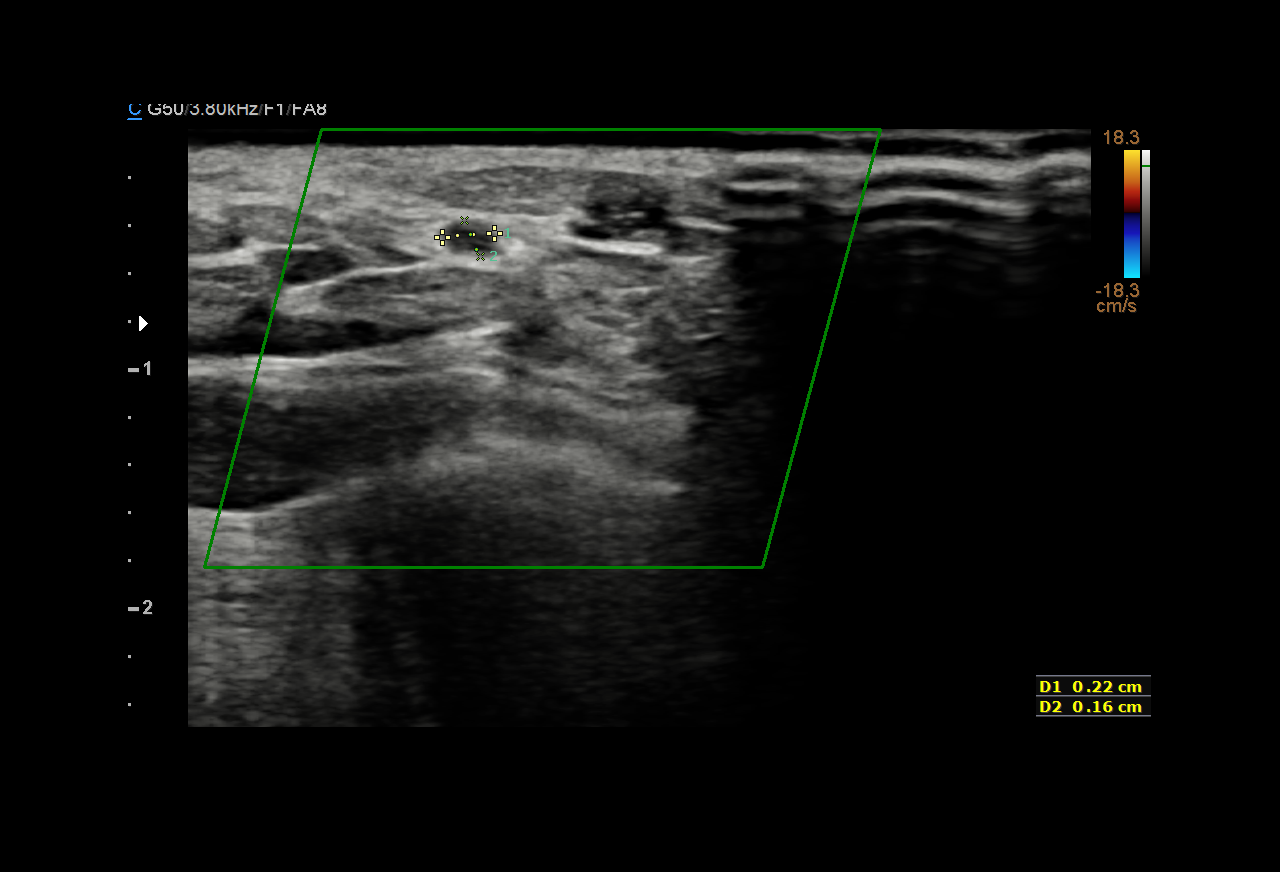

[1 of 1 positions shown; findings below may reference images not displayed]

MEDICATIONS:
Heparin 2222 units IV; no antibiotic was administered within 1 hour
of the procedure.

ANESTHESIA/SEDATION:
Versed 1 mg IV; Fentanyl 25 mcg IV

Moderate Sedation Time:  28 minutes

The patient was continuously monitored during the procedure by the
interventional radiology nurse under my direct supervision.

CONTRAST:  Isovue 300 approximately 60 mL.

FLUOROSCOPY TIME:  Fluoroscopy Time: 8 minutes 42 seconds (948 mGy).

COMPLICATIONS:
None immediate.
The right groin was prepped and draped in the usual sterile fashion.
Thereafter using modified Seldinger technique, transfemoral access
into the right common femoral artery was obtained without
difficulty. Over a 0.035 inch guidewire, a 5 French Pinnacle sheath
was inserted. Through this, and also over 0.035 inch guidewire, a 5
French JB 1 catheter was advanced to the aortic arch region and
selectively positioned in the right common carotid artery, the right
vertebral artery, the left common carotid artery and the left
vertebral artery.
FINDINGS: The right common carotid arteriogram demonstrates the right external
carotid artery and its major branches to be widely patent.

The right internal carotid artery at the bulb to the cranial skull
base demonstrates wide patency.

The petrous, the cavernous and the supraclinoid segments are widely
patent.

A 2 mm infundibulum is seen in the right posterior
communicating/anterior choroidal artery region.

The right middle cerebral artery and the right anterior cerebral
artery opacify into the capillary and venous phases.

There is mild eccentric prominence of the mid right M1 segment.

Prompt flow via the anterior communicating artery of the left
anterior cerebral artery A2 segment and distally is seen.

The right vertebral artery origin is widely patent.

The vessel is seen to opacify to the cranial skull base. Wide
patency is seen of the right vertebrobasilar junction and the right
posterior-inferior cerebellar artery.

The basilar artery, the posterior cerebral arteries, the superior
cerebellar arteries and the anterior-inferior cerebellar arteries
opacify into the capillary and venous phases.

A lobulated aneurysm is seen of the distal basilar apex associated
with patency of the posterior cerebral arteries which appear to
originate from the fundus of this dysplastic aneurysm which measures
approximately 11 mm x 6.1 mm x 6.4 mm.

Inflow of unopacified blood is seen in the basilar artery from the
contralateral vertebral artery.

The left common carotid arteriogram demonstrates the left external
carotid artery and its major branches to be widely patent.

The left internal carotid artery at the bulb to the cranial skull
base demonstrates wide patency. The petrous, cavernous and the
supraclinoid segments are widely patent.

The left middle cerebral artery opacifies into the capillary and
venous phases.

A hypoplastic left anterior cerebral A1 segment is seen contributing
to the left anterior cerebral A2 segment and distally.

The origin of the left vertebral artery is widely patent.

The vessel is seen to opacify to the cranial skull base. Wide
patency is seen of the left vertebrobasilar junction and the left
posterior-inferior cerebellar artery.

The distal vertebrobasilar junction joins the contralateral
vertebrobasilar junction to form the basilar artery. The opacified
portion of the basilar artery, the posterior cerebral arteries, the
superior cerebellar arteries and the anterior-inferior cerebellar
arteries is noted into the capillary and venous phases. Again
demonstrated is a dysplastic prominent aneurysm of the basilar apex
measuring approximately 11 mm x 6.1 mm x 6.4 mm.
IMPRESSION: Approximately 11 mm x 6.1 mm x 6.4 mm dysplastic lobulated aneurysm
at the basilar artery apex. Both posterior cerebral arteries arising
from fundus of the lobulated aneurysm.

PLAN:
Consult to discuss management and treatment options of the lobulated
basilar apex aneurysm in the next few days.

## 2020-09-14 NOTE — Telephone Encounter (Signed)
I called patient in regards to his missed appointment. Patient did not answer and I left a voicemail for patient to call back to reschedule his appointment. Jacob Holder T Pricilla Loveless

## 2020-09-29 ENCOUNTER — Other Ambulatory Visit (HOSPITAL_COMMUNITY): Payer: Self-pay

## 2020-10-05 ENCOUNTER — Other Ambulatory Visit (HOSPITAL_COMMUNITY): Payer: Self-pay

## 2020-10-05 MED FILL — Bictegravir-Emtricitabine-Tenofovir AF Tab 50-200-25 MG: ORAL | 30 days supply | Qty: 30 | Fill #0 | Status: CN

## 2020-10-06 ENCOUNTER — Ambulatory Visit: Payer: Self-pay

## 2020-10-06 ENCOUNTER — Other Ambulatory Visit: Payer: Self-pay

## 2020-10-06 ENCOUNTER — Encounter: Payer: Self-pay | Admitting: Infectious Diseases

## 2020-10-07 ENCOUNTER — Ambulatory Visit: Payer: Self-pay | Admitting: Infectious Diseases

## 2020-10-08 ENCOUNTER — Other Ambulatory Visit: Payer: Self-pay

## 2020-10-08 ENCOUNTER — Ambulatory Visit (INDEPENDENT_AMBULATORY_CARE_PROVIDER_SITE_OTHER): Payer: Self-pay | Admitting: Infectious Diseases

## 2020-10-08 ENCOUNTER — Encounter: Payer: Self-pay | Admitting: Infectious Diseases

## 2020-10-08 VITALS — BP 102/71 | HR 83 | Temp 98.1°F | Wt 173.0 lb

## 2020-10-08 DIAGNOSIS — Z23 Encounter for immunization: Secondary | ICD-10-CM

## 2020-10-08 DIAGNOSIS — F32A Depression, unspecified: Secondary | ICD-10-CM | POA: Insufficient documentation

## 2020-10-08 DIAGNOSIS — I6389 Other cerebral infarction: Secondary | ICD-10-CM

## 2020-10-08 DIAGNOSIS — F4321 Adjustment disorder with depressed mood: Secondary | ICD-10-CM | POA: Insufficient documentation

## 2020-10-08 DIAGNOSIS — B20 Human immunodeficiency virus [HIV] disease: Secondary | ICD-10-CM

## 2020-10-08 NOTE — Patient Instructions (Signed)
Nice to see you today. Keep taking your biktarvy everyday.   Please stop by the lab on your way out to update your blood work.   If you need help logging in or resetting your MyChart password we can help you with that.   We gave you your first dose of Hepatitis A and B vaccines today. Please schedule a nurse visit in 8 weeks for your second dose of Hepatittis B vaccine.   I would like to see you back in 3 months - we can do your labs same day if that is easier for you and get you to see financial team then as well to re-apply your insurance.

## 2020-10-08 NOTE — Assessment & Plan Note (Signed)
Doing well with Biktarvy adherence. We reviewed historical labs today to highlight how well he has been doing. Will check pertinent labs today. Offered STI screening but politely declined.  Vaccines for hepatitis A/B #1 today - he will return in 8 weeks for final hep b and 6 months for last hep A.  Return in about 3 months (around 01/07/2021).

## 2020-10-08 NOTE — Progress Notes (Signed)
Name: Jacob Holder  DOB: [birth year] MRN: [id number] PCP: Jacob Mccreedy, MD     Brief Narrative:  Jacob Holder is a 30 y.o. male with HIV Dx diagnosed recently 07/2019 during hospital screening with stroke.  CD4 nadir 478 VL 76,200 HIV Risk: MSM History of OIs: none Intake Labs 07/2019: Hep B sAg (-), sAb (-), cAb (-); Hep A (-), Hep C (-) Quantiferon (-) HLA B*5701 (-) G6PD: ()   Previous Regimens: . Biktarvy 2021  Genotypes: . 07/2019 - wildtype  Subjective:   Chief Complaint  Patient presents with  . Follow-up    B20      HPI: Doing "so so" today. Has lost a lot of family members recently and has not quite worked through the grief of these losses yet. Feels like he is ignoring it for now to get through the day by day. Has another funeral today.   He feels like his HIV treatment has been the only thing that has been stable for him. No concern over access or side effect with Biktarvy. Takes it everyday at the same time.   Started a new job at [employer] and enjoys working out of the house again.    Depression screen PHQ 2/9 10/08/2020  Decreased Interest 2  Down, Depressed, Hopeless 2  PHQ - 2 Score 4  Altered sleeping 3  Tired, decreased energy 0  Change in appetite 0  Feeling bad or failure about yourself  1  Trouble concentrating 1  Moving slowly or fidgety/restless 1  Suicidal thoughts 0  PHQ-9 Score 10  Difficult doing work/chores Not difficult at all    Review of Systems  Constitutional: Negative for chills, fever, malaise/fatigue and weight loss.  HENT: Negative for sore throat.   Respiratory: Negative for cough, sputum production and shortness of breath.   Cardiovascular: Negative.   Gastrointestinal: Negative for abdominal pain, diarrhea and vomiting.  Musculoskeletal: Negative for joint pain, myalgias and neck pain.  Skin: Negative for rash.  Neurological: Negative for headaches.  Psychiatric/Behavioral: Positive for depression.  Negative for substance abuse. The patient is not nervous/anxious.     Past Medical History:  Diagnosis Date  . Aneurysm ([place])   . Anxiety   . Cocaine abuse ([place])   . Depression   . GERD (gastroesophageal reflux disease)   . Headache   . Stroke ([place])   . Wears contact lenses     Outpatient Medications Prior to Visit  Medication Sig Dispense Refill  . aspirin 81 MG chewable tablet Chew 81 mg by mouth daily.    Marland Kitchen atorvastatin (LIPITOR) 40 MG tablet Take 1 tablet (40 mg total) by mouth daily at 6 PM. 30 tablet 0  . bictegravir-emtricitabine-tenofovir AF (BIKTARVY) 50-200-25 MG TABS tablet TAKE 1 TABLET BY MOUTH DAILY. 30 tablet 5  . clopidogrel (PLAVIX) 75 MG tablet Take 75 mg by mouth daily.    . sertraline (ZOLOFT) 25 MG tablet Take 1 tablet (25 mg total) by mouth at bedtime. 30 tablet 4  . verapamil (CALAN) 80 MG tablet Take 1 tablet (80 mg total) by mouth 3 (three) times daily. 90 tablet 3   No facility-administered medications prior to visit.     No Known Allergies  Social History   Tobacco Use  . Smoking status: Current Every Day Smoker    Packs/day: 0.50    Types: Cigarettes  . Smokeless tobacco: Never Used  Vaping Use  . Vaping Use: Never used  Substance Use Topics  . Alcohol use: Yes  Comment: from time to time  . Drug use: No    Social History   Substance and Sexual Activity  Sexual Activity Not Currently  . Birth control/protection: Condom   Comment: declined condoms 10/08/20     Objective:   Vitals:   10/08/20 1041  BP: 102/71  Pulse: 83  Temp: 98.1 F (36.7 C)  TempSrc: Oral  Weight: 173 lb (78.5 kg)   Body mass index is 24.13 kg/m.  Physical Exam Constitutional:      Appearance: He is well-developed.     Comments: Seated comfortably in chair during visit.   HENT:     Mouth/Throat:     Dentition: Normal dentition. No dental abscesses.  Cardiovascular:     Rate and Rhythm: Normal rate and regular rhythm.     Heart sounds: Normal  heart sounds.  Pulmonary:     Effort: Pulmonary effort is normal.     Breath sounds: Normal breath sounds.  Abdominal:     General: There is no distension.     Palpations: Abdomen is soft.     Tenderness: There is no abdominal tenderness.  Lymphadenopathy:     Cervical: No cervical adenopathy.  Skin:    General: Skin is warm and dry.     Findings: No rash.  Neurological:     Mental Status: He is alert and oriented to person, place, and time.  Psychiatric:        Judgment: Judgment normal.     Comments: Depressed mood today     Lab Results Lab Results  Component Value Date   WBC 6.6 10/08/2020   HGB 13.8 10/08/2020   HCT 43.0 10/08/2020   MCV 86.5 10/08/2020   PLT 183 10/08/2020    Lab Results  Component Value Date   CREATININE 0.90 11/06/2019   BUN 7 11/06/2019   NA 137 11/06/2019   K 4.1 11/06/2019   CL 103 11/06/2019   CO2 26 11/06/2019    Lab Results  Component Value Date   ALT 16 11/06/2019   AST 17 11/06/2019   ALKPHOS 57 07/03/2019   BILITOT 0.4 11/06/2019    Lab Results  Component Value Date   CHOL 90 07/31/2019   HDL 29 (L) 07/31/2019   LDLCALC 47 07/31/2019   TRIG 61 07/31/2019   CHOLHDL 3.1 07/31/2019   HIV 1 RNA Quant  Date Value  05/25/2020 <20 Copies/mL  11/06/2019 36 copies/mL (H)  07/31/2019 76,200 copies/mL (H)   CD4 T Cell Abs (/uL)  Date Value  05/25/2020 687  11/06/2019 574  07/31/2019 478     Assessment & Plan:   Problem List Items Addressed This Visit      Unprioritized   Stroke ([place])   HIV disease ([place]) - Primary (Chronic)    Doing well with Biktarvy adherence. We reviewed historical labs today to highlight how well he has been doing. Will check pertinent labs today. Offered STI screening but politely declined.  Vaccines for hepatitis A/B #1 today - he will return in 8 weeks for final hep b and 6 months for last hep A.  Return in about 3 months (around 01/07/2021).      Relevant Orders   RPR   T-helper cell (CD4)-  ([facility] clinic only)   CBC with Differential/Platelet (Completed)   COMPLETE METABOLIC PANEL WITH GFR   HIV-1 RNA quant-no reflex-bld   Urine cytology ancillary only   Grief reaction    Allowed in opportunity for space to freely discuss increased stressors  and depression around grief.  Emotional support given in clinic today.  Discussed that we have some counselors should he need some help with prolonged grief reaction.        Other Visit Diagnoses    Need for hepatitis A immunization       Relevant Orders   Hepatitis A vaccine adult IM (Completed)   Need for hepatitis B vaccination       Relevant Orders   Heplisav-B (HepB-CPG) Vaccine (Completed)      Janene Madeira, MSN, NP-C [facility] Pager: [phone] Office: [phone]  10/08/20  10:16 PM

## 2020-10-08 NOTE — Assessment & Plan Note (Signed)
Allowed in opportunity for space to freely discuss increased stressors and depression around grief.  Emotional support given in clinic today.  Discussed that we have some counselors should he need some help with prolonged grief reaction.

## 2020-10-09 LAB — T-HELPER CELL (CD4) - (RCID CLINIC ONLY)
CD4 % Helper T Cell: 31 % — ABNORMAL LOW (ref 33–65)
CD4 T Cell Abs: 789 /uL (ref 400–1790)

## 2020-10-11 LAB — COMPLETE METABOLIC PANEL WITH GFR
AG Ratio: 1.2 (calc) (ref 1.0–2.5)
ALT: 50 U/L — ABNORMAL HIGH (ref 9–46)
AST: 29 U/L (ref 10–40)
Albumin: 4.3 g/dL (ref 3.6–5.1)
Alkaline phosphatase (APISO): 66 U/L (ref 36–130)
BUN: 10 mg/dL (ref 7–25)
CO2: 24 mmol/L (ref 20–32)
Calcium: 9.1 mg/dL (ref 8.6–10.3)
Chloride: 108 mmol/L (ref 98–110)
Creat: 0.81 mg/dL (ref 0.60–1.35)
GFR, Est African American: 137 mL/min/{1.73_m2} (ref >=60)
GFR, Est Non African American: 118 mL/min/{1.73_m2} (ref >=60)
Globulin: 3.5 g/dL (calc) (ref 1.9–3.7)
Glucose, Bld: 93 mg/dL (ref 65–99)
Potassium: 3.9 mmol/L (ref 3.5–5.3)
Sodium: 139 mmol/L (ref 135–146)
Total Bilirubin: 0.4 mg/dL (ref 0.2–1.2)
Total Protein: 7.8 g/dL (ref 6.1–8.1)

## 2020-10-11 LAB — CBC WITH DIFFERENTIAL/PLATELET
Absolute Monocytes: 515 cells/uL (ref 200–950)
Basophils Absolute: 59 cells/uL (ref 0–200)
Basophils Relative: 0.9 %
Eosinophils Absolute: 198 cells/uL (ref 15–500)
Eosinophils Relative: 3 %
HCT: 43 % (ref 38.5–50.0)
Hemoglobin: 13.8 g/dL (ref 13.2–17.1)
Lymphs Abs: 2673 cells/uL (ref 850–3900)
MCH: 27.8 pg (ref 27.0–33.0)
MCHC: 32.1 g/dL (ref 32.0–36.0)
MCV: 86.5 fL (ref 80.0–100.0)
MPV: 12.2 fL (ref 7.5–12.5)
Monocytes Relative: 7.8 %
Neutro Abs: 3155 cells/uL (ref 1500–7800)
Neutrophils Relative %: 47.8 %
Platelets: 183 10*3/uL (ref 140–400)
RBC: 4.97 10*6/uL (ref 4.20–5.80)
RDW: 12.7 % (ref 11.0–15.0)
Total Lymphocyte: 40.5 %
WBC: 6.6 10*3/uL (ref 3.8–10.8)

## 2020-10-11 LAB — HIV-1 RNA QUANT-NO REFLEX-BLD
HIV 1 RNA Quant: 67 Copies/mL — ABNORMAL HIGH
HIV-1 RNA Quant, Log: 1.82 Log cps/mL — ABNORMAL HIGH

## 2020-10-11 LAB — RPR: RPR Ser Ql: NONREACTIVE

## 2020-10-29 ENCOUNTER — Other Ambulatory Visit (HOSPITAL_COMMUNITY): Payer: Self-pay

## 2020-12-02 ENCOUNTER — Other Ambulatory Visit (HOSPITAL_COMMUNITY): Payer: Self-pay

## 2020-12-14 ENCOUNTER — Other Ambulatory Visit: Payer: Self-pay

## 2020-12-14 ENCOUNTER — Ambulatory Visit (HOSPITAL_COMMUNITY)
Admission: EM | Admit: 2020-12-14 | Discharge: 2020-12-14 | Disposition: A | Discharge status: Home / Self Care | Payer: Self-pay

## 2020-12-14 ENCOUNTER — Emergency Department (HOSPITAL_BASED_OUTPATIENT_CLINIC_OR_DEPARTMENT_OTHER)
Admission: EM | Admit: 2020-12-14 | Discharge: 2020-12-14 | Disposition: A | Discharge status: Home / Self Care | Payer: Self-pay | Source: Home / Self Care

## 2020-12-14 ENCOUNTER — Encounter (HOSPITAL_BASED_OUTPATIENT_CLINIC_OR_DEPARTMENT_OTHER): Payer: Self-pay | Admitting: *Deleted

## 2020-12-14 ENCOUNTER — Encounter (HOSPITAL_COMMUNITY): Payer: Self-pay

## 2020-12-14 ENCOUNTER — Emergency Department (HOSPITAL_COMMUNITY)
Admission: EM | Admit: 2020-12-14 | Discharge: 2020-12-14 | Disposition: A | Discharge status: Home / Self Care | Payer: Self-pay | Attending: Emergency Medicine | Admitting: Emergency Medicine

## 2020-12-14 DIAGNOSIS — R519 Headache, unspecified: Secondary | ICD-10-CM | POA: Insufficient documentation

## 2020-12-14 DIAGNOSIS — M25511 Pain in right shoulder: Secondary | ICD-10-CM | POA: Insufficient documentation

## 2020-12-14 DIAGNOSIS — M79621 Pain in right upper arm: Secondary | ICD-10-CM | POA: Insufficient documentation

## 2020-12-14 DIAGNOSIS — Y92410 Unspecified street and highway as the place of occurrence of the external cause: Secondary | ICD-10-CM | POA: Insufficient documentation

## 2020-12-14 DIAGNOSIS — Z5321 Procedure and treatment not carried out due to patient leaving prior to being seen by health care provider: Secondary | ICD-10-CM | POA: Insufficient documentation

## 2020-12-14 DIAGNOSIS — M79601 Pain in right arm: Secondary | ICD-10-CM | POA: Insufficient documentation

## 2020-12-14 DIAGNOSIS — R0781 Pleurodynia: Secondary | ICD-10-CM | POA: Insufficient documentation
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
## 2020-12-14 NOTE — ED Notes (Signed)
Pt come to the desk and stated he will follow up with [facility] and wellness tomorrow since he has been 2 places today. He is in agreement to sign AMA and aware to return if symptoms worsen. Pt verbalizes understanding.
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
## 2021-01-06 IMAGING — MR MR HEAD W/O CM
12 of 13 series · 43 of 48 positions shown · non-contrast
Comparison: CTA head and neck yesterday.  Brain MRI 02/22/2019.
COMPARISON: CTA head and neck yesterday.  Brain MRI 02/22/2019.

Addendum:
CLINICAL DATA: 28-year-old male with loss of vision after MATTHYS
procedure, stent placed across basilar tip aneurysm.

EXAM:
MRI HEAD WITHOUT CONTRAST
TECHNIQUE: Multiplanar, multiecho pulse sequences of the brain and surrounding
structures were obtained without intravenous contrast.

[Series 5: DWI · axial · 3.0mm · 0.88mm/px · z∈[-96,+43]mm · 6 of 96 slices shown (1 of 4)]
[im 1/96]
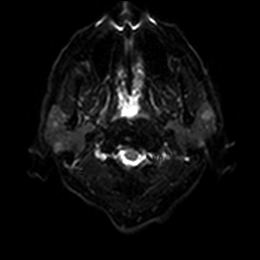
[im 20/96]
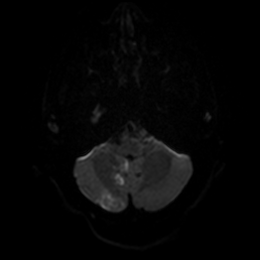
[im 39/96]
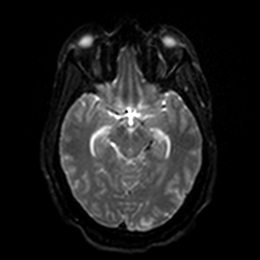
[im 58/96]
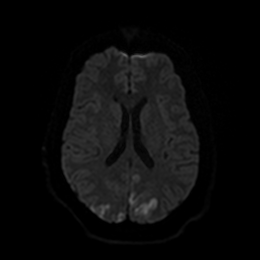
[im 77/96]
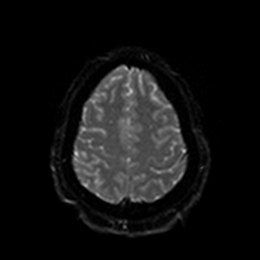
[im 96/96]
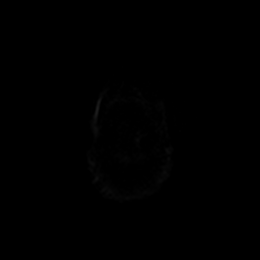

[Series 6: DWI · axial · 3.0mm · 0.88mm/px · z∈[-96,+43]mm · 3 of 48 slices shown (2 of 4)]
[im 1/48]
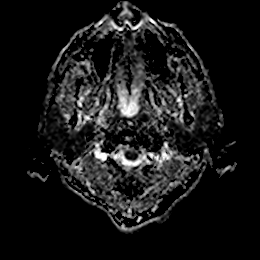
[im 24/48]
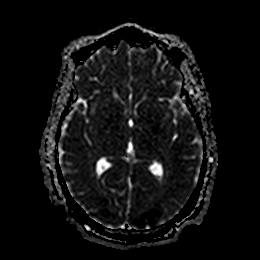
[im 48/48]
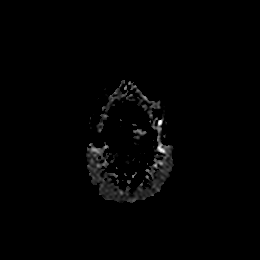

[Series 7: DWI · coronal · 4.0mm · 0.88mm/px · 5 of 70 slices shown (3 of 4)]
[im 1/70]
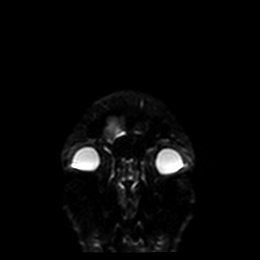
[im 18/70]
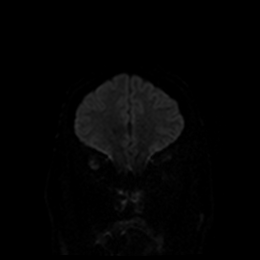
[im 35/70]
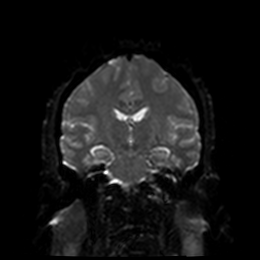
[im 52/70]
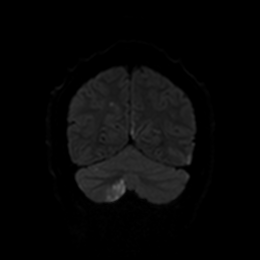
[im 70/70]
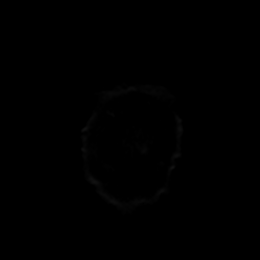

[Series 8: DWI · coronal · 4.0mm · 0.88mm/px · 3 of 35 slices shown (4 of 4)]
[im 1/35]
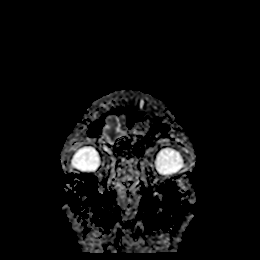
[im 18/35]
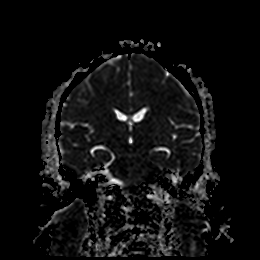
[im 35/35]
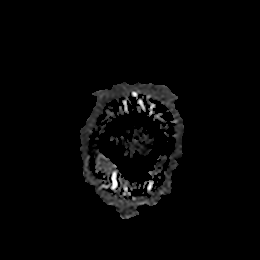

[Series 9: T1 · sagittal · 5.0mm · 0.75mm/px · 2 of 23 slices shown]
[im 1/23]
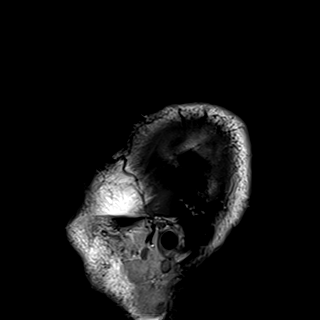
[im 23/23]
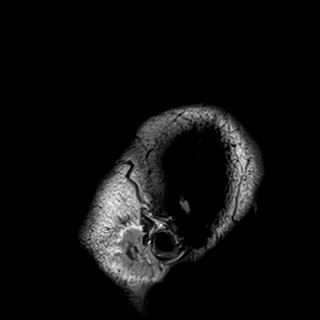

[Series 10: T2 · axial · 5.0mm · 0.72mm/px · z∈[-99,+43]mm · 2 of 25 slices shown (1 of 2)]
[im 1/25]
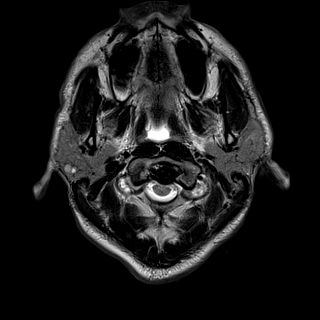
[im 25/25]
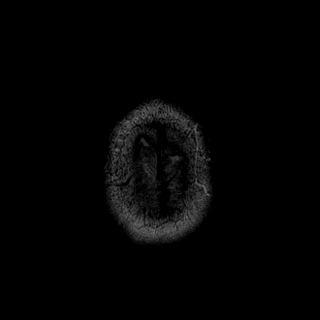

[Series 11: FLAIR · axial · 5.0mm · 0.45mm/px · z∈[-98,+43]mm · 2 of 25 slices shown]
[im 1/25]
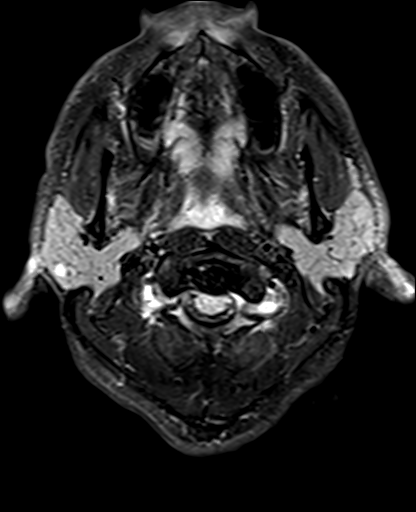
[im 25/25]
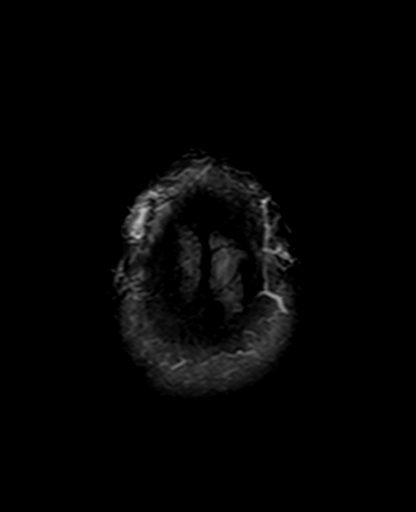

[Series 12: mag_images · axial · 3.0mm · 0.90mm/px · z∈[-108,+66]mm · 5 of 60 slices shown]
[im 1/60]
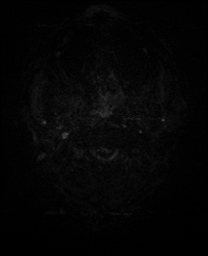
[im 15/60]
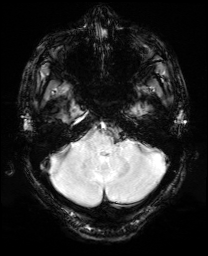
[im 30/60]
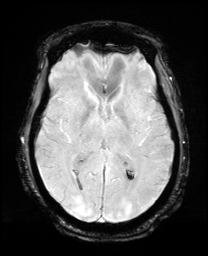
[im 45/60]
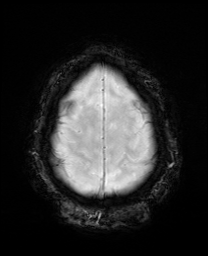
[im 60/60]
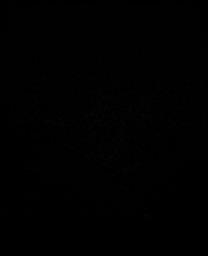

[Series 13: pha_images · axial · 3.0mm · 0.90mm/px · z∈[-105,+48]mm · 4 of 53 slices shown]
[im 1/53]
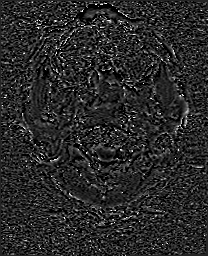
[im 18/53]
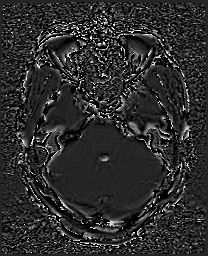
[im 35/53]
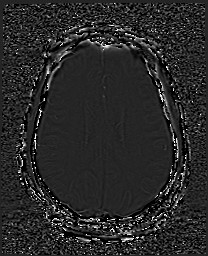
[im 53/53]
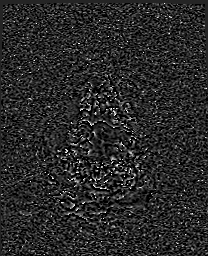

[Series 14: swi_images · axial · 3.0mm · 0.90mm/px · z∈[-108,+66]mm · 5 of 60 slices shown]
[im 1/60]
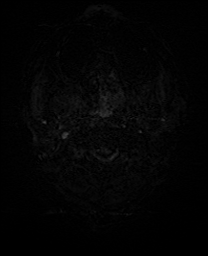
[im 15/60]
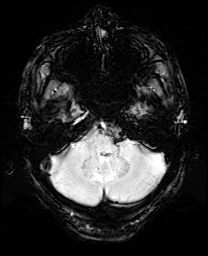
[im 30/60]
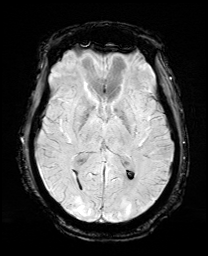
[im 45/60]
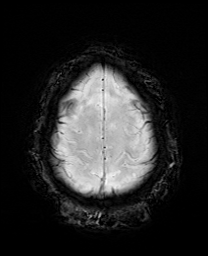
[im 60/60]
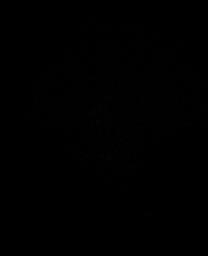

[Series 15: mip_images(sw) · axial · 24.0mm · 0.90mm/px · z∈[-98,+56]mm · 4 of 53 slices shown]
[im 1/53]
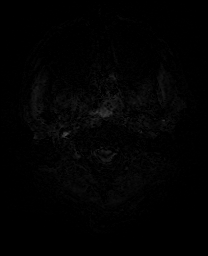
[im 18/53]
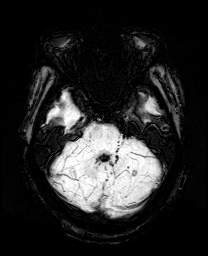
[im 35/53]
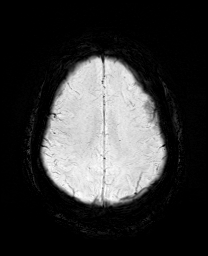
[im 53/53]
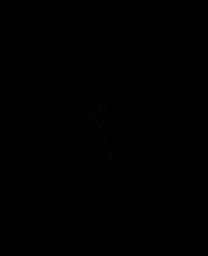

[Series 17: T2 · coronal · 5.0mm · 0.34mm/px · 2 of 29 slices shown (2 of 2)]
[im 1/29]
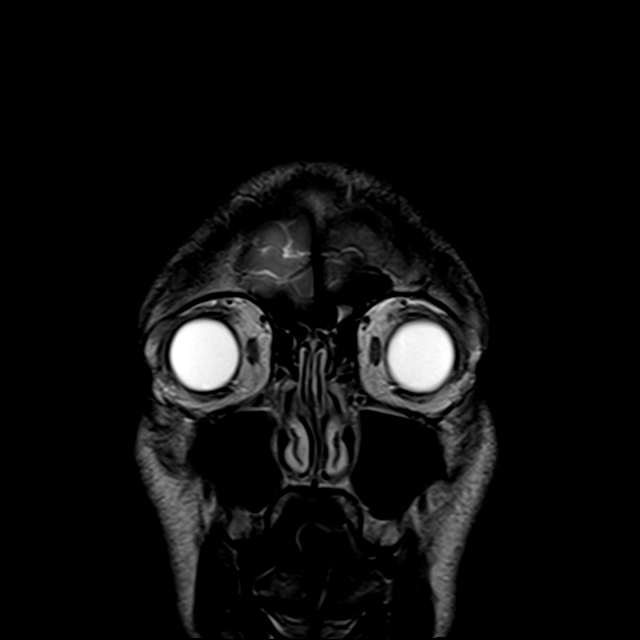
[im 29/29]
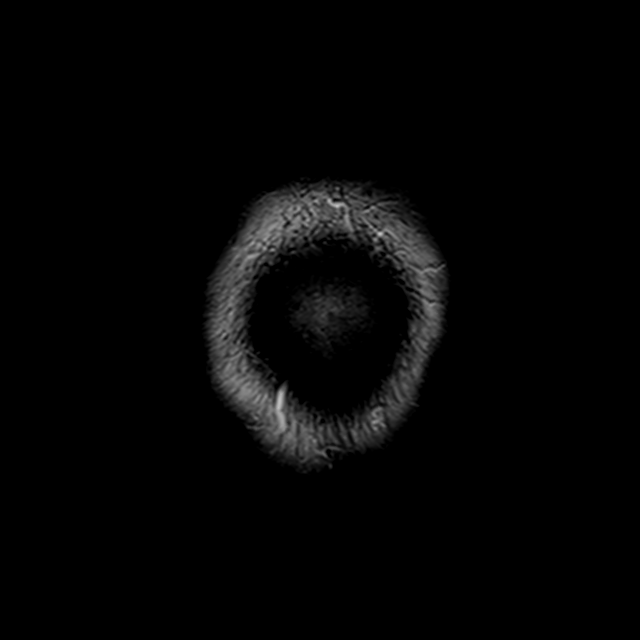

[43 of 48 positions shown; findings below may reference images not displayed]

FINDINGS: Brain: There is new high FLAIR signal and intermediate T1 signal in
the basilar cisterns and posterior fossa suspicious for subarachnoid
hemorrhage, including in the cisterna magna. Component of subdural
blood is felt less likely.

And there is associated intraventricular hemorrhage layering in the
occipital horns, also the 4th ventricle.

No superimposed ventriculomegaly. No midline shift or significant
intracranial mass effect.

Patchy areas of cortical and white matter restricted diffusion in
both posterior parietal lobes, superior occipital lobes, the left
occipital pole, left lateral occipital lobe, and a small area of the
anterior and medial occipital lobe. Superimposed similar patchy
restricted diffusion in the right cerebellum including the right
tonsil. Cytotoxic edema with no parenchymal blood identified.

No definite superimposed brainstem restricted diffusion. No thalamic
restricted diffusion. No anterior circulation restricted diffusion
identified. Stable gray and white matter signal in the anterior
circulation.

Vascular: Major intracranial vascular flow voids appear stable
compared to Jim. Basilar through left PCA stent better
demonstrated by CTA.

Skull and upper cervical spine: No definite hemorrhage in the
visible upper cervical spine. Bone marrow signal remains normal.

Sinuses/Orbits: Intubated. Fluid in the pharynx. Paranasal sinuses
remain well pneumatized. Negative orbits.

Other: Mastoids remain clear.
IMPRESSION: 1. Basilar cistern subarachnoid hemorrhage and small volume
intraventricular hemorrhage appears new from the CTA yesterday.
Difficult to exclude a component of subdural hematoma at the foramen
magnum.

2. No ventriculomegaly or significant intracranial mass effect at
this time.

3. Scattered acute infarcts in the bilateral parietal, left
occipital lobes, and right cerebellum. No brainstem or anterior
circulation involvement identified. No intra-axial hemorrhage
identified.

4. Major intracranial vascular flow voids appear stable.

Dr. Ariuna with Neurology was paged via AMION regarding the

ADDENDUM:
Study discussed by telephone with Dr. Maimi Tamani on 06/27/2019 at
0787 hours.

*** End of Addendum ***
FINDINGS: Brain: There is new high FLAIR signal and intermediate T1 signal in
the basilar cisterns and posterior fossa suspicious for subarachnoid
hemorrhage, including in the cisterna magna. Component of subdural
blood is felt less likely.

And there is associated intraventricular hemorrhage layering in the
occipital horns, also the 4th ventricle.

No superimposed ventriculomegaly. No midline shift or significant
intracranial mass effect.

Patchy areas of cortical and white matter restricted diffusion in
both posterior parietal lobes, superior occipital lobes, the left
occipital pole, left lateral occipital lobe, and a small area of the
anterior and medial occipital lobe. Superimposed similar patchy
restricted diffusion in the right cerebellum including the right
tonsil. Cytotoxic edema with no parenchymal blood identified.

No definite superimposed brainstem restricted diffusion. No thalamic
restricted diffusion. No anterior circulation restricted diffusion
identified. Stable gray and white matter signal in the anterior
circulation.

Vascular: Major intracranial vascular flow voids appear stable
compared to Jim. Basilar through left PCA stent better
demonstrated by CTA.

Skull and upper cervical spine: No definite hemorrhage in the
visible upper cervical spine. Bone marrow signal remains normal.

Sinuses/Orbits: Intubated. Fluid in the pharynx. Paranasal sinuses
remain well pneumatized. Negative orbits.

Other: Mastoids remain clear.
IMPRESSION: 1. Basilar cistern subarachnoid hemorrhage and small volume
intraventricular hemorrhage appears new from the CTA yesterday.
Difficult to exclude a component of subdural hematoma at the foramen
magnum.

2. No ventriculomegaly or significant intracranial mass effect at
this time.

3. Scattered acute infarcts in the bilateral parietal, left
occipital lobes, and right cerebellum. No brainstem or anterior
circulation involvement identified. No intra-axial hemorrhage
identified.

4. Major intracranial vascular flow voids appear stable.

Dr. Ariuna with Neurology was paged via AMION regarding the

## 2021-01-07 ENCOUNTER — Encounter: Payer: Self-pay | Admitting: Infectious Diseases

## 2021-01-07 ENCOUNTER — Other Ambulatory Visit: Payer: Self-pay

## 2021-01-07 ENCOUNTER — Ambulatory Visit (INDEPENDENT_AMBULATORY_CARE_PROVIDER_SITE_OTHER): Payer: Self-pay | Admitting: Infectious Diseases

## 2021-01-07 ENCOUNTER — Ambulatory Visit: Payer: Self-pay

## 2021-01-07 VITALS — BP 111/73 | HR 85 | Temp 98.1°F | Ht 71.0 in | Wt 167.8 lb

## 2021-01-07 DIAGNOSIS — B20 Human immunodeficiency virus [HIV] disease: Secondary | ICD-10-CM

## 2021-01-07 DIAGNOSIS — F32A Depression, unspecified: Secondary | ICD-10-CM

## 2021-01-07 MED ORDER — SERTRALINE HCL 25 MG PO TABS: 25.0000 mg | ORAL_TABLET | Freq: Every day | ORAL | 0 refills | Status: DC

## 2021-01-07 MED ORDER — CABOTEGRAVIR & RILPIVIRINE ER 600 & 900 MG/3ML IM SUER: 1.0000 | Freq: Once | INTRAMUSCULAR | 0 refills | Status: AC

## 2021-01-07 NOTE — Patient Instructions (Addendum)
Melatonin 5 mg capsules around 6-7 pm to help you stay asleep.   Stop by the lab on your way out. We want to make sure your viral load is low enough to proceed with the shots for treatment. If it is too high we need to wait a few weeks.   Please continue your Biktarvy until next week so we can switch you to the CABENUVA injections.  You will receive your first round of injections next week, THEN come back in 30 days to get your next dose. THEN it will be every 2 months from there.   Sertraline Tablets What is this medication? SERTRALINE (SER tra leen) treats depression, anxiety, obsessive-compulsive disorder (OCD), post-traumatic stress disorder (PTSD), and premenstrual dysphoric disorder (PMDD). It increases the amount of serotonin in the brain, a hormone that helps regulate mood. It belongs to a group of medications calledSSRIs. This medicine may be used for other purposes; ask your health care provider orpharmacist if you have questions. COMMON BRAND NAME(S): Zoloft What should I tell my care team before I take this medication? They need to know if you have any of these conditions: Bleeding disorders Bipolar disorder or a family history of bipolar disorder Glaucoma Heart disease High blood pressure History of irregular heartbeat History of low levels of calcium, magnesium, or potassium in the blood If you often drink alcohol Liver disease Receiving electroconvulsive therapy Seizures Suicidal thoughts, plans, or attempt; a previous suicide attempt by you or a family member Take medications that treat or prevent blood clots Thyroid disease An unusual or allergic reaction to sertraline, other medications, foods, dyes, or preservatives Pregnant or trying to get pregnant Breast-feeding How should I use this medication? Take this medication by mouth with a glass of water. Follow the directions on the prescription label. You can take it with or without food. Take your medication at  regular intervals. Do not take your medication more often than directed. Do not stop taking this medication suddenly except upon the advice of your care team. Stopping this medication too quickly may cause serious sideeffects or your condition may worsen. A special MedGuide will be given to you by the pharmacist with eachprescription and refill. Be sure to read this information carefully each time. Talk to your care team about the use of this medication in children. While this medication may be prescribed for children as young as 7 years for selectedconditions, precautions do apply. Overdosage: If you think you have taken too much of this medicine contact apoison control center or emergency room at once. NOTE: This medicine is only for you. Do not share this medicine with others. What if I miss a dose? If you miss a dose, take it as soon as you can. If it is almost time for yournext dose, take only that dose. Do not take double or extra doses. What may interact with this medication? Do not take this medication with any of the following: Cisapride Dronedarone Linezolid MAOIs like Carbex, Eldepryl, Marplan, Nardil, and Parnate Methylene blue (injected into a vein) Pimozide Thioridazine This medication may also interact with the following: Alcohol Amphetamines Aspirin and aspirin-like medications Certain medications for depression, anxiety, or psychotic disturbances Certain medications for fungal infections like ketoconazole, fluconazole, posaconazole, and itraconazole Certain medications for irregular heart beat like flecainide, quinidine, propafenone Certain medications for migraine headaches like almotriptan, eletriptan, frovatriptan, naratriptan, rizatriptan, sumatriptan, zolmitriptan Certain medications for sleep Certain medications for seizures like carbamazepine, valproic acid, phenytoin Certain medications that treat or prevent blood clots  like warfarin, enoxaparin,  dalteparin Cimetidine Digoxin Diuretics Fentanyl Isoniazid Lithium NSAIDs, medications for pain and inflammation, like ibuprofen or naproxen Other medications that prolong the QT interval (cause an abnormal heart rhythm) like dofetilide Rasagiline Safinamide Supplements like St. John's wort, kava kava, valerian Tolbutamide Tramadol Tryptophan This list may not describe all possible interactions. Give your health care provider a list of all the medicines, herbs, non-prescription drugs, or dietary supplements you use. Also tell them if you smoke, drink alcohol, or use illegaldrugs. Some items may interact with your medicine. What should I watch for while using this medication? Tell your health care provider if your symptoms do not get better or if they get worse. Visit your health care provider for regular checks on your progress. Because it may take several weeks to see the full effects of this medication, it is important to continue your treatment as prescribed by your health careprovider. Patients and their families should watch out for new or worsening thoughts of suicide or depression. Also watch out for sudden changes in feelings such as feeling anxious, agitated, panicky, irritable, hostile, aggressive, impulsive, severely restless, overly excited and hyperactive, or not being able to sleep. If this happens, especially at the beginning of treatment or after a change indose, call your health care provider. You may get drowsy or dizzy. Do not drive, use machinery, or do anything that needs mental alertness until you know how this medication affects you. Do not stand or sit up quickly, especially if you are an older patient. This reduces the risk of dizzy or fainting spells. Alcohol may interfere with the effect ofthis medication. Your mouth may get dry. Chewing sugarless gum or sucking hard candy, and drinking plenty of water may help. Contact your health care provider if theproblem does not  go away or is severe. What side effects may I notice from receiving this medication? Side effects that you should report to your care team as soon as possible: Allergic reactions-skin rash, itching, hives, swelling of the face, lips, tongue, or throat Bleeding-bloody or black, tar-like stools, red or dark brown urine, vomiting blood or brown material that looks like coffee grounds, small red or purple spots on skin, unusual bleeding or bruising Heart rhythm changes-fast or irregular heartbeat, dizziness, feeling faint or lightheaded, chest pain, trouble breathing Low sodium level-muscle weakness, fatigue, dizziness, headache, confusion Serotonin syndrome-irritability, confusion, fast or irregular heartbeat, muscle stiffness, twitching muscles, sweating, high fever, seizure, chills, vomiting, diarrhea Sudden eye pain or change in vision such as blurred vision, seeing halos around lights, vision loss Thoughts of suicide or self-harm, worsening mood Side effects that usually do not require medical attention (report these toyour care team if they continue or are bothersome): Change in sex drive or performance Diarrhea Excessive sweating Nausea Tremors or shaking Upset stomach This list may not describe all possible side effects. Call your doctor for medical advice about side effects. You may report side effects to FDA at1-800-FDA-1088. Where should I keep my medication? Keep out of the reach of children and pets. Store at room temperature between 15 and 30 degrees C (59 and 86 degrees F).Get rid of any unused medication after the expiration date. To get rid of medications that are no longer needed or expired: Take the medication to a medication take-back program. Check with your pharmacy or law enforcement to find a location. If you cannot return the medication, check the label or package insert to see if the medication should be thrown out in the  garbage or flushed down the toilet. If you are not  sure, ask your care team. If it is safe to put in the trash, empty the medication out of the container. Mix the medication with cat litter, dirt, coffee grounds, or other unwanted substance. Seal the mixture in a bag or container. Put it in the trash. NOTE: This sheet is a summary. It may not cover all possible information. If you have questions about this medicine, talk to your doctor, pharmacist, orhealth care provider.  2022 Elsevier/Gold Standard (2020-07-17 70:35:00)

## 2021-01-07 NOTE — Progress Notes (Signed)
Name: Jacob Holder  DOB: Jun 07, 1991 MRN: [id number] PCP: Benito Mccreedy, MD     Brief Narrative:  Jacob Holder is a 30 y.o. male with HIV Dx diagnosed recently 07/2019 during hospital screening with stroke.  CD4 nadir 478 VL 76,200 HIV Risk: MSM History of OIs: none Intake Labs 07/2019: Hep B sAg (-), sAb (-), cAb (-); Hep A (-), Hep C (-) Quantiferon (-) HLA B*5701 (-) G6PD: ()   Previous Regimens: Biktarvy 2021  Genotypes: 07/2019 - wildtype  Subjective:   Chief Complaint  Patient presents with   Follow-up    B20       HPI: Jacob Holder is here for follow up HIV disease. He reports that he has had a very tough time with his medication and mood. He is currently between jobs and having trouble sleeping. Feels like depressed mood is getting worse and interfering.  He is interested in counseling and starting medications for this problem.     Depression screen PHQ 2/9 10/08/2020  Decreased Interest 2  Down, Depressed, Hopeless 2  PHQ - 2 Score 4  Altered sleeping 3  Tired, decreased energy 0  Change in appetite 0  Feeling bad or failure about yourself  1  Trouble concentrating 1  Moving slowly or fidgety/restless 1  Suicidal thoughts 0  PHQ-9 Score 10  Difficult doing work/chores Not difficult at all     Review of Systems  Constitutional:  Negative for chills and fever.  HENT:  Negative for tinnitus.   Eyes:  Negative for blurred vision and photophobia.  Respiratory:  Negative for cough and sputum production.   Cardiovascular:  Negative for chest pain.  Gastrointestinal:  Negative for diarrhea, nausea and vomiting.  Genitourinary:  Negative for dysuria.  Skin:  Negative for rash.  Neurological:  Negative for headaches.  Psychiatric/Behavioral:  Positive for depression. Negative for hallucinations and suicidal ideas. The patient is nervous/anxious and has insomnia.      Past Medical History:  Diagnosis Date   Aneurysm ([place])    Anxiety     Cocaine abuse ([place])    Depression    GERD (gastroesophageal reflux disease)    Headache    Stroke [facility])    Wears contact lenses     Outpatient Medications Prior to Visit  Medication Sig Dispense Refill   aspirin 81 MG chewable tablet Chew 81 mg by mouth daily.     atorvastatin (LIPITOR) 40 MG tablet Take 1 tablet (40 mg total) by mouth daily at 6 PM. 30 tablet 0   clopidogrel (PLAVIX) 75 MG tablet Take 75 mg by mouth daily.     verapamil (CALAN) 80 MG tablet Take 1 tablet (80 mg total) by mouth 3 (three) times daily. 90 tablet 3   bictegravir-emtricitabine-tenofovir AF (BIKTARVY) 50-200-25 MG TABS tablet TAKE 1 TABLET BY MOUTH DAILY. 30 tablet 5   sertraline (ZOLOFT) 25 MG tablet Take 1 tablet (25 mg total) by mouth at bedtime. 30 tablet 4   No facility-administered medications prior to visit.     No Known Allergies  Social History   Tobacco Use   Smoking status: Every Day    Packs/day: 0.50    Types: Cigarettes   Smokeless tobacco: Never  Vaping Use   Vaping Use: Never used  Substance Use Topics   Alcohol use: Yes    Comment: from time to time   Drug use: No    Social History   Substance and Sexual Activity  Sexual Activity Not Currently  Birth control/protection: Condom   Comment: declined condoms     Objective:   Vitals:   01/07/21 1618  BP: 111/73  Pulse: 85  Temp: 98.1 F (36.7 C)  TempSrc: Oral  SpO2: 100%  Weight: 167 lb 12.8 oz (76.1 kg)  Height: 5\' 11"  (1.803 m)   Body mass index is 23.4 kg/m.  Physical Exam Vitals reviewed.  Constitutional:      Appearance: He is well-developed.  HENT:     Mouth/Throat:     Dentition: Normal dentition. No dental abscesses.  Cardiovascular:     Rate and Rhythm: Normal rate and regular rhythm.     Heart sounds: Normal heart sounds.  Pulmonary:     Effort: Pulmonary effort is normal.     Breath sounds: Normal breath sounds.  Abdominal:     General: There is no distension.     Palpations: Abdomen is  soft.     Tenderness: There is no abdominal tenderness.  Lymphadenopathy:     Cervical: No cervical adenopathy.  Skin:    General: Skin is warm and dry.     Findings: No rash.  Neurological:     Mental Status: He is alert and oriented to person, place, and time.  Psychiatric:        Judgment: Judgment normal.     Comments: Tearful and withdrawn    Lab Results Lab Results  Component Value Date   WBC 6.6 10/08/2020   HGB 13.8 10/08/2020   HCT 43.0 10/08/2020   MCV 86.5 10/08/2020   PLT 183 10/08/2020    Lab Results  Component Value Date   CREATININE 0.81 10/08/2020   BUN 10 10/08/2020   NA 139 10/08/2020   K 3.9 10/08/2020   CL 108 10/08/2020   CO2 24 10/08/2020    Lab Results  Component Value Date   ALT 50 (H) 10/08/2020   AST 29 10/08/2020   ALKPHOS 57 07/03/2019   BILITOT 0.4 10/08/2020    Lab Results  Component Value Date   CHOL 90 07/31/2019   HDL 29 (L) 07/31/2019   LDLCALC 47 07/31/2019   TRIG 61 07/31/2019   CHOLHDL 3.1 07/31/2019   HIV 1 RNA Quant (Copies/mL)  Date Value  01/07/2021 30,000 (H)  10/08/2020 67 (H)  05/25/2020 <20   CD4 T Cell Abs (/uL)  Date Value  10/08/2020 789  05/25/2020 687  11/06/2019 574     Assessment & Plan:   Problem List Items Addressed This Visit       Unprioritized   HIV disease (Jacob Holder) (Chronic)    Jacob Holder confides he has been having trouble taking his Biktarvy daily as prescribed. Depression seems to be contributing a lot. We will work on treating depression and hopes to transition to [place] injections, which he is most interested in. Switch process discussed. I informed him that we do need to ensure his current viral load is not too high before we plan the switch. Will check viral load today.  Scheduled tentatively pending this next week for loading dose.        Relevant Orders   HIV-1 RNA quant-no reflex-bld (Completed)   Depression - Primary    Positive screen on PHQ questionnaire and persistent,  interfering effect of symptoms on ADLs, worsening. I set him up with our counselor Marcie Bal and we discussed starting medications today to help with symptoms and sleep management. Melatonin 5mg  QHS titrate to 10 mg. Sertraline 25mg  PO once daily to start QHS as well. Counseled  on expected effect and side effects including discontinuation of use if he develops worsening depression / suicidal features. Will follow up in 1 month and titrate up dose if he notices some benefit from this.        Relevant Medications   sertraline (ZOLOFT) 25 MG tablet   Janene Madeira, MSN, NP-C [facility] [place] Pager: [phone] Office: [phone]

## 2021-01-08 ENCOUNTER — Telehealth: Payer: Self-pay | Admitting: Pharmacist

## 2021-01-08 NOTE — Telephone Encounter (Signed)
Medication Samples have been provided to the patient.  Drug name: Biktarvy        Strength: 50/200/25 mg       Qty: 1 botle (7 tablets)   LOT: [id number]   Exp.Date: 6/24  Dosing instructions: Take one tablet by mouth once daily  The patient has been instructed regarding the correct time, dose, and frequency of taking this medication, including desired effects and most common side effects.   Margarite Gouge, PharmD, CPP Clinical Pharmacist Practitioner Infectious Diseases Clinical Pharmacist [facility]  06/15/2020, 10:07 AM

## 2021-01-09 LAB — HIV-1 RNA QUANT-NO REFLEX-BLD
HIV 1 RNA Quant: 30000 Copies/mL — ABNORMAL HIGH
HIV-1 RNA Quant, Log: 4.48 Log cps/mL — ABNORMAL HIGH

## 2021-01-09 NOTE — Progress Notes (Signed)
Plan was going to be to start him on cabenuva injections however his viral load is too high to do this transition as of now. Will need to hold off and can re-evaluate when he comes in August at the visit with Cassie.   Cassie do you feel comfortable following up on and up-titrating his zoloft at that visit too or would you rather him get on my schedule for that follow up?  He will also be working with Marylu Lund as well so she can help bring things to our attention if something is worse or not working well.

## 2021-01-11 ENCOUNTER — Encounter: Payer: Self-pay | Admitting: Pharmacist

## 2021-01-11 ENCOUNTER — Other Ambulatory Visit (HOSPITAL_COMMUNITY): Payer: Self-pay

## 2021-01-11 NOTE — Progress Notes (Signed)
Thank you :)

## 2021-01-11 NOTE — Progress Notes (Signed)
Yes, I feel comfortable doing that. He has a scheduled f/u with Tammy Sours on the 14th.. does he know that Renaldo Harrison is no longer the plan?

## 2021-01-14 ENCOUNTER — Encounter: Payer: Self-pay | Admitting: Family

## 2021-01-14 ENCOUNTER — Ambulatory Visit: Payer: Self-pay

## 2021-01-15 ENCOUNTER — Other Ambulatory Visit (HOSPITAL_COMMUNITY): Payer: Self-pay

## 2021-01-18 ENCOUNTER — Other Ambulatory Visit: Payer: Self-pay | Admitting: Pharmacist

## 2021-01-18 ENCOUNTER — Other Ambulatory Visit: Payer: Self-pay

## 2021-01-18 ENCOUNTER — Ambulatory Visit (HOSPITAL_COMMUNITY)
Admission: EM | Admit: 2021-01-18 | Discharge: 2021-01-18 | Disposition: A | Discharge status: Home / Self Care | Payer: Self-pay | Attending: Family Medicine | Admitting: Family Medicine

## 2021-01-18 ENCOUNTER — Encounter (HOSPITAL_COMMUNITY): Payer: Self-pay

## 2021-01-18 ENCOUNTER — Other Ambulatory Visit (HOSPITAL_COMMUNITY): Payer: Self-pay

## 2021-01-18 DIAGNOSIS — B20 Human immunodeficiency virus [HIV] disease: Secondary | ICD-10-CM

## 2021-01-18 DIAGNOSIS — M62830 Muscle spasm of back: Secondary | ICD-10-CM

## 2021-01-18 DIAGNOSIS — M545 Low back pain, unspecified: Secondary | ICD-10-CM

## 2021-01-18 MED ORDER — PREDNISONE 20 MG PO TABS: 40.0000 mg | ORAL_TABLET | Freq: Every day | ORAL | 0 refills | Status: DC

## 2021-01-18 MED ORDER — CYCLOBENZAPRINE HCL 10 MG PO TABS: ORAL_TABLET | ORAL | 0 refills | Status: DC

## 2021-01-18 MED ORDER — BIKTARVY 50-200-25 MG PO TABS: 1.0000 | ORAL_TABLET | Freq: Every day | ORAL | 2 refills | Status: DC

## 2021-01-18 MED ORDER — BIKTARVY 50-200-25 MG PO TABS
1.0000 | ORAL_TABLET | Freq: Every day | ORAL | 2 refills | Status: DC
  Filled 2021-01-18: qty 30, 30d supply, fill #0

## 2021-01-18 NOTE — Discharge Instructions (Addendum)
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
## 2021-01-21 NOTE — Assessment & Plan Note (Signed)
Jacob Holder confides he has been having trouble taking his Biktarvy daily as prescribed. Depression seems to be contributing a lot. We will work on treating depression and hopes to transition to [place] injections, which he is most interested in. Switch process discussed. I informed him that we do need to ensure his current viral load is not too high before we plan the switch. Will check viral load today.  Scheduled tentatively pending this next week for loading dose.

## 2021-01-21 NOTE — Assessment & Plan Note (Signed)
Positive screen on PHQ questionnaire and persistent, interfering effect of symptoms on ADLs, worsening. I set him up with our counselor Marylu Lund and we discussed starting medications today to help with symptoms and sleep management. Melatonin 5mg  QHS titrate to 10 mg. Sertraline 25mg  PO once daily to start QHS as well. Counseled on expected effect and side effects including discontinuation of use if he develops worsening depression / suicidal features. Will follow up in 1 month and titrate up dose if he notices some benefit from this.

## 2021-02-14 ENCOUNTER — Other Ambulatory Visit: Payer: Self-pay | Admitting: Infectious Diseases

## 2021-02-14 DIAGNOSIS — F32A Depression, unspecified: Secondary | ICD-10-CM

## 2021-02-15 NOTE — Telephone Encounter (Signed)
Appt 8/16

## 2021-02-16 ENCOUNTER — Ambulatory Visit: Payer: Self-pay | Admitting: Pharmacist

## 2021-02-17 ENCOUNTER — Ambulatory Visit: Payer: Self-pay | Admitting: Pharmacist

## 2021-02-17 ENCOUNTER — Other Ambulatory Visit: Payer: Self-pay

## 2021-02-17 DIAGNOSIS — B20 Human immunodeficiency virus [HIV] disease: Secondary | ICD-10-CM

## 2021-02-17 DIAGNOSIS — F32A Depression, unspecified: Secondary | ICD-10-CM

## 2021-02-17 MED ORDER — SERTRALINE HCL 25 MG PO TABS: 25.0000 mg | ORAL_TABLET | Freq: Every day | ORAL | 1 refills | Status: DC
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
## 2021-02-17 NOTE — Telephone Encounter (Signed)
Jacob Holder,  Was patient going to continue current dose of zoloft or increase dose?

## 2021-02-17 NOTE — Telephone Encounter (Signed)
Continue current dose for now. Will send two additional refills to last until next appointment with [facility]. Thanks!

## 2021-02-19 LAB — HIV-1 RNA QUANT-NO REFLEX-BLD
HIV 1 RNA Quant: 42 Copies/mL — ABNORMAL HIGH
HIV-1 RNA Quant, Log: 1.62 Log cps/mL — ABNORMAL HIGH

## 2021-03-17 ENCOUNTER — Telehealth: Payer: Self-pay | Admitting: Infectious Diseases

## 2021-03-17 NOTE — Telephone Encounter (Signed)
Called patient and LVM as he requested a call. I will send him a myChart communication as well.

## 2021-03-30 ENCOUNTER — Ambulatory Visit: Payer: Self-pay

## 2021-04-06 ENCOUNTER — Ambulatory Visit: Payer: Self-pay

## 2021-04-06 ENCOUNTER — Other Ambulatory Visit: Payer: Self-pay

## 2021-04-13 ENCOUNTER — Other Ambulatory Visit: Payer: Self-pay

## 2021-04-13 ENCOUNTER — Ambulatory Visit: Payer: Self-pay

## 2021-04-20 ENCOUNTER — Ambulatory Visit: Payer: Self-pay

## 2021-04-27 ENCOUNTER — Ambulatory Visit: Payer: Self-pay

## 2021-04-29 ENCOUNTER — Ambulatory Visit: Payer: Self-pay

## 2021-04-29 ENCOUNTER — Other Ambulatory Visit: Payer: Self-pay

## 2021-04-29 ENCOUNTER — Ambulatory Visit: Payer: Self-pay | Admitting: Infectious Diseases

## 2021-04-29 ENCOUNTER — Ambulatory Visit (INDEPENDENT_AMBULATORY_CARE_PROVIDER_SITE_OTHER): Payer: Self-pay | Admitting: Pharmacist

## 2021-04-29 ENCOUNTER — Encounter: Payer: Self-pay | Admitting: Pharmacist

## 2021-04-29 DIAGNOSIS — F32A Depression, unspecified: Secondary | ICD-10-CM

## 2021-04-29 DIAGNOSIS — B20 Human immunodeficiency virus [HIV] disease: Secondary | ICD-10-CM

## 2021-04-29 MED ORDER — BICTEGRAVIR-EMTRICITAB-TENOFOV 50-200-25 MG PO TABS: 1.0000 | ORAL_TABLET | Freq: Every day | ORAL | 2 refills | Status: DC

## 2021-04-29 MED ORDER — ESCITALOPRAM OXALATE 10 MG PO TABS: 10.0000 mg | ORAL_TABLET | Freq: Every day | ORAL | 2 refills | Status: DC

## 2021-04-29 MED ORDER — TRAZODONE HCL 50 MG PO TABS
ORAL_TABLET | ORAL | 2 refills | Status: DC
Start: 2021-04-29 — End: 2021-06-01

## 2021-04-29 MED ORDER — TRAZODONE HCL 50 MG PO TABS
ORAL_TABLET | ORAL | 2 refills | Status: DC
Start: 2021-04-29 — End: 2021-04-29

## 2021-04-29 MED ORDER — BICTEGRAVIR-EMTRICITAB-TENOFOV 50-200-25 MG PO TABS
1.0000 | ORAL_TABLET | Freq: Every day | ORAL | 2 refills | Status: DC
Start: 2021-04-29 — End: 2021-04-29

## 2021-04-29 NOTE — Patient Instructions (Signed)
  [facility] [id number] Address:  [address]  [place], [place] [address] Open 24/7, no appointment needed.   Or [facility] comes to you if you need them for an evaluation  [phone] 24/7 a week   Judeth Cornfield will put in a referral to psychiatry to see what we can do to help
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
## 2021-05-02 LAB — HIV-1 RNA QUANT-NO REFLEX-BLD
HIV 1 RNA Quant: 23 Copies/mL — ABNORMAL HIGH
HIV-1 RNA Quant, Log: 1.37 Log cps/mL — ABNORMAL HIGH

## 2021-05-05 ENCOUNTER — Encounter: Payer: Self-pay | Admitting: Infectious Diseases

## 2021-05-06 NOTE — Telephone Encounter (Signed)
That seems reasonable to me. I appreciate your willingness to help him. I will let him know that at least he can use the letter you previously wrote him. Have you provided that to him yet or are you still working on the original letter? Thanks!
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
## 2021-05-07 NOTE — Telephone Encounter (Signed)
FYI - see his message.

## 2021-05-10 ENCOUNTER — Encounter: Payer: Self-pay | Admitting: Family

## 2021-05-11 ENCOUNTER — Other Ambulatory Visit: Payer: Self-pay | Admitting: Pharmacist

## 2021-05-11 DIAGNOSIS — B20 Human immunodeficiency virus [HIV] disease: Secondary | ICD-10-CM

## 2021-05-11 MED ORDER — BICTEGRAVIR-EMTRICITAB-TENOFOV 50-200-25 MG PO TABS: 1.0000 | ORAL_TABLET | Freq: Every day | ORAL | 0 refills | Status: AC

## 2021-05-11 NOTE — Progress Notes (Signed)
Medication Samples have been provided to the patient.  Drug name: Biktarvy        Strength: 50/200/25 mg       Qty: 21 tablets (3 bottles) LOT: [id number]   Exp.Date: 10/24  Dosing instructions: Take one tablet by mouth once daily  The patient has been instructed regarding the correct time, dose, and frequency of taking this medication, including desired effects and most common side effects.   Margarite Gouge, PharmD, CPP Clinical Pharmacist Practitioner Infectious Diseases Clinical Pharmacist [facility] for Infectious Disease

## 2021-05-18 ENCOUNTER — Encounter: Payer: Self-pay | Admitting: Pharmacist

## 2021-06-01 ENCOUNTER — Encounter: Payer: Self-pay | Admitting: Family

## 2021-06-01 ENCOUNTER — Other Ambulatory Visit: Payer: Self-pay

## 2021-06-01 ENCOUNTER — Ambulatory Visit (INDEPENDENT_AMBULATORY_CARE_PROVIDER_SITE_OTHER): Payer: Self-pay | Admitting: Family

## 2021-06-01 DIAGNOSIS — F32A Depression, unspecified: Secondary | ICD-10-CM

## 2021-06-01 DIAGNOSIS — B20 Human immunodeficiency virus [HIV] disease: Secondary | ICD-10-CM

## 2021-06-01 MED ORDER — TRAZODONE HCL 50 MG PO TABS: 50.0000 mg | ORAL_TABLET | Freq: Every day | ORAL | 2 refills | Status: DC

## 2021-06-01 NOTE — Patient Instructions (Signed)
Nice to meet you.  Continue to take your medications as prescribed.  Increase the Trazodone to 50-100 mg (1-2 tablets) at bedtime to help with sleep.   Schedule an appointment with counseling.   Plan for follow up in 1 month or sooner if needed with [facility].   Have a good day!

## 2021-06-01 NOTE — Assessment & Plan Note (Addendum)
Jacob Holder appears to be stable and has no suicidal ideation or signs of psychosis. Likely to benefit from additional counseling to help with coping mechanisms. Discussed options for medications and will continue current dose of Lexapro and increase Trazodone to 50-100 mg nightly. Can also consider mirtazapine if the current regimen is unsuccessful. Advised to seek further care if thoughts of suicide occur or develops a plan. Follow up in 1 month or sooner if needed.
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
## 2021-06-01 NOTE — Assessment & Plan Note (Signed)
Mr. Deere continues to take his Biktarvy daily as prescribed. We discussed possible transition to [place] and I expressed my concern about potential for worsening depression with [place] in addition to him potentially getting insurance. Recommend he stay on Biktarvy for now until he finds out about insurance and mood is improved/stable.

## 2021-06-15 ENCOUNTER — Ambulatory Visit: Payer: Self-pay

## 2021-07-13 ENCOUNTER — Other Ambulatory Visit: Payer: Self-pay

## 2021-07-13 ENCOUNTER — Encounter: Payer: Self-pay | Admitting: Infectious Diseases

## 2021-07-13 ENCOUNTER — Ambulatory Visit (INDEPENDENT_AMBULATORY_CARE_PROVIDER_SITE_OTHER): Payer: Self-pay | Admitting: Infectious Diseases

## 2021-07-13 VITALS — BP 115/71 | HR 92 | Temp 99.0°F | Resp 16 | Ht 71.0 in | Wt 171.8 lb

## 2021-07-13 DIAGNOSIS — B20 Human immunodeficiency virus [HIV] disease: Secondary | ICD-10-CM

## 2021-07-13 DIAGNOSIS — F32A Depression, unspecified: Secondary | ICD-10-CM

## 2021-07-13 DIAGNOSIS — G4700 Insomnia, unspecified: Secondary | ICD-10-CM

## 2021-07-13 DIAGNOSIS — Z23 Encounter for immunization: Secondary | ICD-10-CM

## 2021-07-13 MED ORDER — BICTEGRAVIR-EMTRICITAB-TENOFOV 50-200-25 MG PO TABS: 1.0000 | ORAL_TABLET | Freq: Every day | ORAL | 11 refills | Status: AC

## 2021-07-13 MED ORDER — ESCITALOPRAM OXALATE 10 MG PO TABS: 10.0000 mg | ORAL_TABLET | Freq: Every day | ORAL | 2 refills | Status: AC

## 2021-07-13 MED ORDER — TRAZODONE HCL 50 MG PO TABS: 50.0000 mg | ORAL_TABLET | Freq: Every day | ORAL | 3 refills | Status: AC

## 2021-07-13 NOTE — Assessment & Plan Note (Addendum)
PHQ9 improved on lexapro 10mg . Sleep is not 100% where he would like it but feels overall rested and can function. Will add melatonin 5-10 mg capsule QHS with Trazodone to see if that helps him stay asleep. Refills of trazodone 50-100 mg given.  I would like for him to continue lexapro for at least 6 months at therapeutic dose to help reduce likelihood of relapse. Especially with the good stress that comes with a new job.

## 2021-07-13 NOTE — Progress Notes (Signed)
Name: Jacob Holder  DOB: [birth year] MRN: [id number] PCP: Benito Mccreedy, MD     Brief Narrative:  Jacob Holder is a 31 y.o. male with HIV Dx diagnosed recently 07/2019 during hospital screening with stroke.  CD4 nadir 478 VL 76,200 HIV Risk: MSM History of OIs: none Intake Labs 07/2019: Hep B sAg (-), sAb (-), cAb (-); Hep A (-), Hep C (-) Quantiferon (-) HLA B*5701 (-) G6PD: ()   Previous Regimens: Biktarvy 2021  Genotypes: 07/2019 - wildtype  Subjective:   Chief Complaint  Patient presents with   Follow-up    B20       HPI: Here for routine follow up care for HIV / Depression.  Saw Terri Piedra in late Nov 2022 --> increased trazodone to 50-100 mg at bedtime for sleep. Was doing well on Lexapro 10 mg at that time.  He today feels that his mood has improved significantly. May be due to the Lexapro / better sleep but also situationally things are better and more hopeful for him. He is starting grad school soon, training for a new job and hopefully things settle out with court soon. He overall reports happiness and hopefulness for future. Sleep is better but not perfect. The 2 trazodone helps him fall asleep reliably but he still has trouble staying asleep more than a few hours at a time. Wakes around 8 am and feels well rested, no daytime fatigue. Appetite is good.    Depression screen PHQ 2/9 07/13/2021  Decreased Interest 1  Down, Depressed, Hopeless 1  PHQ - 2 Score 2  Altered sleeping 3  Tired, decreased energy 0  Change in appetite 0  Feeling bad or failure about yourself  1  Trouble concentrating 0  Moving slowly or fidgety/restless 0  Suicidal thoughts 0  PHQ-9 Score 6  Difficult doing work/chores Somewhat difficult     Review of Systems  Constitutional:  Negative for chills and fever.  HENT:  Negative for tinnitus.   Eyes:  Negative for blurred vision and photophobia.  Respiratory:  Negative for cough and sputum production.    Cardiovascular:  Negative for chest pain.  Gastrointestinal:  Negative for diarrhea, nausea and vomiting.  Genitourinary:  Negative for dysuria.  Skin:  Negative for rash.  Neurological:  Negative for headaches.  Psychiatric/Behavioral:  Positive for depression (improved). Negative for hallucinations and suicidal ideas. The patient has insomnia. The patient is not nervous/anxious.      Past Medical History:  Diagnosis Date   Aneurysm ([place])    Anxiety    Cocaine abuse ([place])    Depression    GERD (gastroesophageal reflux disease)    Headache    Stroke [facility])    Wears contact lenses     Outpatient Medications Prior to Visit  Medication Sig Dispense Refill   aspirin 81 MG chewable tablet Chew 81 mg by mouth daily.     atorvastatin (LIPITOR) 40 MG tablet Take 1 tablet (40 mg total) by mouth daily at 6 PM. 30 tablet 0   clopidogrel (PLAVIX) 75 MG tablet Take 75 mg by mouth daily.     predniSONE (DELTASONE) 20 MG tablet Take 2 tablets (40 mg total) by mouth daily. 10 tablet 0   bictegravir-emtricitabine-tenofovir AF (BIKTARVY) 50-200-25 MG TABS tablet Take 1 tablet by mouth daily. 30 tablet 2   traZODone (DESYREL) 50 MG tablet Take 1-2 tablets (50-100 mg total) by mouth at bedtime. 60 tablet 2   verapamil (CALAN) 80 MG tablet Take 1 tablet (  80 mg total) by mouth 3 (three) times daily. (Patient not taking: Reported on 06/01/2021) 90 tablet 3   cyclobenzaprine (FLEXERIL) 10 MG tablet Take 1 tablet by mouth 3 times daily as needed for muscle spasm. Warning: May cause drowsiness. (Patient not taking: Reported on 07/13/2021) 21 tablet 0   escitalopram (LEXAPRO) 10 MG tablet Take 1 tablet (10 mg total) by mouth daily. (Patient not taking: Reported on 07/13/2021) 30 tablet 2   No facility-administered medications prior to visit.     No Known Allergies  Social History   Tobacco Use   Smoking status: Every Day    Packs/day: 0.30    Types: Cigarettes   Smokeless tobacco: Never  Vaping Use    Vaping Use: Never used  Substance Use Topics   Alcohol use: Yes    Comment: from time to time   Drug use: No    Social History   Substance and Sexual Activity  Sexual Activity Not Currently   Birth control/protection: Condom   Comment: declined condoms     Objective:   Vitals:   07/13/21 1556  BP: 115/71  Pulse: 92  Resp: 16  Temp: 99 F (37.2 C)  TempSrc: Temporal  SpO2: 100%  Weight: 171 lb 12.8 oz (77.9 kg)  Height: _0  (1.803 m)   Body mass index is 23.96 kg/m.   Physical Exam Vitals reviewed.  Constitutional:      Appearance: He is well-developed.  HENT:     Mouth/Throat:     Dentition: Normal dentition. No dental abscesses.  Cardiovascular:     Rate and Rhythm: Normal rate and regular rhythm.     Heart sounds: Normal heart sounds.  Pulmonary:     Effort: Pulmonary effort is normal.     Breath sounds: Normal breath sounds.  Abdominal:     General: There is no distension.     Palpations: Abdomen is soft.     Tenderness: There is no abdominal tenderness.  Lymphadenopathy:     Cervical: No cervical adenopathy.  Skin:    General: Skin is warm and dry.     Findings: No rash.  Neurological:     Mental Status: He is alert and oriented to person, place, and time.  Psychiatric:        Mood and Affect: Mood normal.        Behavior: Behavior normal.        Thought Content: Thought content normal.        Judgment: Judgment normal.    Lab Results Lab Results  Component Value Date   WBC 6.6 10/08/2020   HGB 13.8 10/08/2020   HCT 43.0 10/08/2020   MCV 86.5 10/08/2020   PLT 183 10/08/2020    Lab Results  Component Value Date   CREATININE 0.81 10/08/2020   BUN 10 10/08/2020   NA 139 10/08/2020   K 3.9 10/08/2020   CL 108 10/08/2020   CO2 24 10/08/2020    Lab Results  Component Value Date   ALT 50 (H) 10/08/2020   AST 29 10/08/2020   ALKPHOS 57 07/03/2019   BILITOT 0.4 10/08/2020    Lab Results  Component Value Date   CHOL 90  07/31/2019   HDL 29 (L) 07/31/2019   LDLCALC 47 07/31/2019   TRIG 61 07/31/2019   CHOLHDL 3.1 07/31/2019   HIV 1 RNA Quant (Copies/mL)  Date Value  07/13/2021 86 (H)  04/29/2021 23 (H)  02/17/2021 42 (H)   CD4 T Cell Abs (/uL)  Date Value  10/08/2020 789  05/25/2020 687  11/06/2019 574     Assessment & Plan:   Problem List Items Addressed This Visit       Unprioritized   HIV disease ([place]) - Primary (Chronic)    Well controlled on [place] since July 2022. Last VL 4mago was < 40. Will repeat today for monitoring.  We discussed Cabenuva again; new insurance will start with new job but not active yet. We did talk about possible concern over worsening depression for cabotegravir component - we can re-evaluate this at another time to see if he is interested if his depression stays under good control. He will continue biktarvy for now - refills sent in.  Vaccines updated with final doses of hep A and hep b series.  Would benefit from Meningococcal as well.  RTC in 471m    Relevant Medications   bictegravir-emtricitabine-tenofovir AF (BIKTARVY) 50-200-25 MG TABS tablet   Other Relevant Orders   HIV-1 RNA quant-no reflex-bld (Completed)   Depression    PHQ9 improved on lexapro 1029mSleep is not 100% where he would like it but feels overall rested and can function. Will add melatonin 5-10 mg capsule QHS with Trazodone to see if that helps him stay asleep. Refills of trazodone 50-100 mg given.  I would like for him to continue lexapro for at least 6 months at therapeutic dose to help reduce likelihood of relapse. Especially with the good stress that comes with a new job.       Relevant Medications   escitalopram (LEXAPRO) 10 MG tablet   traZODone (DESYREL) 50 MG tablet   Other Visit Diagnoses     Insomnia, unspecified type       Relevant Medications   traZODone (DESYREL) 50 MG tablet   Need for hepatitis B vaccination       Relevant Orders   Heplisav-B (HepB-CPG) Vaccine  (Completed)     SteJanene MadeiraSN, NP-C [facility] [facility] InfDumasger: [phone]: [phone]

## 2021-07-13 NOTE — Assessment & Plan Note (Signed)
Well controlled on [place] since July 2022. Last VL 75m ago was < 40. Will repeat today for monitoring.  We discussed Cabenuva again; new insurance will start with new job but not active yet. We did talk about possible concern over worsening depression for cabotegravir component - we can re-evaluate this at another time to see if he is interested if his depression stays under good control. He will continue biktarvy for now - refills sent in.  Vaccines updated with final doses of hep A and hep b series.  Would benefit from Meningococcal as well.  RTC in 57m

## 2021-07-13 NOTE — Patient Instructions (Addendum)
[employer] is a good [employer], [employer] (consulting) that you can link your card to for free books and audiobooks   Melatonin capsules to see if that taken with the trazodone at night can help you sleep and stay asleep. 5 or 6 mg capsules are good to try and safe to take with all your other medications.   Continue the lexapro every night

## 2021-07-15 LAB — HIV-1 RNA QUANT-NO REFLEX-BLD
HIV 1 RNA Quant: 86 Copies/mL — ABNORMAL HIGH
HIV-1 RNA Quant, Log: 1.93 Log cps/mL — ABNORMAL HIGH

## 2021-08-29 ENCOUNTER — Other Ambulatory Visit: Payer: Self-pay | Admitting: Pharmacist

## 2021-08-29 DIAGNOSIS — F32A Depression, unspecified: Secondary | ICD-10-CM

## 2021-08-29 DIAGNOSIS — G4700 Insomnia, unspecified: Secondary | ICD-10-CM

## 2021-12-28 ENCOUNTER — Other Ambulatory Visit: Payer: Self-pay

## 2021-12-28 ENCOUNTER — Encounter (HOSPITAL_BASED_OUTPATIENT_CLINIC_OR_DEPARTMENT_OTHER): Payer: Self-pay | Admitting: *Deleted

## 2021-12-28 DIAGNOSIS — Z20822 Contact with and (suspected) exposure to covid-19: Secondary | ICD-10-CM | POA: Insufficient documentation

## 2021-12-28 DIAGNOSIS — Z7982 Long term (current) use of aspirin: Secondary | ICD-10-CM | POA: Diagnosis not present

## 2021-12-28 DIAGNOSIS — J029 Acute pharyngitis, unspecified: Secondary | ICD-10-CM | POA: Insufficient documentation

## 2021-12-28 DIAGNOSIS — Z7902 Long term (current) use of antithrombotics/antiplatelets: Secondary | ICD-10-CM | POA: Insufficient documentation

## 2021-12-28 LAB — GROUP A STREP BY PCR: Group A Strep by PCR: NOT DETECTED

## 2021-12-28 LAB — SARS CORONAVIRUS 2 BY RT PCR: SARS Coronavirus 2 by RT PCR: NEGATIVE

## 2021-12-28 MED ORDER — IBUPROFEN 400 MG PO TABS
400.0000 mg | ORAL_TABLET | Freq: Once | ORAL | Status: AC | PRN
  Administered 2021-12-28: 400 mg via ORAL
  Filled 2021-12-28: qty 1

## 2021-12-29 ENCOUNTER — Emergency Department (HOSPITAL_COMMUNITY)
Admission: EM | Admit: 2021-12-29 | Discharge: 2021-12-29 | Discharge status: Against Medical Advice | Payer: Self-pay | Attending: Emergency Medicine | Admitting: Emergency Medicine

## 2021-12-29 ENCOUNTER — Encounter (HOSPITAL_BASED_OUTPATIENT_CLINIC_OR_DEPARTMENT_OTHER): Payer: Self-pay | Admitting: Emergency Medicine

## 2021-12-29 ENCOUNTER — Emergency Department (HOSPITAL_BASED_OUTPATIENT_CLINIC_OR_DEPARTMENT_OTHER)
Admission: EM | Admit: 2021-12-29 | Discharge: 2021-12-29 | Disposition: A | Discharge status: Home / Self Care | Payer: Self-pay | Attending: Emergency Medicine | Admitting: Emergency Medicine

## 2021-12-29 ENCOUNTER — Encounter (HOSPITAL_COMMUNITY): Payer: Self-pay | Admitting: Pharmacy Technician

## 2021-12-29 DIAGNOSIS — Z5321 Procedure and treatment not carried out due to patient leaving prior to being seen by health care provider: Secondary | ICD-10-CM | POA: Insufficient documentation

## 2021-12-29 DIAGNOSIS — J029 Acute pharyngitis, unspecified: Secondary | ICD-10-CM | POA: Insufficient documentation

## 2021-12-29 LAB — BASIC METABOLIC PANEL
Anion gap: 9 (ref 5–15)
BUN: 7 mg/dL (ref 6–20)
CO2: 21 mmol/L — ABNORMAL LOW (ref 22–32)
Calcium: 8.8 mg/dL — ABNORMAL LOW (ref 8.9–10.3)
Chloride: 105 mmol/L (ref 98–111)
Creatinine, Ser: 0.84 mg/dL (ref 0.61–1.24)
GFR, Estimated: >60 mL/min (ref >60)
Glucose, Bld: 95 mg/dL (ref 70–99)
Potassium: 3.5 mmol/L (ref 3.5–5.1)
Sodium: 135 mmol/L (ref 135–145)

## 2021-12-29 LAB — CBC WITH DIFFERENTIAL/PLATELET
Abs Immature Granulocytes: 0.22 10*3/uL — ABNORMAL HIGH (ref 0.00–0.07)
Basophils Absolute: 0.1 10*3/uL (ref 0.0–0.1)
Basophils Relative: 0 %
Eosinophils Absolute: 0 10*3/uL (ref 0.0–0.5)
Eosinophils Relative: 0 %
HCT: 38.3 % — ABNORMAL LOW (ref 39.0–52.0)
Hemoglobin: 12.6 g/dL — ABNORMAL LOW (ref 13.0–17.0)
Immature Granulocytes: 1 %
Lymphocytes Relative: 14 %
Lymphs Abs: 3.3 10*3/uL (ref 0.7–4.0)
MCH: 28.5 pg (ref 26.0–34.0)
MCHC: 32.9 g/dL (ref 30.0–36.0)
MCV: 86.7 fL (ref 80.0–100.0)
Monocytes Absolute: 1.3 10*3/uL — ABNORMAL HIGH (ref 0.1–1.0)
Monocytes Relative: 6 %
Neutro Abs: 18.9 10*3/uL — ABNORMAL HIGH (ref 1.7–7.7)
Neutrophils Relative %: 79 %
Platelets: 93 10*3/uL — ABNORMAL LOW (ref 150–400)
RBC: 4.42 MIL/uL (ref 4.22–5.81)
RDW: 13.1 % (ref 11.5–15.5)
WBC: 23.9 10*3/uL — ABNORMAL HIGH (ref 4.0–10.5)
nRBC: 0 % (ref 0.0–0.2)

## 2021-12-29 MED ORDER — LIDOCAINE VISCOUS HCL 2 % MT SOLN
15.0000 mL | Freq: Once | OROMUCOSAL | Status: AC
  Administered 2021-12-29: 15 mL via OROMUCOSAL
  Filled 2021-12-29: qty 15

## 2021-12-29 MED ORDER — NYSTATIN 100000 UNIT/ML MT SUSP: 5.0000 mL | Freq: Four times a day (QID) | OROMUCOSAL | 0 refills | Status: DC

## 2021-12-29 MED ORDER — ACETAMINOPHEN 500 MG PO TABS
1000.0000 mg | ORAL_TABLET | Freq: Once | ORAL | Status: AC
  Administered 2021-12-29: 1000 mg via ORAL
  Filled 2021-12-29: qty 2

## 2021-12-29 NOTE — ED Provider Notes (Signed)
[facility] EMERGENCY DEPT Provider Note   CSN: [id number] Arrival date & time: 12/28/21  2159     History  Chief Complaint  Patient presents with   Sore Throat    Jacob Holder is a 31 y.o. male.  The history is provided by the patient.  Sore Throat This is a new problem. The current episode started yesterday. The problem occurs constantly. The problem has not changed since onset.Pertinent negatives include no chest pain, no abdominal pain, no headaches and no shortness of breath. Nothing aggravates the symptoms. Nothing relieves the symptoms. He has tried nothing for the symptoms. The treatment provided no relief.  Sore throat for 1 day.  No change in voice.  No f/c/r. No coating of the tongue.  Only lozenges, no pain medication taken.  No oral sex.  No known sick contacts but was at a funeral.       Home Medications Prior to Admission medications   Medication Sig Start Date End Date Taking? Authorizing Provider  aspirin 81 MG chewable tablet Chew 81 mg by mouth daily.    [provider]  atorvastatin (LIPITOR) 40 MG tablet Take 1 tablet (40 mg total) by mouth daily at 6 PM. 07/04/19   Black, Lesle Chris, NP  bictegravir-emtricitabine-tenofovir AF (BIKTARVY) 50-200-25 MG TABS tablet Take 1 tablet by mouth daily. 07/13/21   Blanchard Kelch, NP  clopidogrel (PLAVIX) 75 MG tablet Take 75 mg by mouth daily.    [provider]  escitalopram (LEXAPRO) 10 MG tablet Take 1 tablet (10 mg total) by mouth daily. 07/13/21   Blanchard Kelch, NP  predniSONE (DELTASONE) 20 MG tablet Take 2 tablets (40 mg total) by mouth daily. 01/18/21   Mardella Layman, MD  traZODone (DESYREL) 50 MG tablet Take 1-2 tablets (50-100 mg total) by mouth at bedtime. 07/13/21   Blanchard Kelch, NP  verapamil (CALAN) 80 MG tablet Take 1 tablet (80 mg total) by mouth 3 (three) times daily. Patient not taking: Reported on 06/01/2021 02/20/19   Drema Dallas, DO      Allergies     Patient has no known allergies.    Review of Systems   Review of Systems  Constitutional:  Negative for fever.  HENT:  Positive for sore throat. Negative for facial swelling, trouble swallowing and voice change.   Eyes:  Negative for redness.  Respiratory:  Negative for shortness of breath.   Cardiovascular:  Negative for chest pain.  Gastrointestinal:  Negative for abdominal pain.  Neurological:  Negative for headaches.  All other systems reviewed and are negative.   Physical Exam Updated Vital Signs BP (!) 128/92 (BP Location: Right Arm)   Pulse 92   Temp 99 F (37.2 C)   Resp 20   SpO2 100%  Physical Exam Vitals and nursing note reviewed.  Constitutional:      General: He is not in acute distress.    Appearance: Normal appearance. He is well-developed. He is not diaphoretic.  HENT:     Head: Normocephalic and atraumatic.     Nose: Nose normal.     Mouth/Throat:     Mouth: Mucous membranes are moist.     Pharynx: Oropharynx is clear. No oropharyngeal exudate.     Comments: No trismus intact phonation.  Uvula is normal and midline Eyes:     Conjunctiva/sclera: Conjunctivae normal.     Pupils: Pupils are equal, round, and reactive to light.  Neck:     Comments: Intact phonation.  No LAN no pain with displacement of the trachea Cardiovascular:     Rate and Rhythm: Normal rate and regular rhythm.     Pulses: Normal pulses.     Heart sounds: Normal heart sounds.  Pulmonary:     Effort: Pulmonary effort is normal.     Breath sounds: Normal breath sounds. No wheezing or rales.  Abdominal:     General: Abdomen is flat. Bowel sounds are normal.     Palpations: Abdomen is soft.     Tenderness: There is no abdominal tenderness. There is no guarding or rebound.  Musculoskeletal:        General: Normal range of motion.     Cervical back: Normal range of motion and neck supple.  Skin:    General: Skin is warm and dry.     Capillary Refill: Capillary refill takes less than  2 seconds.  Neurological:     General: No focal deficit present.     Mental Status: He is alert and oriented to person, place, and time.     Sensory: No sensory deficit.  Psychiatric:        Mood and Affect: Mood normal.        Behavior: Behavior normal.     ED Results / Procedures / Treatments   Labs (all labs ordered are listed, but only abnormal results are displayed) Labs Reviewed  GROUP A STREP BY PCR  SARS CORONAVIRUS 2 BY RT PCR    EKG None  Radiology No results found.  Procedures Procedures    Medications Ordered in ED Medications  lidocaine (XYLOCAINE) 2 % viscous mouth solution 15 mL (has no administration in time range)  ibuprofen (ADVIL) tablet 400 mg (400 mg Oral Given 12/28/21 2224)    ED Course/ Medical Decision Making/ A&P                           Medical Decision Making Patient with one day of sore throat no  fevers   Problems Addressed: Pharyngitis, unspecified etiology: acute illness or injury    Details: Will treat with magic mouthwash as it is not strep or covid.  this will provide pain relief.  Follow up with ID for ongoing care  Amount and/or Complexity of Data Reviewed External Data Reviewed: notes.    Details: previous notes reviewed Labs: ordered.    Details: negative covid and strep  Risk OTC drugs. Prescription drug management. Risk Details: Treated in the ED with ibuprofen and tylenol.  Continue to alternate those medications at home.  Magic mouthwash ordered.  Follow up with your ID physician for ongoing care.      Final Clinical Impression(s) / ED Diagnoses Final diagnoses:  None   Return for intractable cough, coughing up blood, fevers > 100.4 unrelieved by medication, shortness of breath, intractable vomiting, chest pain, shortness of breath, weakness, numbness, changes in speech, facial asymmetry, abdominal pain, passing out, Inability to tolerate liquids or food, cough, altered mental status or any concerns. No signs of  systemic illness or infection. The patient is nontoxic-appearing on exam and vital signs are within normal limits.  I have reviewed the triage vital signs and the nursing notes. Pertinent labs & imaging results that were available during my care of the patient were reviewed by me and considered in my medical decision making (see chart for details). After history, exam, and medical workup I feel the patient has been appropriately medically screened and is safe for  discharge home. Pertinent diagnoses were discussed with the patient. Patient was given return precautions.  Rx / DC Orders ED Discharge Orders     None         Eve Rey, MD 12/29/21 8469

## 2021-12-29 NOTE — ED Notes (Signed)
Pt verbalizes understanding of discharge instructions. Opportunity for questioning and answers were provided. Pt discharged from ED to home.   ? ?

## 2021-12-29 NOTE — ED Triage Notes (Signed)
Pt here with sore throat X2 days. Seen last night for same.

## 2021-12-29 NOTE — ED Provider Triage Note (Signed)
Emergency Medicine Provider Triage Evaluation Note  Jacob Holder , a 31 y.o. male  was evaluated in triage.  Pt complains of sore throat. Report sore throat for the past 2-3 days.  Seen yesterday in the ER and had negative strep/covid.  Throat worsen, seen at [facility] today and was recommended to come to the ER to r/o PTA  Review of Systems  Positive: As above Negative: As above  Physical Exam  BP 132/78   Pulse 98   Temp 99.5 F (37.5 C) (Oral)   Resp 20   SpO2 99%  Gen:   Awake, no distress   Resp:  Normal effort  MSK:   Moves extremities without difficulty  Other:    Medical Decision Making  Medically screening exam initiated at 1:18 PM.  Appropriate orders placed.  Jacob Holder was informed that the remainder of the evaluation will be completed by another provider, this initial triage assessment does not replace that evaluation, and the importance of remaining in the ED until their evaluation is complete.     Fayrene Helper, PA-C 12/29/21 1324

## 2021-12-30 ENCOUNTER — Other Ambulatory Visit (HOSPITAL_BASED_OUTPATIENT_CLINIC_OR_DEPARTMENT_OTHER): Payer: Self-pay

## 2021-12-30 ENCOUNTER — Emergency Department (HOSPITAL_BASED_OUTPATIENT_CLINIC_OR_DEPARTMENT_OTHER)
Admission: EM | Admit: 2021-12-30 | Discharge: 2021-12-30 | Disposition: A | Discharge status: Home / Self Care | Payer: Self-pay | Attending: Emergency Medicine | Admitting: Emergency Medicine

## 2021-12-30 ENCOUNTER — Other Ambulatory Visit: Payer: Self-pay

## 2021-12-30 ENCOUNTER — Encounter (HOSPITAL_BASED_OUTPATIENT_CLINIC_OR_DEPARTMENT_OTHER): Payer: Self-pay | Admitting: Emergency Medicine

## 2021-12-30 ENCOUNTER — Emergency Department (HOSPITAL_BASED_OUTPATIENT_CLINIC_OR_DEPARTMENT_OTHER): Payer: Self-pay

## 2021-12-30 DIAGNOSIS — Z7982 Long term (current) use of aspirin: Secondary | ICD-10-CM | POA: Insufficient documentation

## 2021-12-30 DIAGNOSIS — J039 Acute tonsillitis, unspecified: Secondary | ICD-10-CM | POA: Insufficient documentation

## 2021-12-30 LAB — CBC WITH DIFFERENTIAL/PLATELET
Abs Immature Granulocytes: 0.21 10*3/uL — ABNORMAL HIGH (ref 0.00–0.07)
Basophils Absolute: 0.1 10*3/uL (ref 0.0–0.1)
Basophils Relative: 0 %
Eosinophils Absolute: 0.1 10*3/uL (ref 0.0–0.5)
Eosinophils Relative: 0 %
HCT: 39.2 % (ref 39.0–52.0)
Hemoglobin: 12.8 g/dL — ABNORMAL LOW (ref 13.0–17.0)
Immature Granulocytes: 1 %
Lymphocytes Relative: 9 %
Lymphs Abs: 2.2 10*3/uL (ref 0.7–4.0)
MCH: 27.5 pg (ref 26.0–34.0)
MCHC: 32.7 g/dL (ref 30.0–36.0)
MCV: 84.1 fL (ref 80.0–100.0)
Monocytes Absolute: 1.3 10*3/uL — ABNORMAL HIGH (ref 0.1–1.0)
Monocytes Relative: 6 %
Neutro Abs: 19.4 10*3/uL — ABNORMAL HIGH (ref 1.7–7.7)
Neutrophils Relative %: 84 %
Platelets: 62 10*3/uL — ABNORMAL LOW (ref 150–400)
RBC: 4.66 MIL/uL (ref 4.22–5.81)
RDW: 13.2 % (ref 11.5–15.5)
WBC: 23.2 10*3/uL — ABNORMAL HIGH (ref 4.0–10.5)
nRBC: 0 % (ref 0.0–0.2)

## 2021-12-30 LAB — BASIC METABOLIC PANEL
Anion gap: 11 (ref 5–15)
BUN: 9 mg/dL (ref 6–20)
CO2: 23 mmol/L (ref 22–32)
Calcium: 9.6 mg/dL (ref 8.9–10.3)
Chloride: 101 mmol/L (ref 98–111)
Creatinine, Ser: 0.76 mg/dL (ref 0.61–1.24)
GFR, Estimated: >60 mL/min (ref >60)
Glucose, Bld: 86 mg/dL (ref 70–99)
Potassium: 3.4 mmol/L — ABNORMAL LOW (ref 3.5–5.1)
Sodium: 135 mmol/L (ref 135–145)

## 2021-12-30 LAB — GROUP A STREP BY PCR: Group A Strep by PCR: NOT DETECTED

## 2021-12-30 MED ORDER — IOHEXOL 300 MG/ML  SOLN
100.0000 mL | Freq: Once | INTRAMUSCULAR | Status: AC | PRN
  Administered 2021-12-30: 75 mL via INTRAVENOUS

## 2021-12-30 MED ORDER — SODIUM CHLORIDE 0.9 % IV BOLUS
1000.0000 mL | Freq: Once | INTRAVENOUS | Status: AC
  Administered 2021-12-30: 1000 mL via INTRAVENOUS

## 2021-12-30 MED ORDER — METHYLPREDNISOLONE 4 MG PO TBPK
ORAL_TABLET | ORAL | 0 refills | Status: DC
  Filled 2021-12-30: qty 21, 6d supply, fill #0

## 2021-12-30 MED ORDER — SODIUM CHLORIDE 0.9 % IV SOLN: INTRAVENOUS | Status: DC | PRN

## 2021-12-30 MED ORDER — SODIUM CHLORIDE 0.9 % IV SOLN
3.0000 g | Freq: Once | INTRAVENOUS | Status: AC
  Administered 2021-12-30: 3 g via INTRAVENOUS

## 2021-12-30 MED ORDER — CLINDAMYCIN HCL 300 MG PO CAPS
300.0000 mg | ORAL_CAPSULE | Freq: Four times a day (QID) | ORAL | 0 refills | Status: AC
  Filled 2021-12-30: qty 40, 10d supply, fill #0

## 2021-12-30 MED ORDER — DEXAMETHASONE SODIUM PHOSPHATE 10 MG/ML IJ SOLN
10.0000 mg | Freq: Once | INTRAMUSCULAR | Status: AC
  Administered 2021-12-30: 10 mg via INTRAVENOUS
  Filled 2021-12-30: qty 1

## 2021-12-30 NOTE — ED Notes (Signed)
RN provided AVS using Teachback Method. Patient verbalizes understanding of Discharge Instructions. Opportunity for Questioning and Answers were provided by RN. Patient Discharged from ED ambulatory to Home via Self.  

## 2021-12-30 NOTE — ED Notes (Signed)
IR/WL paged 12:14 AOM

## 2021-12-30 NOTE — ED Triage Notes (Signed)
Sore throat 3 days, saw here, negative for covid and strep. Symptoms worse, cant talk in triage barely, seen at urgent care yesterday dx with peritonsillar abscess ,sat at [facility] cone for 8 hours, unseen.

## 2021-12-30 NOTE — ED Provider Notes (Signed)
[facility] EMERGENCY DEPT Provider Note   CSN: [id number] Arrival date & time: 12/30/21  1011     History  Chief Complaint  Patient presents with   Sore Throat    Jacob Holder is a 31 y.o. male.  Sore throat for the last 3 days.  Now having some difficulty swallowing.  Change in his voice.  Was seen here several days ago and had negative strep and COVID test.  Not on any antibiotics.  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  Denies any fevers or chills.  Denies any concern for STDs.  Nothing has made it worse or better.  Denies any headache or neck pain or shortness of breath.  The history is provided by the patient.       Home Medications Prior to Admission medications   Medication Sig Start Date End Date Taking? Authorizing Provider  clindamycin (CLEOCIN) 300 MG capsule Take 1 capsule (300 mg total) by mouth 4 (four) times daily for 10 days. 12/30/21 01/09/22 Yes Yocelin Vanlue, DO  methylPREDNISolone (MEDROL DOSEPAK) 4 MG TBPK tablet Follow package insert 12/30/21  Yes Felishia Wartman, DO  aspirin 81 MG chewable tablet Chew 81 mg by mouth daily.    [provider]  atorvastatin (LIPITOR) 40 MG tablet Take 1 tablet (40 mg total) by mouth daily at 6 PM. 07/04/19   Black, Lesle Chris, NP  bictegravir-emtricitabine-tenofovir AF (BIKTARVY) 50-200-25 MG TABS tablet Take 1 tablet by mouth daily. 07/13/21   Blanchard Kelch, NP  clopidogrel (PLAVIX) 75 MG tablet Take 75 mg by mouth daily.    [provider]  escitalopram (LEXAPRO) 10 MG tablet Take 1 tablet (10 mg total) by mouth daily. 07/13/21   Blanchard Kelch, NP  magic mouthwash (nystatin, lidocaine, diphenhydrAMINE, alum & mag hydroxide) suspension Swish and swallow 5 mLs 4 (four) times daily. 12/29/21   Palumbo, April, MD  predniSONE (DELTASONE) 20 MG tablet Take 2 tablets (40 mg total) by mouth daily. 01/18/21   Mardella Layman, MD  traZODone (DESYREL) 50 MG tablet Take 1-2 tablets (50-100 mg total) by mouth at  bedtime. 07/13/21   Blanchard Kelch, NP  verapamil (CALAN) 80 MG tablet Take 1 tablet (80 mg total) by mouth 3 (three) times daily. Patient not taking: Reported on 06/01/2021 02/20/19   Drema Dallas, DO      Allergies    Patient has no known allergies.    Review of Systems   Review of Systems  Physical Exam Updated Vital Signs BP (!) 155/87 (BP Location: Right Arm)   Pulse (!) 111   Temp 98.2 F (36.8 C)   Resp 14   SpO2 100%  Physical Exam Vitals and nursing note reviewed.  Constitutional:      General: He is not in acute distress.    Appearance: He is well-developed.  HENT:     Head: Normocephalic and atraumatic.     Right Ear: Tympanic membrane normal.     Left Ear: Tympanic membrane normal.     Mouth/Throat:     Mouth: Mucous membranes are moist.     Pharynx: Pharyngeal swelling, oropharyngeal exudate and posterior oropharyngeal erythema present.     Tonsils: Tonsillar exudate present. 2+ on the right. 3+ on the left.  Eyes:     Conjunctiva/sclera: Conjunctivae normal.  Cardiovascular:     Rate and Rhythm: Normal rate and regular rhythm.     Heart sounds: No murmur heard. Pulmonary:     Effort: Pulmonary effort is normal.  No respiratory distress.     Breath sounds: Normal breath sounds.  Abdominal:     Palpations: Abdomen is soft.     Tenderness: There is no abdominal tenderness.  Musculoskeletal:        General: No swelling.     Cervical back: Normal range of motion and neck supple.  Lymphadenopathy:     Cervical: Cervical adenopathy present.  Skin:    General: Skin is warm and dry.     Capillary Refill: Capillary refill takes less than 2 seconds.  Neurological:     Mental Status: He is alert.  Psychiatric:        Mood and Affect: Mood normal.     ED Results / Procedures / Treatments   Labs (all labs ordered are listed, but only abnormal results are displayed) Labs Reviewed  CBC WITH DIFFERENTIAL/PLATELET - Abnormal; Notable for the following  components:      Result Value   WBC 23.2 (*)    Hemoglobin 12.8 (*)    Platelets 62 (*)    Neutro Abs 19.4 (*)    Monocytes Absolute 1.3 (*)    Abs Immature Granulocytes 0.21 (*)    All other components within normal limits  BASIC METABOLIC PANEL - Abnormal; Notable for the following components:   Potassium 3.4 (*)    All other components within normal limits  GROUP A STREP BY PCR  GC/CHLAMYDIA PROBE AMP ([place]) NOT AT [facility]    EKG None  Radiology CT Soft Tissue Neck W Contrast  Result Date: 12/30/2021 CLINICAL DATA:  Sore throat over the last 3 days. Worsening symptoms. Unable to speak. Epiglottitis or tonsillitis suspected. EXAM: CT NECK WITH CONTRAST TECHNIQUE: Multidetector CT imaging of the neck was performed using the standard protocol following the bolus administration of intravenous contrast. RADIATION DOSE REDUCTION: This exam was performed according to the departmental dose-optimization program which includes automated exposure control, adjustment of the mA and/or kV according to patient size and/or use of iterative reconstruction technique. CONTRAST:  33mL OMNIPAQUE IOHEXOL 300 MG/ML  SOLN COMPARISON:  CT 07/03/2019 FINDINGS: Pharynx and larynx: Swelling and edematous change of the tonsils left more than right consistent with asymmetric tonsillitis. No definable tonsillar or peritonsillar abscess. Mild edematous change in the parapharyngeal space on the left without frank collection. The epiglottis is normal. Salivary glands: Parotid and submandibular glands are normal. Thyroid: Normal Lymph nodes: Mild reactive prominence of level 2 lymph nodes left more than right but without suppuration. Vascular: Normal in the neck. 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 There appears to be worsening dilatation of the distal basilar artery proximal to the terminus, diameter having increased to 8 mm. Re-evaluation by the  patient's vascular specialists would be recommended. Visualized orbits: Normal Mastoids and visualized paranasal sinuses: Clear Skeleton: Normal Upper chest: Clear Other: None IMPRESSION: Tonsillitis, asymmetrically more pronounced on the left. No definable tonsillar or peritonsillar abscess. There is some inflammatory edema in the parapharyngeal space on the left but no localized or drainable collection. Mild reactive prominence of the level 2 nodes left more than right but without suppuration. 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 There appears to be further dilatation of the distal basilar artery just proximal to the bifurcation, now measuring 8 mm. Re-evaluation by the patient's vascular specialists would be recommended. Electronically Signed   By: Paulina Fusi M.D.   On: 12/30/2021 12:01    Procedures Procedures    Medications Ordered  in ED Medications  0.9 %  sodium chloride infusion (0 mLs Intravenous Stopped 12/30/21 1202)  Ampicillin-Sulbactam (UNASYN) 3 g in sodium chloride 0.9 % 100 mL IVPB (0 g Intravenous Stopped 12/30/21 1202)  sodium chloride 0.9 % bolus 1,000 mL (1,000 mLs Intravenous New Bag/Given 12/30/21 1158)  iohexol (OMNIPAQUE) 300 MG/ML solution 100 mL (75 mLs Intravenous Contrast Given 12/30/21 1139)  dexamethasone (DECADRON) injection 10 mg (10 mg Intravenous Given 12/30/21 1200)    ED Course/ Medical Decision Making/ A&P                           Medical Decision Making Amount and/or Complexity of Data Reviewed Labs: ordered. Radiology: ordered.  Risk Prescription drug management.   Jacob Holder is here with sore throat.  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  Normal vitals.  Sore throat for the last 3 days.  Now with difficulty swallowing and pain and change in voice.  He has large exudates on bilateral tonsils with left tonsil more swollen than the right.  Uvula is midline.  No trismus.  Does have significant cervical adenopathy.  We will get a CT scan to  further evaluate for tonsillar abscess versus tonsillitis versus retropharyngeal abscess.  Will swab again for strep and gonorrhea and chlamydia.  We will give a dose IV Unasyn and IV fluids.  We will check basic labs.  CT shows tonsillitis with asymmetry more pronounced in the left than the right but there is no definable abscess.  There is some inflammation and edema in the parapharyngeal space on the left but no localized drainable collection.  Incidentally we were able to also see his area of previous stent site of basilar aneurysm which appears to be stable.  However his other area of aneurysm just proximal to the bifurcation is now measuring 8 mm.  There is some slight increase now in dilation from prior.  Talked with Dr. Elby Showers with interventional radiology and will help arrange for outpatient follow-up.  I did offer patient admission for further IV antibiotics and steroids and hydration to make sure that he improves but he declined.  At this time he is safe for discharge and will trial oral steroids and antibiotics at home.  Understands return precautions.  Given information to follow-up with interventional radiology and ENT.  This chart was dictated using voice recognition software.  Despite best efforts to proofread,  errors can occur which can change the documentation meaning.         Final Clinical Impression(s) / ED Diagnoses Final diagnoses:  Tonsillitis    Rx / DC Orders ED Discharge Orders          Ordered    clindamycin (CLEOCIN) 300 MG capsule  4 times daily        12/30/21 1219    methylPREDNISolone (MEDROL DOSEPAK) 4 MG TBPK tablet        12/30/21 1219              Virgina Norfolk, DO 12/30/21 1236

## 2021-12-30 NOTE — Discharge Instructions (Addendum)
Take next dose antibiotic at dinnertime, take next dose of steroid tomorrow.  Follow-up with ear nose and throat doctor return to the ED symptoms worsen acutely.  I would expect improvement over the next 48 hours.  If you are having any worsening difficulty opening mouth or notice any significant change in your symptoms please return for evaluation.  Recommend that you follow-up at [facility] or [facility] where ENT specialist can evaluate you.  Also as discussed, follow-up with interventional radiology, Dr. Launa Flight regarding your basilar artery stent.  There seems to be may be some minimal dilation of other aneurysm.  They should be calling you with an appointment but recommend that you call to confirm.

## 2021-12-31 LAB — GC/CHLAMYDIA PROBE AMP (~~LOC~~) NOT AT ARMC
Chlamydia: NEGATIVE
Comment: NEGATIVE
Comment: NORMAL
Neisseria Gonorrhea: NEGATIVE

## 2022-01-03 ENCOUNTER — Other Ambulatory Visit (HOSPITAL_COMMUNITY): Payer: Self-pay | Admitting: Interventional Radiology

## 2022-01-03 ENCOUNTER — Telehealth (HOSPITAL_COMMUNITY): Payer: Self-pay

## 2022-01-03 DIAGNOSIS — I671 Cerebral aneurysm, nonruptured: Secondary | ICD-10-CM

## 2022-01-03 NOTE — Telephone Encounter (Signed)
Called to schedule consult, no answer, left vm. AW  

## 2022-01-10 ENCOUNTER — Telehealth (HOSPITAL_COMMUNITY): Payer: Self-pay

## 2022-01-10 NOTE — Telephone Encounter (Signed)
2nd attempt to contact pt for f/u, no answer, left vm. AW

## 2022-01-21 ENCOUNTER — Ambulatory Visit (HOSPITAL_COMMUNITY): Payer: No Typology Code available for payment source

## 2022-01-28 ENCOUNTER — Ambulatory Visit (HOSPITAL_COMMUNITY): Admission: RE | Admit: 2022-01-28 | Payer: No Typology Code available for payment source | Source: Ambulatory Visit

## 2022-04-29 ENCOUNTER — Ambulatory Visit: Payer: Self-pay | Admitting: Infectious Diseases

## 2022-05-17 ENCOUNTER — Ambulatory Visit: Payer: Self-pay

## 2022-05-24 ENCOUNTER — Ambulatory Visit: Payer: Self-pay

## 2023-01-07 ENCOUNTER — Ambulatory Visit (HOSPITAL_COMMUNITY)
Admission: EM | Admit: 2023-01-07 | Discharge: 2023-01-07 | Disposition: A | Discharge status: Home / Self Care | Payer: No Typology Code available for payment source | Attending: Emergency Medicine | Admitting: Emergency Medicine

## 2023-01-07 ENCOUNTER — Encounter (HOSPITAL_COMMUNITY): Payer: Self-pay

## 2023-01-07 DIAGNOSIS — M25511 Pain in right shoulder: Secondary | ICD-10-CM | POA: Diagnosis not present

## 2023-01-07 MED ORDER — KETOROLAC TROMETHAMINE 30 MG/ML IJ SOLN
30.0000 mg | Freq: Once | INTRAMUSCULAR | Status: AC
  Administered 2023-01-07: 30 mg via INTRAMUSCULAR

## 2023-01-07 MED ORDER — DEXAMETHASONE SODIUM PHOSPHATE 10 MG/ML IJ SOLN
INTRAMUSCULAR | Status: AC
  Filled 2023-01-07: qty 1

## 2023-01-07 MED ORDER — CYCLOBENZAPRINE HCL 10 MG PO TABS
10.0000 mg | ORAL_TABLET | Freq: Two times a day (BID) | ORAL | 0 refills | Status: DC | PRN
Start: 2023-01-07 — End: 2023-01-18

## 2023-01-07 MED ORDER — PREDNISONE 20 MG PO TABS: 40.0000 mg | ORAL_TABLET | Freq: Every day | ORAL | 0 refills | Status: DC

## 2023-01-07 MED ORDER — KETOROLAC TROMETHAMINE 30 MG/ML IJ SOLN
INTRAMUSCULAR | Status: AC
  Filled 2023-01-07: qty 1

## 2023-01-07 MED ORDER — DEXAMETHASONE SODIUM PHOSPHATE 10 MG/ML IJ SOLN
10.0000 mg | Freq: Once | INTRAMUSCULAR | Status: AC
  Administered 2023-01-07: 10 mg via INTRAMUSCULAR

## 2023-01-07 NOTE — ED Triage Notes (Signed)
Pt reports he was sleep and when he woke up his right shoulder was hurting and has since radiated to his right arm and fingers x 2 days. States he felt a shock in his arm and now has numbness in his right arm/shoulder making it hard to move past rest. Back pain reported.   Has tried [employer] balm ibuprofen

## 2023-01-07 NOTE — Discharge Instructions (Signed)
Your pain is most likely caused by irritation to the muscles.  You have been given an injection of Decadron and Toradol today here in the office both medicines help to reduce inflammation and ideally will start to tone down some of your pain in the next 30 minutes to an hour  Starting tomorrow take prednisone every morning with food for 5 days to continue the above process, you may use Tylenol in addition to this as needed  You may use muscle relaxant twice daily as needed for comfort, be mindful this medication can make you feel drowsy and is most  You may use heating pad in 15 minute intervals as needed for additional comfort, patient does not, she is you may find comfort in using ice in 10-15 minutes over affected area  Begin stretching affected area daily for 10 minutes as tolerated to further loosen muscles   When sitting and lying place pillows underneath arm and behind back  Can try sleeping without pillow on firm mattress   Practice good posture: head back, shoulders back, chest forward, pelvis back and weight distributed evenly on both legs  If pain persist after recommended treatment or reoccurs if may be beneficial to follow up with orthopedic specialist for evaluation, this doctor specializes in the bones and can manage your symptoms long-term with options such as but not limited to imaging, medications or physical therapy

## 2023-01-07 NOTE — ED Provider Notes (Signed)
[facility]    CSN: [id number] Arrival date & time: 01/07/23  1142      History   Chief Complaint No chief complaint on file.   HPI Jacob Holder is a 32 y.o. male.   Patient presents for evaluation of right shoulder pain present for 2 days, symptoms progressively worsening.  Has begun to radiate into the right arm with associated numbness causing a weakness and difficulty gripping to the right hand.  Prior to numbness beginning felt a shock in his right side.  But denies precipitating event, change in activity, injury or trauma.  Has attempted use of NSAIDs, Tiger balm, IcyHot.  History of CVA.  Denies visual disturbance, dizziness, lightheadedness, headache, chest pain, shortness of breath.    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/26/2019   Essential hypertension     Past Surgical History:  Procedure Laterality Date   IR ANGIO INTRA EXTRACRAN SEL COM CAROTID INNOMINATE BILAT MOD SED  03/05/2019   IR ANGIO VERTEBRAL SEL VERTEBRAL BILAT MOD SED  03/05/2019   IR ANGIO VERTEBRAL SEL VERTEBRAL UNI R MOD SED  06/26/2019   IR ANGIOGRAM FOLLOW UP STUDY  06/26/2019   IR NEURO EACH ADD'L AFTER BASIC UNI RIGHT (MS)  06/26/2019   IR TRANSCATH/EMBOLIZ  06/26/2019   RADIOLOGY WITH ANESTHESIA N/A 06/26/2019   Procedure: EMBOLIZATION;  Surgeon: Julieanne Cotton, MD;  Location: [facility] OR;  Service:  Radiology;  Laterality: N/A;   RECTAL SURGERY     TOOTH EXTRACTION         Home Medications    Prior to Admission medications   Medication Sig Start Date End Date Taking? Authorizing Provider  aspirin 81 MG chewable tablet Chew 81 mg by mouth daily.    [provider]  atorvastatin (LIPITOR) 40 MG tablet Take 1 tablet (40 mg total) by mouth daily at 6 PM. 07/04/19   Black, Lesle Chris, NP  bictegravir-emtricitabine-tenofovir AF (BIKTARVY) 50-200-25 MG TABS tablet Take 1 tablet by mouth daily. 07/13/21   Blanchard Kelch, NP  clopidogrel (PLAVIX) 75 MG tablet Take 75 mg by mouth daily.    [provider]  escitalopram (LEXAPRO) 10 MG tablet Take 1 tablet (10 mg total) by mouth daily. 07/13/21   Blanchard Kelch, NP  magic mouthwash (nystatin, lidocaine, diphenhydrAMINE, alum & mag hydroxide) suspension Swish and swallow 5 mLs 4 (four) times daily. 12/29/21   Palumbo, April, MD  methylPREDNISolone (MEDROL DOSEPAK) 4 MG TBPK tablet Follow package insert 12/30/21   Curatolo, Adam, DO  predniSONE (DELTASONE) 20 MG tablet Take 2 tablets (40 mg total) by mouth daily. 01/18/21   Mardella Layman, MD  traZODone (DESYREL) 50 MG tablet Take 1-2 tablets (50-100 mg total) by mouth at bedtime. 07/13/21   Blanchard Kelch, NP  verapamil (CALAN) 80 MG tablet Take 1 tablet (  80 mg total) by mouth 3 (three) times daily. Patient not taking: Reported on 06/01/2021 02/20/19   Drema Dallas, DO    Family History Family History  Problem Relation Age of Onset   Healthy Mother    Thyroid disease Mother    Diabetes Father     Social History Social History   Tobacco Use   Smoking status: Every Day    Packs/day: .3    Types: Cigarettes   Smokeless tobacco: Never  Vaping Use   Vaping Use: Never used  Substance Use Topics   Alcohol use: Yes    Comment: from time to time   Drug use: No     Allergies   Patient has no known allergies.   Review of Systems Review of Systems   Physical  Exam Triage Vital Signs ED Triage Vitals  Enc Vitals Group     BP 01/07/23 1209 121/80     Pulse Rate 01/07/23 1209 90     Resp 01/07/23 1209 18     Temp 01/07/23 1209 98.9 F (37.2 C)     Temp Source 01/07/23 1209 Oral     SpO2 01/07/23 1209 98 %     Weight --      Height --      Head Circumference --      Peak Flow --      Pain Score 01/07/23 1210 10     Pain Loc --      Pain Edu? --      Excl. in GC? --    No data found.  Updated Vital Signs BP 121/80 (BP Location: Left Arm)   Pulse 90   Temp 98.9 F (37.2 C) (Oral)   Resp 18   SpO2 98%   Visual Acuity Right Eye Distance:   Left Eye Distance:   Bilateral Distance:    Right Eye Near:   Left Eye Near:    Bilateral Near:     Physical Exam Constitutional:      Appearance: Normal appearance.  Eyes:     Extraocular Movements: Extraocular movements intact.  Pulmonary:     Effort: Pulmonary effort is normal.  Musculoskeletal:     Comments: Tenderness is present along the superior aspect of the right shoulder without ecchymosis swelling or deformity, no tenderness to the anterior or posterior aspect, limited range of motion, unable to fully laterally extend, unable to assess Hawkins sign, 2+ brachial pulse, strength is a 4 out of 5  Neurological:     General: No focal deficit present.     Mental Status: He is alert and oriented to person, place, and time. Mental status is at baseline.     Cranial Nerves: No cranial nerve deficit.     Sensory: No sensory deficit.     Motor: No weakness.      UC Treatments / Results  Labs (all labs ordered are listed, but only abnormal results are displayed) Labs Reviewed - No data to display  EKG   Radiology No results found.  Procedures Procedures (including critical care time)  Medications Ordered in UC Medications - No data to display  Initial Impression / Assessment and Plan / UC Course  I have reviewed the triage vital signs and the nursing notes.  Pertinent  labs & imaging results that were available during my care of the patient were reviewed by me and considered in my medical decision making (see chart for details).  Acute right shoulder pain  Symptoms are most  likely muscular, no neurological deficit indicating stroke, discussed this with patient, Toradol and Decadron injection given in office and prescribed prednisone and Flexeril for outpatient use, sling given in office as patient has limited range of motion, recommended RICE heat massage stretching and activity as tolerated, walking referral to orthopedics if symptoms persist or worsen Final Clinical Impressions(s) / UC Diagnoses   Final diagnoses:  None   Discharge Instructions   None    ED Prescriptions   None    PDMP not reviewed this encounter.   Valinda Hoar, NP 01/07/23 1257

## 2023-01-17 ENCOUNTER — Encounter (HOSPITAL_BASED_OUTPATIENT_CLINIC_OR_DEPARTMENT_OTHER): Payer: Self-pay

## 2023-01-17 ENCOUNTER — Emergency Department (HOSPITAL_BASED_OUTPATIENT_CLINIC_OR_DEPARTMENT_OTHER)
Admission: EM | Admit: 2023-01-17 | Discharge: 2023-01-18 | Disposition: A | Discharge status: Home / Self Care | Payer: No Typology Code available for payment source | Attending: Emergency Medicine | Admitting: Emergency Medicine

## 2023-01-17 ENCOUNTER — Emergency Department (HOSPITAL_BASED_OUTPATIENT_CLINIC_OR_DEPARTMENT_OTHER): Payer: No Typology Code available for payment source | Admitting: Radiology

## 2023-01-17 ENCOUNTER — Other Ambulatory Visit: Payer: Self-pay

## 2023-01-17 DIAGNOSIS — Z7982 Long term (current) use of aspirin: Secondary | ICD-10-CM | POA: Diagnosis not present

## 2023-01-17 DIAGNOSIS — Z7902 Long term (current) use of antithrombotics/antiplatelets: Secondary | ICD-10-CM | POA: Insufficient documentation

## 2023-01-17 DIAGNOSIS — M25511 Pain in right shoulder: Secondary | ICD-10-CM | POA: Insufficient documentation

## 2023-01-17 LAB — CBC
HCT: 40.6 % (ref 39.0–52.0)
Hemoglobin: 12.9 g/dL — ABNORMAL LOW (ref 13.0–17.0)
MCH: 27.7 pg (ref 26.0–34.0)
MCHC: 31.8 g/dL (ref 30.0–36.0)
MCV: 87.1 fL (ref 80.0–100.0)
Platelets: 300 10*3/uL (ref 150–400)
RBC: 4.66 MIL/uL (ref 4.22–5.81)
RDW: 14.3 % (ref 11.5–15.5)
WBC: 7.8 10*3/uL (ref 4.0–10.5)
nRBC: 0 % (ref 0.0–0.2)

## 2023-01-17 LAB — BASIC METABOLIC PANEL
Anion gap: 11 (ref 5–15)
BUN: 10 mg/dL (ref 6–20)
CO2: 24 mmol/L (ref 22–32)
Calcium: 9.2 mg/dL (ref 8.9–10.3)
Chloride: 100 mmol/L (ref 98–111)
Creatinine, Ser: 0.69 mg/dL (ref 0.61–1.24)
GFR, Estimated: >60 mL/min (ref >60)
Glucose, Bld: 112 mg/dL — ABNORMAL HIGH (ref 70–99)
Potassium: 3.6 mmol/L (ref 3.5–5.1)
Sodium: 135 mmol/L (ref 135–145)

## 2023-01-17 LAB — TROPONIN I (HIGH SENSITIVITY): Troponin I (High Sensitivity): 3 ng/L (ref <18)

## 2023-01-17 NOTE — ED Triage Notes (Signed)
Patient here POV from Home.  Endorses Right Sided Shoulder Pain that radiates down arm. Began 10 Days ago. Has been seen and treated by Orthopedics and UC. Seeks Evaluation for continued pain and new pain in chest and back  Steroid Injection without relief. Scheduled for MRI on Thursday.   NAD Noted during Triage. A&Ox4. GCS 15. Ambulatory.

## 2023-01-18 ENCOUNTER — Emergency Department (HOSPITAL_BASED_OUTPATIENT_CLINIC_OR_DEPARTMENT_OTHER): Payer: No Typology Code available for payment source

## 2023-01-18 LAB — TROPONIN I (HIGH SENSITIVITY): Troponin I (High Sensitivity): 3 ng/L (ref <18)

## 2023-01-18 MED ORDER — IOHEXOL 350 MG/ML SOLN
100.0000 mL | Freq: Once | INTRAVENOUS | Status: AC | PRN
  Administered 2023-01-18: 100 mL via INTRAVENOUS

## 2023-01-18 MED ORDER — MELOXICAM 7.5 MG PO TABS
15.0000 mg | ORAL_TABLET | Freq: Once | ORAL | Status: AC
  Administered 2023-01-18: 15 mg via ORAL
  Filled 2023-01-18: qty 2

## 2023-01-18 MED ORDER — DIAZEPAM 5 MG PO TABS
5.0000 mg | ORAL_TABLET | Freq: Once | ORAL | Status: AC
  Administered 2023-01-18: 5 mg via ORAL
  Filled 2023-01-18: qty 1

## 2023-01-18 MED ORDER — DIAZEPAM 5 MG PO TABS: 5.0000 mg | ORAL_TABLET | Freq: Three times a day (TID) | ORAL | 0 refills | Status: AC | PRN

## 2023-01-18 MED ORDER — HYDROMORPHONE HCL 1 MG/ML IJ SOLN
1.0000 mg | Freq: Once | INTRAMUSCULAR | Status: AC
  Administered 2023-01-18: 1 mg via INTRAVENOUS
  Filled 2023-01-18: qty 1

## 2023-01-18 MED ORDER — OXYCODONE-ACETAMINOPHEN 5-325 MG PO TABS: 1.0000 | ORAL_TABLET | Freq: Three times a day (TID) | ORAL | 0 refills | Status: AC | PRN

## 2023-01-18 MED ORDER — LIDOCAINE 5 % EX PTCH
1.0000 | MEDICATED_PATCH | CUTANEOUS | Status: DC
  Administered 2023-01-18: 1 via TRANSDERMAL
  Filled 2023-01-18: qty 1

## 2023-01-18 NOTE — ED Provider Notes (Signed)
[place] EMERGENCY DEPARTMENT AT [facility] Provider Note   CSN: [id number] Arrival date & time: 01/17/23  2033     History  Chief Complaint  Patient presents with   Shoulder Pain    Jacob Holder is a 32 y.o. male.  Patient presents to the ER today with right-sided shoulder and arm pain.  Soundly patient has been seen in urgent care twice and by orthopedics once over the last couple weeks for the same symptoms but they have progressively worsened.  States initially started with significant pain over his right trapezius that started when he woke up in the morning and has progressively worsened to now have right posterior and lateral shoulder pain worse with let it hang while standing or worse with trying to move it.  No injuries.  Has had cortisone shots, muscle relaxers, topical applications all of which did not seem to help.  Has MRI scheduled for Thursday.  Has a history of a couple aneurysms in his brain status post stenting to one of them.  Worries about a possible stroke or bleed or complication secondary to these and the right-sided symptoms.   Shoulder Pain      Home Medications Prior to Admission medications   Medication Sig Start Date End Date Taking? Authorizing Provider  diazepam (VALIUM) 5 MG tablet Take 1 tablet (5 mg total) by mouth every 8 (eight) hours as needed for muscle spasms. 01/18/23  Yes Gissell Barra, Barbara Cower, MD  oxyCODONE-acetaminophen (PERCOCET) 5-325 MG tablet Take 1-2 tablets by mouth every 8 (eight) hours as needed. 01/18/23  Yes Cole Eastridge, Barbara Cower, MD  aspirin 81 MG chewable tablet Chew 81 mg by mouth daily.    [provider]  atorvastatin (LIPITOR) 40 MG tablet Take 1 tablet (40 mg total) by mouth daily at 6 PM. 07/04/19   Black, Lesle Chris, NP  bictegravir-emtricitabine-tenofovir AF (BIKTARVY) 50-200-25 MG TABS tablet Take 1 tablet by mouth daily. 07/13/21   Blanchard Kelch, NP  clopidogrel (PLAVIX) 75 MG tablet Take 75 mg by mouth daily.     [provider]  escitalopram (LEXAPRO) 10 MG tablet Take 1 tablet (10 mg total) by mouth daily. 07/13/21   Blanchard Kelch, NP  traZODone (DESYREL) 50 MG tablet Take 1-2 tablets (50-100 mg total) by mouth at bedtime. 07/13/21   Blanchard Kelch, NP      Allergies    Patient has no known allergies.    Review of Systems   Review of Systems  Physical Exam Updated Vital Signs BP (!) 131/93   Pulse 92   Temp 98.2 F (36.8 C)   Resp 16   Ht 5\' 11"  (1.803 m)   Wt 86.2 kg   SpO2 98%   BMI 26.50 kg/m  Physical Exam Vitals and nursing note reviewed.  Constitutional:      Appearance: He is well-developed.  HENT:     Head: Normocephalic and atraumatic.     Mouth/Throat:     Mouth: Mucous membranes are moist.  Eyes:     Pupils: Pupils are equal, round, and reactive to light.  Cardiovascular:     Rate and Rhythm: Normal rate.  Pulmonary:     Effort: Pulmonary effort is normal. No respiratory distress.  Abdominal:     General: There is no distension.  Musculoskeletal:        General: Tenderness (Over right rotator cuff, trapezius, worse with active range of motion.  No overlying erythema, induration.) present. Normal range of motion.  Cervical back: Normal range of motion.  Skin:    General: Skin is warm and dry.  Neurological:     General: No focal deficit present.     Mental Status: He is alert.     ED Results / Procedures / Treatments   Labs (all labs ordered are listed, but only abnormal results are displayed) Labs Reviewed  BASIC METABOLIC PANEL - Abnormal; Notable for the following components:      Result Value   Glucose, Bld 112 (*)    All other components within normal limits  CBC - Abnormal; Notable for the following components:   Hemoglobin 12.9 (*)    All other components within normal limits  TROPONIN I (HIGH SENSITIVITY)  TROPONIN I (HIGH SENSITIVITY)    EKG EKG Interpretation Date/Time:  Tuesday January 17 2023 20:49:31 EDT Ventricular  Rate:  98 PR Interval:  132 QRS Duration:  110 QT Interval:  354 QTC Calculation: 451 R Axis:   42  Text Interpretation: Normal sinus rhythm Nonspecific T wave abnormality Abnormal ECG When compared with ECG of 02-Jul-2019 15:39, No significant change was found Confirmed by Marily Memos [phone]) on 01/18/2023 12:41:11 AM  Radiology CT ANGIO HEAD NECK W WO CM  Result Date: 01/18/2023 CLINICAL DATA:  Right shoulder pain radiating to the right arm EXAM: CT ANGIOGRAPHY HEAD AND NECK WITH AND WITHOUT CONTRAST TECHNIQUE: Multidetector CT imaging of the head and neck was performed using the standard protocol during bolus administration of intravenous contrast. Multiplanar CT image reconstructions and MIPs were obtained to evaluate the vascular anatomy. Carotid stenosis measurements (when applicable) are obtained utilizing NASCET criteria, using the distal internal carotid diameter as the denominator. RADIATION DOSE REDUCTION: This exam was performed according to the departmental dose-optimization program which includes automated exposure control, adjustment of the mA and/or kV according to patient size and/or use of iterative reconstruction technique. CONTRAST:  OMNIPAQUE IOHEXOL 350 MG/ML SOLN COMPARISON:  12/30/2021 FINDINGS: CT HEAD FINDINGS Brain: There is no mass, hemorrhage or extra-axial collection. The size and configuration of the ventricles and extra-axial CSF spaces are normal. There are subacute to chronic infarcts of the posterior parietal lobes bilaterally. Old right cerebellar infarct. The brain parenchyma is normal. Skull: The visualized skull base, calvarium and extracranial soft tissues are normal. Sinuses/Orbits: No fluid levels or advanced mucosal thickening of the visualized paranasal sinuses. No mastoid or middle ear effusion. The orbits are normal. CTA NECK FINDINGS SKELETON: There is no bony spinal canal stenosis. No lytic or blastic lesion. OTHER NECK: Normal pharynx, larynx and  major salivary glands. No cervical lymphadenopathy. Unremarkable thyroid gland. UPPER CHEST: No pneumothorax or pleural effusion. No nodules or masses. AORTIC ARCH: There is no calcific atherosclerosis of the aortic arch. Conventional 3 vessel aortic branching pattern. RIGHT CAROTID SYSTEM: Normal without aneurysm, dissection or stenosis. LEFT CAROTID SYSTEM: Normal without aneurysm, dissection or stenosis. VERTEBRAL ARTERIES: Left dominant configuration.There is no dissection, occlusion or flow-limiting stenosis to the skull base (V1-V3 segments). CTA HEAD FINDINGS POSTERIOR CIRCULATION: --Vertebral arteries: Normal V4 segments. --Inferior cerebellar arteries: Normal. 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 Unchanged position of stent. Maximum aneurysmal diameter is 8 mm. --Superior cerebellar arteries: Normal. --Posterior cerebral arteries (PCA): Normal. ANTERIOR CIRCULATION: --Intracranial internal carotid arteries: Normal. --Anterior cerebral arteries (ACA): Normal. Both A1 segments are present. Patent anterior communicating artery (a-comm). --Middle cerebral arteries (MCA): Normal. VENOUS SINUSES: As permitted by contrast timing, patent. ANATOMIC VARIANTS: None Review of the MIP  images confirms the above findings. IMPRESSION: 1. No emergent large vessel occlusion or high-grade stenosis of the intracranial arteries. 2. 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 Unchanged position of stent. 3. 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 Electronically Signed   By: Deatra Robinson M.D.   On: 01/18/2023 02:07   CT SHOULDER RIGHT W CONTRAST  Result Date: 01/18/2023 CLINICAL DATA:  Shoulder pain, chronic, rotator cuff disorder suspected, EXAM: CT OF THE UPPER RIGHT EXTREMITY WITH CONTRAST TECHNIQUE: Multidetector CT imaging of the upper right extremity was performed  according to the standard protocol following intravenous contrast administration. RADIATION DOSE REDUCTION: This exam was performed according to the departmental dose-optimization program which includes automated exposure control, adjustment of the mA and/or kV according to patient size and/or use of iterative reconstruction technique. CONTRAST:  OMNIPAQUE IOHEXOL 350 MG/ML SOLN COMPARISON:  None Available. FINDINGS: Bones/Joint/Cartilage No acute fracture or dislocation. Ligaments Suboptimally assessed by CT. Muscles and Tendons No definite acute abnormality. Normal muscle bulk. MRI is more sensitive for rotator cuff pathology. Soft tissues Unremarkable. Prominent subcentimeter axillary lymph nodes are likely reactive. IMPRESSION: No acute fracture or dislocation. Electronically Signed   By: Minerva Fester M.D.   On: 01/18/2023 01:45   DG Chest 2 View  Result Date: 01/17/2023 CLINICAL DATA:  Chest pain.  Right-sided shoulder pain. EXAM: CHEST - 2 VIEW COMPARISON:  06/27/2019. FINDINGS: The heart size and mediastinal contours are within normal limits. No consolidation, effusion, or pneumothorax. No acute osseous abnormality. IMPRESSION: No active cardiopulmonary disease. Electronically Signed   By: Thornell Sartorius M.D.   On: 01/17/2023 21:46    Procedures Procedures    Medications Ordered in ED Medications  lidocaine (LIDODERM) 5 % 1 patch (1 patch Transdermal Patch Applied 01/18/23 0339)  HYDROmorphone (DILAUDID) injection 1 mg (1 mg Intravenous Given 01/18/23 0111)  iohexol (OMNIPAQUE) 350 MG/ML injection 100 mL (100 mLs Intravenous Contrast Given 01/18/23 0121)  meloxicam (MOBIC) tablet 15 mg (15 mg Oral Given 01/18/23 0339)  diazepam (VALIUM) tablet 5 mg (5 mg Oral Given 01/18/23 2841)    ED Course/ Medical Decision Making/ A&P                             Medical Decision Making Amount and/or Complexity of Data Reviewed Labs: ordered. Radiology: ordered.  Risk Prescription drug  management.   Symptoms certainly sound muscular in nature however they seem to be worsening over time and not improved like you to expect.  He has a definitive testing such as an MRI to be done in a couple days that would do a good job look for rotator cuff injuries or other abnormalities however with his history and atraumatic pain CT angio of his head and neck were done and shoulder CT to look for occult injury were also done.  These were negative.  Will treat with short course of pain medicine and muscle relaxers until you see orthopedics in a couple days.  Low suspicion for stroke or other emergent etiology at this time.  Final Clinical Impression(s) / ED Diagnoses Final diagnoses:  Acute pain of right shoulder    Rx / DC Orders ED Discharge Orders          Ordered    diazepam (VALIUM) 5 MG tablet  Every 8 hours PRN        01/18/23 0408  oxyCODONE-acetaminophen (PERCOCET) 5-325 MG tablet  Every 8 hours PRN        01/18/23 0408              Orby Tangen, Barbara Cower, MD 01/18/23 [phone]

## 2023-01-18 NOTE — Discharge Instructions (Addendum)
Please don't take the pain medication and muscle relaxer within an hour of each other and only take it as prescribed and only if absolutely needed. Don't drive within 4 hours of it taking effect as it can make you dizzy, light headed and slow your decision making.   Also remember that using the sling for too long can cause 'frozen shoulder' where the cartilage in your shoulder starts to get stuck and can be painful to move and eventually lead to reduced range of motion if you don't mobilize the shoulder enough outside of the sling. This can start happening within a few days so please try to move your arm around (even if it needs to be done with other arm) multiple times throughout the day to avoid this. Also consider physical therapy at your next ortho appointment.

## 2023-01-20 ENCOUNTER — Encounter (HOSPITAL_BASED_OUTPATIENT_CLINIC_OR_DEPARTMENT_OTHER): Payer: Self-pay | Admitting: Emergency Medicine

## 2023-01-20 ENCOUNTER — Emergency Department (HOSPITAL_BASED_OUTPATIENT_CLINIC_OR_DEPARTMENT_OTHER)
Admission: EM | Admit: 2023-01-20 | Discharge: 2023-01-20 | Disposition: A | Discharge status: Home / Self Care | Payer: No Typology Code available for payment source | Attending: Emergency Medicine | Admitting: Emergency Medicine

## 2023-01-20 ENCOUNTER — Other Ambulatory Visit: Payer: Self-pay

## 2023-01-20 DIAGNOSIS — M25511 Pain in right shoulder: Secondary | ICD-10-CM | POA: Diagnosis not present

## 2023-01-20 DIAGNOSIS — Z21 Asymptomatic human immunodeficiency virus [HIV] infection status: Secondary | ICD-10-CM | POA: Insufficient documentation

## 2023-01-20 DIAGNOSIS — Z760 Encounter for issue of repeat prescription: Secondary | ICD-10-CM | POA: Insufficient documentation

## 2023-01-20 DIAGNOSIS — Z7982 Long term (current) use of aspirin: Secondary | ICD-10-CM | POA: Diagnosis not present

## 2023-01-20 MED ORDER — DIAZEPAM 5 MG PO TABS
5.0000 mg | ORAL_TABLET | Freq: Once | ORAL | Status: AC
  Administered 2023-01-20: 5 mg via ORAL

## 2023-01-20 MED ORDER — TIZANIDINE HCL 4 MG PO TABS: 4.0000 mg | ORAL_TABLET | Freq: Four times a day (QID) | ORAL | 0 refills | Status: AC | PRN

## 2023-01-20 NOTE — ED Notes (Signed)
Pt informed that he is unable to obtain a ride until later today when family get off work around 1700... Chiropractor were notified.Marland KitchenMarland Kitchen

## 2023-01-20 NOTE — Discharge Instructions (Addendum)
As discussed, keep upcoming appointment with orthopedics for further evaluation/management. Take tylenol for pain and muscle relaxer to use as needed. Muscle relaxer can cause drowsiness so please do not drive while taking medication. Please do not hesitate to return to the ED if the worrisome signs and symptoms we discussed become apparent.

## 2023-01-20 NOTE — ED Notes (Signed)
Discharge paperwork given and verbally understood.... Pt was alert and oriented when leaving... Pt was able to ambulate without assistance... Pt aware of not driving/operating heavy equipment/drinking due to the meds that were given... Pt also having trouble finding his car keys... Attempted to locate with the Pt but at this time they were not found.Marland KitchenMarland Kitchen

## 2023-01-20 NOTE — ED Triage Notes (Signed)
Pt arrives to ED with c/o on-going right shoulder pain. Was seen for same on 7/16. Had MRI completed yesterday.

## 2023-01-20 NOTE — ED Notes (Signed)
Pt given water, and informed to start waking up.

## 2023-01-20 NOTE — ED Notes (Signed)
Pts Birthday and last name confirmed before giving Valium... Pt also confirmed he had the ability to obtain a ride home and understood no drinking/driving/operating heavy equipment.Jacob KitchenMarland Holder

## 2023-01-20 NOTE — ED Provider Notes (Signed)
[place] EMERGENCY DEPARTMENT AT [facility] Provider Note   CSN: [id number] Arrival date & time: 01/20/23  6967     History  Chief Complaint  Patient presents with   Shoulder Pain    Jacob Holder is a 32 y.o. male.   Shoulder Pain   32 year old male presents to the ED with complaints of right shoulder pain. Patient states that symptoms have been going on for several weeks now. Has been seen in the ED twice, urgent care and orthopedics multiple times in the outpatient setting. States he has been told he either has rotator cuff injury or frozen shoulder. States he ran out of his percocet and valium and requesting refill. Had MRI today but has not heard results. No new injury reported. Denies fever, weakness/sensory deficits in affected extremity, cp, shob.  PMHx significant for HIV, brain aneurysm, CVA, normochromic normocytic anemia, cocaine use, depression  Home Medications Prior to Admission medications   Medication Sig Start Date End Date Taking? Authorizing Provider  tiZANidine (ZANAFLEX) 4 MG tablet Take 1 tablet (4 mg total) by mouth every 6 (six) hours as needed for muscle spasms. 01/20/23  Yes Sherian Maroon A, PA  aspirin 81 MG chewable tablet Chew 81 mg by mouth daily.    [provider]  atorvastatin (LIPITOR) 40 MG tablet Take 1 tablet (40 mg total) by mouth daily at 6 PM. 07/04/19   Black, Lesle Chris, NP  bictegravir-emtricitabine-tenofovir AF (BIKTARVY) 50-200-25 MG TABS tablet Take 1 tablet by mouth daily. 07/13/21   Blanchard Kelch, NP  clopidogrel (PLAVIX) 75 MG tablet Take 75 mg by mouth daily.    [provider]  diazepam (VALIUM) 5 MG tablet Take 1 tablet (5 mg total) by mouth every 8 (eight) hours as needed for muscle spasms. 01/18/23   Mesner, Barbara Cower, MD  escitalopram (LEXAPRO) 10 MG tablet Take 1 tablet (10 mg total) by mouth daily. 07/13/21   Blanchard Kelch, NP  oxyCODONE-acetaminophen (PERCOCET) 5-325 MG tablet Take 1-2  tablets by mouth every 8 (eight) hours as needed. 01/18/23   Mesner, Barbara Cower, MD  traZODone (DESYREL) 50 MG tablet Take 1-2 tablets (50-100 mg total) by mouth at bedtime. 07/13/21   Blanchard Kelch, NP      Allergies    Patient has no known allergies.    Review of Systems   Review of Systems  All other systems reviewed and are negative.   Physical Exam Updated Vital Signs BP 128/87 (BP Location: Left Arm)   Pulse (!) 111   Temp 98.7 F (37.1 C) (Oral)   Resp 18   SpO2 94%  Physical Exam Vitals and nursing note reviewed.  Constitutional:      General: He is not in acute distress.    Appearance: He is well-developed.  HENT:     Head: Normocephalic and atraumatic.  Eyes:     Conjunctiva/sclera: Conjunctivae normal.  Cardiovascular:     Rate and Rhythm: Normal rate and regular rhythm.     Heart sounds: No murmur heard. Pulmonary:     Effort: Pulmonary effort is normal. No respiratory distress.     Breath sounds: Normal breath sounds.  Abdominal:     Palpations: Abdomen is soft.     Tenderness: There is no abdominal tenderness.  Musculoskeletal:        General: No swelling.     Cervical back: Neck supple.     Comments: Patient with guarding of the left shoulder. Limited range of motion  secondary to pain. Radial pulses 2+ bilaterally. No overlying erythema, palpable fluctuance, induration. TTP trapezial ridge. Muscular strength 5/5 grip, elbow flexion/extension.   Skin:    General: Skin is warm and dry.     Capillary Refill: Capillary refill takes less than 2 seconds.  Neurological:     Mental Status: He is alert.  Psychiatric:        Mood and Affect: Mood normal.     ED Results / Procedures / Treatments   Labs (all labs ordered are listed, but only abnormal results are displayed) Labs Reviewed - No data to display  EKG None  Radiology No results found.  Procedures Procedures    Medications Ordered in ED Medications  diazepam (VALIUM) tablet 5 mg (5 mg  Oral Given 01/20/23 0942)    ED Course/ Medical Decision Making/ A&P Clinical Course as of 01/20/23 1030  Fri Jan 20, 2023  7846 New years day  [CR]    Clinical Course User Index [CR] Peter Garter, PA                             Medical Decision Making Risk Prescription drug management.   This patient presents to the ED for concern of shoulder pain, this involves an extensive number of treatment options, and is a complaint that carries with it a high risk of complications and morbidity.  The differential diagnosis includes fracture, strain/sprain, dislocation, frozen shoulder, adhesive capsulitis, septic arthritis, cellulitis   Co morbidities that complicate the patient evaluation  See HPI   Additional history obtained:  Additional history obtained from EMR External records from outside source obtained and reviewed including hospital records   Lab Tests:  N/a   Imaging Studies ordered:  N/a   Cardiac Monitoring: / EKG:  The patient was maintained on a cardiac monitor.  I personally viewed and interpreted the cardiac monitored which showed an underlying rhythm of: sinus rhythm   Consultations Obtained:  N/a   Problem List / ED Course / Critical interventions / Medication management  Shoulder pain I ordered medication including valium   Reevaluation of the patient after these medicines showed that the patient improved I have reviewed the patients home medicines and have made adjustments as needed   Social Determinants of Health:  Hx cocaine abuse   Test / Admission - Considered:  Shoulder pain Vitals signs significant for initial tachycardia with HR 111 which decreased with time elapsed and medicines administered while in ED. Otherwise within normal range and stable throughout visit. 32 year old male presents to the ED with complaints of continued right shoulder pain without any new known injury. Patient with symptoms most concerning for  adhesive capsulitis given pain with passive range of motion and prior history of shoulder pain/injury. No indication for secondary infectious process. No pulse deficits concerning for vascular injury/ischemic limb. No bony ttp or hx of recent injury concerning for fracture/dislocation; CT imaging of right shoulder on 7/17 was negative. Patient has 20 of percocet filled two days ago along with 10 of 5mg  valium so will refrain from narcotic prescription at this time. Discussion was had with patient regarding PT referral but patient declined. Patient stated that he already had sling at home to help temporarily "off-load" affected shoulder as well as orthopedic follow [facility]. Will recommend continued use of tylenol as well as muscle relaxer prn. Treatment plan discussed at length with patient, and he acknowledged understanding of  said plan. Patient overall well appearing, afebrile, in no acute distress.  Worrisome signs and symptoms were discussed with the patient, and the patient acknowledged understanding to return to the ED if noticed. Patient was stable upon discharge.          Final Clinical Impression(s) / ED Diagnoses Final diagnoses:  Right shoulder pain, unspecified chronicity    Rx / DC Orders ED Discharge Orders          Ordered    tiZANidine (ZANAFLEX) 4 MG tablet  Every 6 hours PRN        01/20/23 0934              Peter Garter, PA 01/20/23 1030    Blane Ohara, MD 01/20/23 2256

## 2023-07-31 ENCOUNTER — Ambulatory Visit
Admission: EM | Admit: 2023-07-31 | Discharge: 2023-07-31 | Disposition: A | Discharge status: Home / Self Care | Payer: No Typology Code available for payment source | Attending: Internal Medicine | Admitting: Internal Medicine

## 2023-07-31 ENCOUNTER — Encounter: Payer: Self-pay | Admitting: Emergency Medicine

## 2023-07-31 DIAGNOSIS — D849 Immunodeficiency, unspecified: Secondary | ICD-10-CM

## 2023-07-31 DIAGNOSIS — K047 Periapical abscess without sinus: Secondary | ICD-10-CM | POA: Diagnosis not present

## 2023-07-31 DIAGNOSIS — K0500 Acute gingivitis, plaque induced: Secondary | ICD-10-CM | POA: Diagnosis not present

## 2023-07-31 MED ORDER — AMOXICILLIN-POT CLAVULANATE 875-125 MG PO TABS: 1.0000 | ORAL_TABLET | Freq: Two times a day (BID) | ORAL | 0 refills | Status: AC

## 2023-07-31 MED ORDER — AMOXICILLIN-POT CLAVULANATE 875-125 MG PO TABS: 1.0000 | ORAL_TABLET | Freq: Two times a day (BID) | ORAL | 0 refills | Status: DC

## 2023-07-31 NOTE — Discharge Instructions (Signed)
Your dental pain is likely due to dental infection. Take  antibiotic as prescribed for the next 7 days to treat your dental infection. Continue use of ibuprofen as needed with food for dental inflammation and pain.   You may also use tylenol as needed for pain. Perform salt water gargles every 3-4 hours.  Call your insurance company to find out dentists that are covered in our area by your insurance and make an appointment with a dentist for follow-up.   If you develop any new or worsening symptoms or if your symptoms do not start to improve, pleases return here or follow-up with your primary care provider. If your symptoms are severe, please go to the emergency room.

## 2023-07-31 NOTE — ED Triage Notes (Signed)
Pt c/o mouth pain for 1 week. States it started just on left side and has spread all over.

## 2023-07-31 NOTE — ED Provider Notes (Signed)
[facility]    CSN: [id number] Arrival date & time: 07/31/23  9528      History   Chief Complaint Chief Complaint  Patient presents with   Dental Pain    HPI Jacob Holder is a 33 y.o. male.   Jacob Holder is a 33 y.o. male with history of HIV, cocaine abuse, stroke, and aneurysm presenting for chief complaint of Dental Pain that started approximately 7 days ago.  Reports pain and swelling to the upper and lower gingiva. Denies recent dental work, fevers, chills, nausea, vomiting, abdominal pain, rash, or recent antibiotic/steroid use. He recently got insurance and plans on establishing care with a dentist soon but has not seen a dentist in years. Denies difficulty opening and closing jaw, nasal congestion, viral URI symptoms, sore throat, dizziness, and ear pain. Taking tylenol without much relief.    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/26/2019   Essential hypertension     Past Surgical History:  Procedure Laterality Date   IR ANGIO INTRA EXTRACRAN SEL COM CAROTID INNOMINATE BILAT MOD SED  03/05/2019   IR ANGIO VERTEBRAL SEL VERTEBRAL BILAT MOD SED  03/05/2019   IR ANGIO VERTEBRAL SEL VERTEBRAL UNI R MOD SED  06/26/2019   IR ANGIOGRAM FOLLOW UP STUDY  06/26/2019   IR NEURO EACH ADD'L AFTER BASIC UNI RIGHT (MS)  06/26/2019   IR TRANSCATH/EMBOLIZ  06/26/2019    RADIOLOGY WITH ANESTHESIA N/A 06/26/2019   Procedure: EMBOLIZATION;  Surgeon: Julieanne Cotton, MD;  Location: [facility] OR;  Service: Radiology;  Laterality: N/A;   RECTAL SURGERY     TOOTH EXTRACTION         Home Medications    Prior to Admission medications   Medication Sig Start Date End Date Taking? Authorizing Provider  amoxicillin-clavulanate (AUGMENTIN) 875-125 MG tablet Take 1 tablet by mouth every 12 (twelve) hours. 07/31/23   Carlisle Beers, FNP  aspirin 81 MG chewable tablet Chew 81 mg by mouth daily. Patient not taking: Reported on 07/31/2023    [provider]  atorvastatin (LIPITOR) 40 MG tablet Take 1 tablet (40 mg total) by mouth daily at 6 PM. Patient not taking: Reported on 07/31/2023 07/04/19   Gwenyth Bender, NP  bictegravir-emtricitabine-tenofovir AF (BIKTARVY) 50-200-25 MG TABS tablet Take 1 tablet by mouth daily. Patient not taking: Reported on 07/31/2023 07/13/21   Blanchard Kelch, NP  clopidogrel (PLAVIX) 75 MG tablet Take 75 mg by mouth daily. Patient not taking: Reported on 07/31/2023    [provider]  diazepam (VALIUM) 5 MG tablet Take 1 tablet (5 mg total) by mouth every 8 (eight) hours as needed for muscle spasms. Patient not taking: Reported on 07/31/2023 01/18/23   Mesner, Barbara Cower, MD  escitalopram (LEXAPRO) 10 MG tablet Take 1 tablet (10 mg total) by mouth  daily. Patient not taking: Reported on 07/31/2023 07/13/21   Blanchard Kelch, NP  oxyCODONE-acetaminophen (PERCOCET) 5-325 MG tablet Take 1-2 tablets by mouth every 8 (eight) hours as needed. Patient not taking: Reported on 07/31/2023 01/18/23   Mesner, Barbara Cower, MD  tiZANidine (ZANAFLEX) 4 MG tablet Take 1 tablet (4 mg total) by mouth every 6 (six) hours as needed for muscle spasms. Patient not taking: Reported on 07/31/2023 01/20/23   Sherian Maroon A, PA  traZODone (DESYREL) 50 MG tablet Take 1-2 tablets (50-100 mg total) by mouth at bedtime. Patient not taking: Reported on 07/31/2023  07/13/21   Blanchard Kelch, NP    Family History Family History  Problem Relation Age of Onset   Healthy Mother    Thyroid disease Mother    Diabetes Father     Social History Social History   Tobacco Use   Smoking status: Every Day    Current packs/day: 0.50    Types: Cigarettes   Smokeless tobacco: Never  Vaping Use   Vaping status: Never Used  Substance Use Topics   Alcohol use: Yes    Comment: from time to time   Drug use: No     Allergies   Patient has no known allergies.   Review of Systems Review of Systems Per HPI  Physical Exam Triage Vital Signs ED Triage Vitals [07/31/23 0833]  Encounter Vitals Group     BP 116/75     Systolic BP Percentile      Diastolic BP Percentile      Pulse Rate 88     Resp 18     Temp 98.6 F (37 C)     Temp Source Oral     SpO2 97 %     Weight      Height      Head Circumference      Peak Flow      Pain Score 10     Pain Loc      Pain Education      Exclude from Growth Chart    No data found.  Updated Vital Signs BP 116/75 (BP Location: Right Arm)   Pulse 88   Temp 98.6 F (37 C) (Oral)   Resp 18   SpO2 97%   Visual Acuity Right Eye Distance:   Left Eye Distance:   Bilateral Distance:    Right Eye Near:   Left Eye Near:    Bilateral Near:     Physical Exam Vitals and nursing note reviewed.  Constitutional:      Appearance: He is not ill-appearing or toxic-appearing.  HENT:     Head: Normocephalic and atraumatic.     Right Ear: Hearing, tympanic membrane, ear canal and external ear normal.     Left Ear: Hearing, tympanic membrane, ear canal and external ear normal.     Nose: Nose normal.     Mouth/Throat:     Lips: Pink.     Mouth: Mucous membranes are moist. No injury or oral lesions.     Dentition: Abnormal dentition. Dental tenderness, gingival swelling and dental caries present.     Tongue: No lesions.     Pharynx: Oropharynx is clear. Uvula midline. No pharyngeal swelling,  oropharyngeal exudate, posterior oropharyngeal erythema, uvula swelling or postnasal drip.     Tonsils: No tonsillar exudate.     Comments: No trismus, phonation normal, maintaining secretions without difficulty.  Multiple dental caries to the upper and lower mouth with gingival swelling, erythema, and tenderness.  Eyes:     General: Lids are normal. Vision grossly intact. Gaze aligned appropriately.     Extraocular Movements: Extraocular movements intact.     Conjunctiva/sclera: Conjunctivae normal.  Neck:     Trachea: Trachea and phonation normal.  Pulmonary:     Effort: Pulmonary effort is normal.  Musculoskeletal:     Cervical back: Neck supple.  Lymphadenopathy:     Cervical: No cervical adenopathy.  Skin:    General: Skin is warm and dry.     Capillary Refill: Capillary refill takes less than 2 seconds.     Findings: No rash.  Neurological:     General: No focal deficit present.     Mental Status: He is alert and oriented to person, place, and time. Mental status is at baseline.     Cranial Nerves: No dysarthria or facial asymmetry.  Psychiatric:        Mood and Affect: Mood normal.        Speech: Speech normal.        Behavior: Behavior normal.        Thought Content: Thought content normal.        Judgment: Judgment normal.      UC Treatments / Results  Labs (all labs ordered are listed, but only abnormal results are displayed) Labs Reviewed - No data to display  EKG   Radiology No results found.  Procedures Procedures (including critical care time)  Medications Ordered in UC Medications - No data to display  Initial Impression / Assessment and Plan / UC Course  I have reviewed the triage vital signs and the nursing notes.  Pertinent labs & imaging results that were available during my care of the patient were reviewed by me and considered in my medical decision making (see chart for details).   1.  Acute gingivitis, dental infection Presentation  suspicious for acute gingivitis. Patient is immunocompromised with HIV and high risk for infection. Will treat with Augmentin twice daily for 7 days. May take Tylenol as needed for pain and swelling. A salt water gargles recommended. He just got dental insurance and has been advised to call his insurance company to establish care with a dentist in the area that takes his insurance for follow-up and routine cleanings. No signs of dental emergencies on exam, however ER return precautions have been discussed.  Counseled patient on potential for adverse effects with medications prescribed/recommended today, strict ER and return-to-clinic precautions discussed, patient verbalized understanding.   Final Clinical Impressions(s) / UC Diagnoses   Final diagnoses:  Gingivitis, acute  Dental infection     Discharge Instructions      Your dental pain is likely due to dental infection. Take  antibiotic as prescribed for the next 7 days to treat your dental infection. Continue use of ibuprofen as needed with food for dental inflammation and pain.   You may also use tylenol as needed for pain. Perform salt water gargles every 3-4 hours.  Call your insurance company to find out dentists that are covered in our area by your insurance and make an appointment with a dentist for follow-up.   If you develop any new or worsening symptoms or if your symptoms do not start to improve, pleases return here or follow-up with your primary care provider. If your symptoms are severe, please go to the emergency room.    ED Prescriptions     Medication Sig Dispense Auth. Provider   amoxicillin-clavulanate (AUGMENTIN) 875-125 MG tablet  (Status: Discontinued) Take  1 tablet by mouth every 12 (twelve) hours. 14 tablet Carlisle Beers, FNP   amoxicillin-clavulanate (AUGMENTIN) 875-125 MG tablet Take 1 tablet by mouth every 12 (twelve) hours. 14 tablet Carlisle Beers, FNP      PDMP not reviewed this  encounter.   Reita May [place], [place] 07/31/23 [phone]

## 2023-08-29 ENCOUNTER — Telehealth: Payer: Self-pay

## 2023-08-29 NOTE — Telephone Encounter (Signed)
 Patient considered out of care.  Last [facility] Visit: 07/13/21  Last HIV Viral Load:  HIV 1 RNA Quant  Date Value Ref Range Status  07/13/2021 86 (H) Copies/mL Final    Last CD4 Count:  CD4 T Cell Abs  Date Value Ref Range Status  10/08/2020 789 400 - 1,790 /uL Final    Medication Dispense History:    Dispensed Days Supply Quantity Provider Pharmacy  BIKTARVY 50/200/25MG  TABLETS 07/14/2021 30 30 each Jennette Kettle, RPH-CPP [facility]  BIKTARVY 50/200/25MG  TABLETS 06/18/2021 30 30 each Jennette Kettle, RPH-CPP [facility]  BIKTARVY 50/200/25MG  TABLETS 05/07/2021 30 30 each Jennette Kettle, RPH-CPP [facility]    Interventions: Called Amy to set up an appointment, no answer. Left HIPAA compliant voicemail requesting callback.   Email sent to patient's case manager with [facility]: [email or web address]    Sandie Ano, RN

## 2023-09-13 ENCOUNTER — Telehealth: Payer: Self-pay

## 2023-09-13 NOTE — Telephone Encounter (Signed)
Called patient to schedule appointment, no answer. Left HIPAA compliant voicemail requesting callback.   Kohei Antonellis D Himmat Enberg, RN  

## 2023-09-13 NOTE — Telephone Encounter (Signed)
 Received response email from patient's [facility] case manager, Hedy Jacob:   I called [facility] this afternoon however, I was unsuccessful getting in touch with him. I will follow up again and encourage him to schedule appointment at the clinic as well as "show up" for his scheduled appointment. He just bought a car, so I am sure that transportation is no longer a barrier.

## 2023-09-14 NOTE — Telephone Encounter (Signed)
 Notified by front desk that patient called to schedule appointment, but only has one week of medication left. Patient has not been seen in over two years and would have run out of Biktarvy over one year ago based on last prescription.   Front desk to advise patient that appointment is needed for refills. Also recommended moving appointment up with pharmacy team.   Sandie Ano, RN

## 2023-09-18 NOTE — Progress Notes (Deleted)
 HPI: Jacob Holder is a 33 y.o. male who presents to the [facility] pharmacy clinic for HIV follow-up.  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   ASPIRIN 81 MG CHEWABLE TABLET    Chew 81 mg by mouth daily.   ATORVASTATIN (LIPITOR) 40 MG TABLET    Take 1 tablet (40 mg total) by mouth daily at 6 PM.   BICTEGRAVIR-EMTRICITABINE-TENOFOVIR AF (BIKTARVY) 50-200-25 MG TABS TABLET    Take 1 tablet by mouth daily.   CLOPIDOGREL (PLAVIX) 75 MG TABLET    Take 75 mg by mouth daily.   DIAZEPAM (VALIUM) 5 MG TABLET    Take 1 tablet (5 mg total) by mouth every 8 (eight) hours as needed for muscle spasms.   ESCITALOPRAM (LEXAPRO) 10 MG TABLET    Take 1 tablet (10 mg total) by mouth daily.   OXYCODONE-ACETAMINOPHEN (PERCOCET) 5-325 MG TABLET    Take 1-2 tablets by mouth every 8 (eight) hours as needed.   TIZANIDINE (ZANAFLEX) 4 MG TABLET    Take 1 tablet (4 mg total) by mouth every 6 (six) hours as needed for muscle spasms.   TRAZODONE (DESYREL) 50 MG TABLET    Take 1-2 tablets (50-100 mg total) by mouth at bedtime.  Modified Medications   No medications on file  Discontinued Medications   No medications on file    Labs: Lab Results  Component Value Date   HIV1RNAQUANT 86 (H) 07/13/2021   HIV1RNAQUANT 23 (H) 04/29/2021   HIV1RNAQUANT 42 (H) 02/17/2021   CD4TABS 789 10/08/2020   CD4TABS 687  05/25/2020   CD4TABS 574 11/06/2019    RPR and STI Lab Results  Component Value Date   LABRPR NON-REACTIVE 10/08/2020   LABRPR REACTIVE (A) 07/31/2019   RPRTITER 1:1 (H) 07/31/2019    STI Results GC CT  12/30/2021 10:37 AM Negative  Negative   07/31/2019  9:22 AM Negative  Negative     Hepatitis B Lab Results  Component Value Date   HEPBSAB NON-REACTIVE 07/31/2019   HEPBSAG NON-REACTIVE 07/31/2019   HEPBCAB NON-REACTIVE 07/31/2019   Hepatitis C Lab Results  Component Value Date   HEPCAB NON-REACTIVE 07/31/2019   Hepatitis A Lab Results  Component Value Date   HAV NON-REACTIVE 07/31/2019   Lipids: Lab Results  Component Value Date   CHOL 90 07/31/2019   TRIG 61 07/31/2019   HDL 29 (L) 07/31/2019   CHOLHDL 3.1 07/31/2019   VLDL 36 06/28/2019   LDLCALC 47 07/31/2019    Current HIV Regimen: ***  Assessment: Jacob Holder comes in today for HIV follow up. He has missed multiple appointments and last saw Jacob Holder on 07/13/21. Last HIV RNA was 86 at that time; CD4 count was 789 when checked in 2022. He called our front desk staff last week and stated that he was about to run out of his Biktarvy. As he has not been seen  in over 2 years, he should have ran out of [place] over a year ago. Fill records show very inconsistent history - 09/30/20, 01/18/21, 02/14/21, 05/07/21, 06/18/21, and 07/14/21.   Plan: ***  Jacob Cumpian L. Onia Shiflett, PharmD, BCIDP, AAHIVP, CPP Clinical Pharmacist Practitioner Infectious Diseases Clinical Pharmacist [facility] 09/18/2023, 3:50 PM

## 2023-09-19 ENCOUNTER — Ambulatory Visit: Admitting: Pharmacist

## 2023-09-19 DIAGNOSIS — B20 Human immunodeficiency virus [HIV] disease: Secondary | ICD-10-CM

## 2023-09-19 DIAGNOSIS — Z79899 Other long term (current) drug therapy: Secondary | ICD-10-CM

## 2023-09-19 DIAGNOSIS — Z113 Encounter for screening for infections with a predominantly sexual mode of transmission: Secondary | ICD-10-CM

## 2023-09-27 ENCOUNTER — Other Ambulatory Visit (HOSPITAL_COMMUNITY): Payer: Self-pay

## 2023-09-27 ENCOUNTER — Telehealth: Payer: Self-pay

## 2023-09-27 NOTE — Telephone Encounter (Signed)
 Pharmacy Patient Advocate Encounter  Insurance verification completed.   The patient is insured through [employer] claim for [employer]. Currently a quantity of 30 is a 30 day supply and Pharmacy is not contracted. Patient will need to use [facility].   This test claim was processed through [facility]- copay amounts may vary at other pharmacies due to pharmacy/plan contracts, or as the patient moves through the different stages of their insurance plan.

## 2023-09-27 NOTE — Progress Notes (Deleted)
 HPI: Jacob Holder is a 33 y.o. male who presents to the [facility] pharmacy clinic for HIV follow-up.  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   ASPIRIN 81 MG CHEWABLE TABLET    Chew 81 mg by mouth daily.   ATORVASTATIN (LIPITOR) 40 MG TABLET    Take 1 tablet (40 mg total) by mouth daily at 6 PM.   BICTEGRAVIR-EMTRICITABINE-TENOFOVIR AF (BIKTARVY) 50-200-25 MG TABS TABLET    Take 1 tablet by mouth daily.   CLOPIDOGREL (PLAVIX) 75 MG TABLET    Take 75 mg by mouth daily.   DIAZEPAM (VALIUM) 5 MG TABLET    Take 1 tablet (5 mg total) by mouth every 8 (eight) hours as needed for muscle spasms.   ESCITALOPRAM (LEXAPRO) 10 MG TABLET    Take 1 tablet (10 mg total) by mouth daily.   OXYCODONE-ACETAMINOPHEN (PERCOCET) 5-325 MG TABLET    Take 1-2 tablets by mouth every 8 (eight) hours as needed.   TIZANIDINE (ZANAFLEX) 4 MG TABLET    Take 1 tablet (4 mg total) by mouth every 6 (six) hours as needed for muscle spasms.   TRAZODONE (DESYREL) 50 MG TABLET    Take 1-2 tablets (50-100 mg total) by mouth at bedtime.  Modified Medications   No medications on file  Discontinued Medications   No medications on file    Labs: Lab Results  Component Value Date   HIV1RNAQUANT 86 (H) 07/13/2021   HIV1RNAQUANT 23 (H) 04/29/2021   HIV1RNAQUANT 42 (H) 02/17/2021   CD4TABS 789 10/08/2020   CD4TABS 687  05/25/2020   CD4TABS 574 11/06/2019    RPR and STI Lab Results  Component Value Date   LABRPR NON-REACTIVE 10/08/2020   LABRPR REACTIVE (A) 07/31/2019   RPRTITER 1:1 (H) 07/31/2019    STI Results GC CT  12/30/2021 10:37 AM Negative  Negative   07/31/2019  9:22 AM Negative  Negative     Hepatitis B Lab Results  Component Value Date   HEPBSAB NON-REACTIVE 07/31/2019   HEPBSAG NON-REACTIVE 07/31/2019   HEPBCAB NON-REACTIVE 07/31/2019   Hepatitis C Lab Results  Component Value Date   HEPCAB NON-REACTIVE 07/31/2019   Hepatitis A Lab Results  Component Value Date   HAV NON-REACTIVE 07/31/2019   Lipids: Lab Results  Component Value Date   CHOL 90 07/31/2019   TRIG 61 07/31/2019   HDL 29 (L) 07/31/2019   CHOLHDL 3.1 07/31/2019   VLDL 36 06/28/2019   LDLCALC 47 07/31/2019    Current HIV Regimen: ***  Assessment: Jacob Holder is here today to follow up for his HIV infection. He has missed several appointments lately and was last seen by [facility] on 07/13/21. His HIV viral load was 86 at that time. His fill history of his Susanne Borders has been terrible (09/30/20, 01/18/21, 02/14/21, 05/07/21, 06/18/21, and 07/14/21). On 09/14/23, he told one of our nurses that he has  one week of medication left. Unsure how this is possible since he should have ran out of refills over a year ago.   Menveo?   Plan: - HIV RNA with genotype, CD4, RPR, urine/oral/rectal cytologies, CBC with diff, CMP, and lipid panel today - Hepatitis A antibody and Hepatitis B surface antibody since vaccines are completed -   Jacob Holder, PharmD, BCIDP, AAHIVP, CPP Clinical Pharmacist Practitioner Infectious Diseases Clinical Pharmacist [facility] 09/27/2023, 1:57 PM

## 2023-10-02 ENCOUNTER — Ambulatory Visit: Admitting: Pharmacist

## 2023-10-02 DIAGNOSIS — Z113 Encounter for screening for infections with a predominantly sexual mode of transmission: Secondary | ICD-10-CM

## 2023-10-02 DIAGNOSIS — Z79899 Other long term (current) drug therapy: Secondary | ICD-10-CM

## 2023-10-02 DIAGNOSIS — B20 Human immunodeficiency virus [HIV] disease: Secondary | ICD-10-CM

## 2023-10-06 ENCOUNTER — Ambulatory Visit: Admitting: Infectious Diseases

## 2023-10-16 NOTE — Progress Notes (Unsigned)
 HPI: Jacob Holder is a 33 y.o. male who presents to the [facility] pharmacy clinic for HIV follow-up.  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   ASPIRIN 81 MG CHEWABLE TABLET    Chew 81 mg by mouth daily.   ATORVASTATIN (LIPITOR) 40 MG TABLET    Take 1 tablet (40 mg total) by mouth daily at 6 PM.   BICTEGRAVIR-EMTRICITABINE-TENOFOVIR AF (BIKTARVY) 50-200-25 MG TABS TABLET    Take 1 tablet by mouth daily.   CLOPIDOGREL (PLAVIX) 75 MG TABLET    Take 75 mg by mouth daily.   DIAZEPAM (VALIUM) 5 MG TABLET    Take 1 tablet (5 mg total) by mouth every 8 (eight) hours as needed for muscle spasms.   ESCITALOPRAM (LEXAPRO) 10 MG TABLET    Take 1 tablet (10 mg total) by mouth daily.   OXYCODONE-ACETAMINOPHEN (PERCOCET) 5-325 MG TABLET    Take 1-2 tablets by mouth every 8 (eight) hours as needed.   TIZANIDINE (ZANAFLEX) 4 MG TABLET    Take 1 tablet (4 mg total) by mouth every 6 (six) hours as needed for muscle spasms.   TRAZODONE (DESYREL) 50 MG TABLET    Take 1-2 tablets (50-100 mg total) by mouth at bedtime.  Modified Medications   No medications on file  Discontinued Medications   No medications on file    Labs: Lab Results  Component Value Date   HIV1RNAQUANT 86 (H) 07/13/2021   HIV1RNAQUANT 23 (H) 04/29/2021   HIV1RNAQUANT 42 (H) 02/17/2021   CD4TABS 789 10/08/2020   CD4TABS 687  05/25/2020   CD4TABS 574 11/06/2019    RPR and STI Lab Results  Component Value Date   LABRPR NON-REACTIVE 10/08/2020   LABRPR REACTIVE (A) 07/31/2019   RPRTITER 1:1 (H) 07/31/2019    STI Results GC CT  12/30/2021 10:37 AM Negative  Negative   07/31/2019  9:22 AM Negative  Negative     Hepatitis B Lab Results  Component Value Date   HEPBSAB NON-REACTIVE 07/31/2019   HEPBSAG NON-REACTIVE 07/31/2019   HEPBCAB NON-REACTIVE 07/31/2019   Hepatitis C Lab Results  Component Value Date   HEPCAB NON-REACTIVE 07/31/2019   Hepatitis A Lab Results  Component Value Date   HAV NON-REACTIVE 07/31/2019   Lipids: Lab Results  Component Value Date   CHOL 90 07/31/2019   TRIG 61 07/31/2019   HDL 29 (L) 07/31/2019   CHOLHDL 3.1 07/31/2019   VLDL 36 06/28/2019   LDLCALC 47 07/31/2019    Current HIV Regimen: Biktarvy   Assessment: Jacob Holder is a 33 yo male presenting for an HIV follow-up appointment. He remains on Biktarvy with no reported side effects  Last HIV RNA was 86 in 07/2021   Immunizations: He is eligible for the tdap, shingles, meningococcal, and HPV vaccinations. Has also received hepatitis B and A vaccinations since last antibodies, will recheck today.   Plan: ***  Cassie L. Kuppelweiser, PharmD, BCIDP, AAHIVP, CPP Clinical Pharmacist Practitioner Infectious Diseases Clinical Pharmacist [facility] 10/16/2023, 7:16 PM

## 2023-10-17 ENCOUNTER — Ambulatory Visit
Admission: EM | Admit: 2023-10-17 | Discharge: 2023-10-17 | Disposition: A | Discharge status: Home / Self Care | Attending: Internal Medicine | Admitting: Internal Medicine

## 2023-10-17 ENCOUNTER — Other Ambulatory Visit: Payer: Self-pay

## 2023-10-17 ENCOUNTER — Ambulatory Visit: Admitting: Pharmacist

## 2023-10-17 DIAGNOSIS — K644 Residual hemorrhoidal skin tags: Secondary | ICD-10-CM | POA: Diagnosis not present

## 2023-10-17 MED ORDER — HYDROCORT-PRAMOXINE (PERIANAL) 1-1 % EX FOAM: 1.0000 | Freq: Two times a day (BID) | CUTANEOUS | 0 refills | Status: DC

## 2023-10-17 MED ORDER — HYDROCORTISONE (PERIANAL) 2.5 % EX CREA
1.0000 | TOPICAL_CREAM | Freq: Two times a day (BID) | CUTANEOUS | 0 refills | Status: AC
Start: 2023-10-17 — End: ?

## 2023-10-17 NOTE — Discharge Instructions (Signed)
 I have prescribed you a topical medication to apply directly to area.  If symptoms persist or worsen, please follow-up.

## 2023-10-17 NOTE — ED Triage Notes (Signed)
 Pt presents with complaints of rectal pain and spasms x 1.5 weeks. Pt states he was not able to have a bowel movement last week and tried to force himself too. Rectal spasms have been constant since 2 AM this morning. Pt currently rates his overall pain a 8/10. Rectal suppositories + Ibuprofen taken with no relief. Denies any bleeding from rectum.

## 2023-10-17 NOTE — ED Provider Notes (Addendum)
 [facility]    CSN: [id number] Arrival date & time: 10/17/23  0809      History   Chief Complaint Chief Complaint  Patient presents with   Rectal Pain    HPI Jacob Holder is a 33 y.o. male.   Patient presents with approximately 1 week history of rectal "spasms" and rectal pain.  Reports that symptoms initially started with constipation, so he "forced himself to have a bowel movement" and has been having increased pain ever since.  Denies blood in stool.  He is now able to have normal bowel movements.  Reports that he has had an anal fissure in the past but this is not completely similar as he has not had any rectal bleeding.  He has used over-the-counter rectal suppositories for hemorrhoids, used sitz bath's, and taken ibuprofen with temporary improvement.  He denies any injury to the area or any insertion of foreign body to his rectum.     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/26/2019   Essential hypertension     Past Surgical History:  Procedure Laterality Date   IR ANGIO INTRA EXTRACRAN SEL COM CAROTID INNOMINATE BILAT MOD SED  03/05/2019   IR ANGIO VERTEBRAL SEL VERTEBRAL BILAT MOD SED  03/05/2019   IR ANGIO VERTEBRAL SEL VERTEBRAL UNI R MOD SED  06/26/2019   IR ANGIOGRAM FOLLOW UP STUDY  06/26/2019   IR NEURO EACH ADD'L AFTER BASIC UNI RIGHT (MS)  06/26/2019   IR TRANSCATH/EMBOLIZ   06/26/2019   RADIOLOGY WITH ANESTHESIA N/A 06/26/2019   Procedure: EMBOLIZATION;  Surgeon: Luellen Sages, MD;  Location: [facility] OR;  Service: Radiology;  Laterality: N/A;   RECTAL SURGERY     TOOTH EXTRACTION         Home Medications    Prior to Admission medications   Medication Sig Start Date End Date Taking? Authorizing Provider  bictegravir-emtricitabine-tenofovir AF (BIKTARVY) 50-200-25 MG TABS tablet Take 1 tablet by mouth daily. 07/13/21  Yes Orson Blalock, NP  hydrocortisone (PROCTOSOL HC) 2.5 % rectal cream Place 1 Application rectally 2 (two) times daily. 10/17/23  Yes Natoria Archibald E, FNP  amoxicillin-clavulanate (AUGMENTIN) 875-125 MG tablet Take 1 tablet by mouth every 12 (twelve) hours. 07/31/23   Starlene Eaton, FNP  aspirin 81 MG chewable tablet Chew 81 mg by mouth daily. Patient not taking: Reported on 07/31/2023    [provider]  atorvastatin (LIPITOR) 40 MG tablet Take 1 tablet (40 mg total) by mouth daily at 6 PM. Patient not taking: Reported on 07/31/2023 07/04/19   Ronni Colace, NP  clopidogrel (PLAVIX) 75 MG tablet Take 75 mg by mouth daily. Patient not taking: Reported on 07/31/2023    [provider]  diazepam (VALIUM) 5 MG tablet Take 1 tablet (5 mg total) by mouth every 8 (eight) hours as  needed for muscle spasms. Patient not taking: Reported on 07/31/2023 01/18/23   Mesner, Jason, MD  escitalopram (LEXAPRO) 10 MG tablet Take 1 tablet (10 mg total) by mouth daily. Patient not taking: Reported on 07/31/2023 07/13/21   Orson Blalock, NP  oxyCODONE-acetaminophen (PERCOCET) 5-325 MG tablet Take 1-2 tablets by mouth every 8 (eight) hours as needed. Patient not taking: Reported on 07/31/2023 01/18/23   Mesner, Reymundo Caulk, MD  tiZANidine (ZANAFLEX) 4 MG tablet Take 1 tablet (4 mg total) by mouth every 6 (six) hours as needed for muscle spasms. Patient not taking: Reported on 07/31/2023 01/20/23   Neil Balls A, PA  traZODone (DESYREL) 50 MG  tablet Take 1-2 tablets (50-100 mg total) by mouth at bedtime. Patient not taking: Reported on 07/31/2023 07/13/21   Orson Blalock, NP    Family History Family History  Problem Relation Age of Onset   Healthy Mother    Thyroid disease Mother    Diabetes Father     Social History Social History   Tobacco Use   Smoking status: Every Day    Current packs/day: 0.50    Types: Cigarettes   Smokeless tobacco: Never  Vaping Use   Vaping status: Never Used  Substance Use Topics   Alcohol use: Yes    Comment: from time to time   Drug use: No     Allergies   Patient has no known allergies.   Review of Systems Review of Systems Per HPI  Physical Exam Triage Vital Signs ED Triage Vitals  Encounter Vitals Group     BP 10/17/23 0814 120/81     Systolic BP Percentile --      Diastolic BP Percentile --      Pulse Rate 10/17/23 0814 93     Resp 10/17/23 0814 17     Temp 10/17/23 0814 98 F (36.7 C)     Temp Source 10/17/23 0814 Oral     SpO2 10/17/23 0814 97 %     Weight 10/17/23 0814 190 lb (86.2 kg)     Height 10/17/23 0814 5\' 11"  (1.803 m)     Head Circumference --      Peak Flow --      Pain Score 10/17/23 0831 8     Pain Loc --      Pain Education --      Exclude from Growth Chart --    No data found.  Updated Vital Signs BP 120/81 (BP Location: Right Arm)   Pulse 93   Temp 98 F (36.7 C) (Oral)   Resp 17   Ht 5\' 11"  (1.803 m)   Wt 190 lb (86.2 kg)   SpO2 97%   BMI 26.50 kg/m   Visual Acuity Right Eye Distance:   Left Eye Distance:   Bilateral Distance:    Right Eye Near:   Left Eye Near:    Bilateral Near:     Physical Exam Exam conducted with a chaperone present.  Constitutional:      General: He is not in acute distress.    Appearance: Normal appearance. He is not toxic-appearing or diaphoretic.  HENT:     Head: Normocephalic and atraumatic.  Eyes:     Extraocular Movements: Extraocular movements intact.     Conjunctiva/sclera:  Conjunctivae normal.  Pulmonary:     Effort: Pulmonary effort is normal.  Genitourinary:    Rectum: External hemorrhoid present.       Comments: There appears to be a possible external hemorrhoid  that is not prolapsed present to left side of rectum.  I do not note any obvious anal fissures.  No other swelling noted. Neurological:     General: No focal deficit present.     Mental Status: He is alert and oriented to person, place, and time. Mental status is at baseline.  Psychiatric:        Mood and Affect: Mood normal.        Behavior: Behavior normal.        Thought Content: Thought content normal.        Judgment: Judgment normal.      UC Treatments / Results  Labs (all labs ordered are listed, but only abnormal results are displayed) Labs Reviewed - No data to display  EKG   Radiology No results found.  Procedures Procedures (including critical care time)  Medications Ordered in UC Medications - No data to display  Initial Impression / Assessment and Plan / UC Course  I have reviewed the triage vital signs and the nursing notes.  Pertinent labs & imaging results that were available during my care of the patient were reviewed by me and considered in my medical decision making (see chart for details).     Physical exam is consistent with external hemorrhoid.  There are no obvious anal fissures, abscess, or other abnormalities noted to rectal examination.  Attempted to prescribe Proctofoam but this is not covered by patient's insurance and he is not able to pick this up due to financial reasons.  Proctofoam discontinued at pharmacy. Therefore, will prescribe Proctosol to see if this will be helpful. Advised to continue sitz baths. Advised strict follow-up precautions if any symptoms persist or worsen.  Patient verbalized understanding and was agreeable with plan. Final Clinical Impressions(s) / UC Diagnoses   Final diagnoses:  External hemorrhoid     Discharge  Instructions      I have prescribed you a topical medication to apply directly to area.  If symptoms persist or worsen, please follow-up.    ED Prescriptions     Medication Sig Dispense Auth. Provider   hydrocortisone-pramoxine [facility]) rectal foam  (Status: Discontinued) Place 1 applicator rectally 2 (two) times daily. 10 g Aquiles Ruffini E, FNP   hydrocortisone (PROCTOSOL HC) 2.5 % rectal cream Place 1 Application rectally 2 (two) times daily. 30 g Dodson [place], [place]      PDMP not reviewed this encounter.   Dodson Fre[place], [place] 10/17/23 1041    [address] [place] 10/17/23 1041

## 2023-11-09 ENCOUNTER — Telehealth: Payer: Self-pay

## 2023-11-09 NOTE — Telephone Encounter (Signed)
 Called Jacob Holder to schedule appointment, no answer. Left HIPAA compliant voicemail requesting callback.   Shakinah Navis, BSN, RN

## 2023-12-06 ENCOUNTER — Telehealth: Payer: Self-pay

## 2023-12-06 NOTE — Telephone Encounter (Signed)
 Called H[facility]in to schedule appointment, no answer. Left HIPAA compliant voicemail requesting callback.   Letter mailed to address on file.   Saddie Sandeen, BSN, RN

## 2023-12-29 ENCOUNTER — Encounter (HOSPITAL_COMMUNITY): Payer: Self-pay | Admitting: Interventional Radiology

## 2024-01-10 NOTE — Progress Notes (Signed)
 [facility] Counselor Referral Notice  Attempts to reach patient to re-engage in ID clinic care have not been successful. Patient is being referred to [facility] Counselor for assistance in re-engaging in care.   Patient was enrolled with [facility] (case manager: Wilford Canton).   Last HIV Viral Load:  HIV 1 RNA Quant  Date Value Ref Range Status  07/13/2021 86 (H) Copies/mL Final    Last CD4 Count:  CD4 T Cell Abs  Date Value Ref Range Status  10/08/2020 789 400 - 1,790 /uL Final     Last [facility] Visit: 07/13/21  Medication Dispense History:   Dispensed Days Supply Quantity Provider Pharmacy   BIKTARVY  50/200/25MG  TABLETS 07/14/2021 30 30 each Waddell Alan PARAS, RPH-CPP [facility]  BIKTARVY  50/200/25MG  TABLETS 06/18/2021 30 30 each Waddell Alan PARAS, RPH-CPP [facility]  BIKTARVY  50/200/25MG  TABLETS 05/07/2021 30 30 each Waddell Alan PARAS, RPH-CPP [facility]     Phone Call Attempts:  Attempt Date  08/29/23  09/13/23  11/09/23  12/06/23    MyChart Attempts:  Attempt Date  04/29/22  08/29/23  11/09/23    Letter Mailed: 12/06/23   Duration of Service: 10 minutes  Duwaine Lowe, BSN, RN

## 2024-01-17 ENCOUNTER — Telehealth: Payer: Self-pay

## 2024-01-17 NOTE — Telephone Encounter (Signed)
 Called pt about returning to care mailbox is full.

## 2024-02-27 ENCOUNTER — Telehealth: Payer: Self-pay

## 2024-02-27 NOTE — Telephone Encounter (Signed)
 Called Ary to offer an appointment, no answer and voicemail full.  Teniqua Marron, BSN, RN

## 2024-05-21 ENCOUNTER — Telehealth: Payer: Self-pay

## 2024-05-21 NOTE — Telephone Encounter (Signed)
 Called Phillip to schedule appointment, no answer and voicemail full.   Shahad Mazurek, BSN, RN

## 2024-07-19 ENCOUNTER — Telehealth: Payer: Self-pay

## 2024-07-19 NOTE — Telephone Encounter (Signed)
 Called patient to schedule appointment, number is not in service.   Jacob Holder, BSN, RN

## 2024-07-23 NOTE — Progress Notes (Deleted)
 "  HPI: Jacob Holder is a 34 y.o. male who presents to the [facility] pharmacy clinic for HIV follow-up.  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   ASPIRIN  81 MG CHEWABLE TABLET    Chew 81 mg by mouth daily.   ATORVASTATIN  (LIPITOR) 40 MG TABLET    Take 1 tablet (40 mg total) by mouth daily at 6 PM.   BICTEGRAVIR-EMTRICITABINE -TENOFOVIR  AF (BIKTARVY ) 50-200-25 MG TABS TABLET    Take 1 tablet by mouth daily.   CLOPIDOGREL  (PLAVIX ) 75 MG TABLET    Take 75 mg by mouth daily.   DIAZEPAM  (VALIUM ) 5 MG TABLET    Take 1 tablet (5 mg total) by mouth every 8 (eight) hours as needed for muscle spasms.   ESCITALOPRAM  (LEXAPRO ) 10 MG TABLET    Take 1 tablet (10 mg total) by mouth daily.   HYDROCORTISONE  (PROCTOSOL HC) 2.5 % RECTAL CREAM    Place 1 Application rectally 2 (two) times daily.   OXYCODONE -ACETAMINOPHEN  (PERCOCET) 5-325 MG TABLET    Take 1-2 tablets by mouth every 8 (eight) hours as needed.   TIZANIDINE  (ZANAFLEX ) 4 MG TABLET    Take 1 tablet (4 mg total) by mouth every 6 (six) hours as needed for muscle spasms.   TRAZODONE  (DESYREL ) 50 MG TABLET    Take 1-2 tablets (50-100 mg total) by mouth at bedtime.  Modified Medications   No medications on file  Discontinued Medications   No medications on file    Labs: Lab Results  Component Value Date   HIV1RNAQUANT  86 (H) 07/13/2021   HIV1RNAQUANT 23 (H) 04/29/2021   HIV1RNAQUANT 42 (H) 02/17/2021   CD4TABS 789 10/08/2020   CD4TABS 687 05/25/2020   CD4TABS 574 11/06/2019    RPR and STI Lab Results  Component Value Date   LABRPR NON-REACTIVE 10/08/2020   LABRPR REACTIVE (A) 07/31/2019   RPRTITER 1:1 (H) 07/31/2019    STI Results GC CT  12/30/2021 10:37 AM Negative  Negative   07/31/2019  9:22 AM Negative  Negative     Hepatitis B Lab Results  Component Value Date   HEPBSAB NON-REACTIVE 07/31/2019   HEPBSAG NON-REACTIVE 07/31/2019   HEPBCAB NON-REACTIVE 07/31/2019   Hepatitis C Lab Results  Component Value Date   HEPCAB NON-REACTIVE 07/31/2019   Hepatitis A Lab Results  Component Value Date   HAV NON-REACTIVE 07/31/2019   Lipids: Lab Results  Component Value Date   CHOL 90 07/31/2019   TRIG 61 07/31/2019   HDL 29 (L) 07/31/2019   CHOLHDL 3.1 07/31/2019   VLDL 36 06/28/2019   LDLCALC 47 07/31/2019    Current HIV Regimen: Prescribed Biktarvy   Assessment: Shawnmichael presents today for HIV follow up after missing several appointment over the last few years. He was last seen at our clinic by [facility] in January 2023. HIV viral load at that appointment  was 86; last CD4 count was in 2022 and was 789.  Labs:  ***  Eligible vaccinations:  Hepatitis A, HPV, Flu, COVID, Menveo, tdap, Shingles  Plan: ***  Elfrida Pixley L. Kellsey Sansone, PharmD, BCIDP, AAHIVP, CPP Clinical Pharmacist Practitioner - Infectious Diseases Clinical Pharmacist Lead - Specialty Pharmacy [facility]   "

## 2024-07-23 NOTE — Progress Notes (Unsigned)
 "  HPI: Jacob Holder is a 34 y.o. male who presents to the [facility] pharmacy clinic for HIV follow-up.  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   ASPIRIN  81 MG CHEWABLE TABLET    Chew 81 mg by mouth daily.   ATORVASTATIN  (LIPITOR) 40 MG TABLET    Take 1 tablet (40 mg total) by mouth daily at 6 PM.   BICTEGRAVIR-EMTRICITABINE -TENOFOVIR  AF (BIKTARVY ) 50-200-25 MG TABS TABLET    Take 1 tablet by mouth daily.   CLOPIDOGREL  (PLAVIX ) 75 MG TABLET    Take 75 mg by mouth daily.   DIAZEPAM  (VALIUM ) 5 MG TABLET    Take 1 tablet (5 mg total) by mouth every 8 (eight) hours as needed for muscle spasms.   ESCITALOPRAM  (LEXAPRO ) 10 MG TABLET    Take 1 tablet (10 mg total) by mouth daily.   HYDROCORTISONE  (PROCTOSOL HC) 2.5 % RECTAL CREAM    Place 1 Application rectally 2 (two) times daily.   OXYCODONE -ACETAMINOPHEN  (PERCOCET) 5-325 MG TABLET    Take 1-2 tablets by mouth every 8 (eight) hours as needed.   TIZANIDINE  (ZANAFLEX ) 4 MG TABLET    Take 1 tablet (4 mg total) by mouth every 6 (six) hours as needed for muscle spasms.   TRAZODONE  (DESYREL ) 50 MG TABLET    Take 1-2 tablets (50-100 mg total) by mouth at bedtime.  Modified Medications   No medications on file  Discontinued Medications   No medications on file    Labs: Lab Results  Component Value Date    HIV1RNAQUANT 86 (H) 07/13/2021   HIV1RNAQUANT 23 (H) 04/29/2021   HIV1RNAQUANT 42 (H) 02/17/2021   CD4TABS 789 10/08/2020   CD4TABS 687 05/25/2020   CD4TABS 574 11/06/2019    RPR and STI Lab Results  Component Value Date   LABRPR NON-REACTIVE 10/08/2020   LABRPR REACTIVE (A) 07/31/2019   RPRTITER 1:1 (H) 07/31/2019    STI Results GC CT  12/30/2021 10:37 AM Negative  Negative   07/31/2019  9:22 AM Negative  Negative     Hepatitis B Lab Results  Component Value Date   HEPBSAB NON-REACTIVE 07/31/2019   HEPBSAG NON-REACTIVE 07/31/2019   HEPBCAB NON-REACTIVE 07/31/2019   Hepatitis C Lab Results  Component Value Date   HEPCAB NON-REACTIVE 07/31/2019   Hepatitis A Lab Results  Component Value Date   HAV NON-REACTIVE 07/31/2019   Lipids: Lab Results  Component Value Date   CHOL 90 07/31/2019   TRIG 61 07/31/2019   HDL 29 (L) 07/31/2019   CHOLHDL 3.1 07/31/2019   VLDL 36 06/28/2019   LDLCALC 47 07/31/2019    Current HIV Regimen: Biktarvy   Assessment: Last seen in clinic on 07/13/21 with Corean. Has been well controlled on Biktarvy  up until that point since July 2022. Last recorded HIV RNA was on 07/13/21 at 86. Previous other labs  completed April 2022 include generally unremarkable CMP and CBC/differential and CD4 789. Last lipid panel was in 2021. No recent antiretroviral medication dispenses in chart review.   Labs:  Maintenance labs including HIV RNA, CD4 count, CMP, CBC, lipid panel Hep A/B serologies to confirm vaccines took  Eligible vaccinations:  MenACWY, Flu, COVID, Tdap, Shingles, +/- HPV  Plan: - Continue/restart Biktarvy  - Check labs as listed above - Will follow up in *** with ***  Jacob Holder, PharmD PGY1 Pharmacy Resident [facility] 07/23/2024 7:16 PM "

## 2024-07-24 ENCOUNTER — Ambulatory Visit: Admitting: Pharmacist

## 2024-07-24 DIAGNOSIS — B20 Human immunodeficiency virus [HIV] disease: Secondary | ICD-10-CM

## 2024-07-24 DIAGNOSIS — Z0000 Encounter for general adult medical examination without abnormal findings: Secondary | ICD-10-CM

## 2024-08-07 NOTE — Progress Notes (Unsigned)
 "  HPI: Jacob Holder is a 34 y.o. male who presents to the [facility] pharmacy clinic for HIV follow-up.  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   ASPIRIN  81 MG CHEWABLE TABLET    Chew 81 mg by mouth daily.   ATORVASTATIN  (LIPITOR) 40 MG TABLET    Take 1 tablet (40 mg total) by mouth daily at 6 PM.   BICTEGRAVIR-EMTRICITABINE -TENOFOVIR  AF (BIKTARVY ) 50-200-25 MG TABS TABLET    Take 1 tablet by mouth daily.   CLOPIDOGREL  (PLAVIX ) 75 MG TABLET    Take 75 mg by mouth daily.   DIAZEPAM  (VALIUM ) 5 MG TABLET    Take 1 tablet (5 mg total) by mouth every 8 (eight) hours as needed for muscle spasms.   ESCITALOPRAM  (LEXAPRO ) 10 MG TABLET    Take 1 tablet (10 mg total) by mouth daily.   HYDROCORTISONE  (PROCTOSOL HC) 2.5 % RECTAL CREAM    Place 1 Application rectally 2 (two) times daily.   OXYCODONE -ACETAMINOPHEN  (PERCOCET) 5-325 MG TABLET    Take 1-2 tablets by mouth every 8 (eight) hours as needed.   TIZANIDINE  (ZANAFLEX ) 4 MG TABLET    Take 1 tablet (4 mg total) by mouth every 6 (six) hours as needed for muscle spasms.   TRAZODONE  (DESYREL ) 50 MG TABLET    Take 1-2 tablets (50-100 mg total) by mouth at bedtime.  Modified Medications   No medications on file  Discontinued Medications   No medications on file    Labs: Lab Results  Component Value Date   HIV1RNAQUANT  86 (H) 07/13/2021   HIV1RNAQUANT 23 (H) 04/29/2021   HIV1RNAQUANT 42 (H) 02/17/2021   CD4TABS 789 10/08/2020   CD4TABS 687 05/25/2020   CD4TABS 574 11/06/2019    RPR and STI Lab Results  Component Value Date   LABRPR NON-REACTIVE 10/08/2020   LABRPR REACTIVE (A) 07/31/2019   RPRTITER 1:1 (H) 07/31/2019    STI Results GC CT  12/30/2021 10:37 AM Negative  Negative   07/31/2019  9:22 AM Negative  Negative     Hepatitis B Lab Results  Component Value Date   HEPBSAB NON-REACTIVE 07/31/2019   HEPBSAG NON-REACTIVE 07/31/2019   HEPBCAB NON-REACTIVE 07/31/2019   Hepatitis C Lab Results  Component Value Date   HEPCAB NON-REACTIVE 07/31/2019   Hepatitis A Lab Results  Component Value Date   HAV NON-REACTIVE 07/31/2019   Lipids: Lab Results  Component Value Date   CHOL 90 07/31/2019   TRIG 61 07/31/2019   HDL 29 (L) 07/31/2019   CHOLHDL 3.1 07/31/2019   VLDL 36 06/28/2019   LDLCALC 47 07/31/2019    Current HIV Regimen: ***  Assessment: ***  Labs:  ***  Eligible vaccinations:  ***  Plan: ***  Miho Monda L. Braxden Lovering, PharmD, BCIDP, AAHIVP, CPP Clinical Pharmacist Practitioner - Infectious Diseases Clinical Pharmacist Lead - [facility]ty [facility]   "

## 2024-08-08 ENCOUNTER — Other Ambulatory Visit: Payer: Self-pay

## 2024-08-08 ENCOUNTER — Other Ambulatory Visit (HOSPITAL_COMMUNITY): Admission: RE | Admit: 2024-08-08 | Source: Ambulatory Visit

## 2024-08-08 ENCOUNTER — Other Ambulatory Visit: Payer: Self-pay | Admitting: Pharmacist

## 2024-08-08 DIAGNOSIS — Z113 Encounter for screening for infections with a predominantly sexual mode of transmission: Secondary | ICD-10-CM

## 2024-08-08 DIAGNOSIS — Z79899 Other long term (current) drug therapy: Secondary | ICD-10-CM

## 2024-08-08 DIAGNOSIS — B20 Human immunodeficiency virus [HIV] disease: Secondary | ICD-10-CM

## 2024-08-09 LAB — CBC WITH DIFFERENTIAL/PLATELET
Absolute Lymphocytes: 2024 {cells}/uL (ref 850–3900)
Absolute Monocytes: 414 {cells}/uL (ref 200–950)
Basophils Absolute: 41 {cells}/uL (ref 0–200)
Basophils Relative: 0.9 %
Eosinophils Absolute: 110 {cells}/uL (ref 15–500)
Eosinophils Relative: 2.4 %
HCT: 42 % (ref 39.4–51.1)
Hemoglobin: 13.8 g/dL (ref 13.2–17.1)
MCH: 28.3 pg (ref 27.0–33.0)
MCHC: 32.9 g/dL (ref 31.6–35.4)
MCV: 86.1 fL (ref 81.4–101.7)
MPV: 14 fL — ABNORMAL HIGH (ref 7.5–12.5)
Monocytes Relative: 9 %
Neutro Abs: 2010 {cells}/uL (ref 1500–7800)
Neutrophils Relative %: 43.7 %
Platelets: 106 10*3/uL — ABNORMAL LOW (ref 140–400)
RBC: 4.88 Million/uL (ref 4.20–5.80)
RDW: 13.3 % (ref 11.0–15.0)
Total Lymphocyte: 44 %
WBC: 4.6 10*3/uL (ref 3.8–10.8)

## 2024-08-09 LAB — COMPREHENSIVE METABOLIC PANEL WITH GFR
AG Ratio: 1 (calc) (ref 1.0–2.5)
ALT: 35 U/L (ref 9–46)
AST: 48 U/L — ABNORMAL HIGH (ref 10–40)
Albumin: 4.3 g/dL (ref 3.6–5.1)
Alkaline phosphatase (APISO): 66 U/L (ref 36–130)
BUN: 10 mg/dL (ref 7–25)
CO2: 28 mmol/L (ref 20–32)
Calcium: 9.1 mg/dL (ref 8.6–10.3)
Chloride: 102 mmol/L (ref 98–110)
Creat: 0.7 mg/dL (ref 0.60–1.26)
Globulin: 4.5 g/dL — ABNORMAL HIGH (ref 1.9–3.7)
Glucose, Bld: 82 mg/dL (ref 65–99)
Potassium: 4 mmol/L (ref 3.5–5.3)
Sodium: 136 mmol/L (ref 135–146)
Total Bilirubin: 0.4 mg/dL (ref 0.2–1.2)
Total Protein: 8.8 g/dL — ABNORMAL HIGH (ref 6.1–8.1)
eGFR: 123 mL/min/{1.73_m2}

## 2024-08-09 LAB — URINE CYTOLOGY ANCILLARY ONLY
Chlamydia: NEGATIVE
Comment: NEGATIVE
Comment: NORMAL
Neisseria Gonorrhea: NEGATIVE

## 2024-08-09 LAB — T-HELPER CELLS (CD4) COUNT (NOT AT ARMC)
CD4 % Helper T Cell: 24 % — ABNORMAL LOW (ref 33–65)
CD4 T Cell Abs: 446 /uL (ref 400–1790)

## 2024-08-09 LAB — LIPID PANEL
Cholesterol: 164 mg/dL
HDL: 27 mg/dL — ABNORMAL LOW
LDL Cholesterol (Calc): 104 mg/dL — ABNORMAL HIGH
Non-HDL Cholesterol (Calc): 137 mg/dL — ABNORMAL HIGH
Total CHOL/HDL Ratio: 6.1 (calc) — ABNORMAL HIGH
Triglycerides: 212 mg/dL — ABNORMAL HIGH

## 2024-08-09 LAB — SYPHILIS: RPR W/REFLEX TO RPR TITER AND TREPONEMAL ANTIBODIES, TRADITIONAL SCREENING AND DIAGNOSIS ALGORITHM: RPR Ser Ql: REACTIVE — AB

## 2024-08-12 ENCOUNTER — Ambulatory Visit: Payer: Self-pay | Admitting: Pharmacist

## 2024-08-12 DIAGNOSIS — B20 Human immunodeficiency virus [HIV] disease: Secondary | ICD-10-CM

## 2024-08-12 DIAGNOSIS — Z79899 Other long term (current) drug therapy: Secondary | ICD-10-CM

## 2024-08-12 DIAGNOSIS — Z113 Encounter for screening for infections with a predominantly sexual mode of transmission: Secondary | ICD-10-CM
